# Patient Record
Sex: Female | Born: 1950 | Race: Black or African American | Hispanic: No | Marital: Single | Smoking: Former smoker
Health system: Southern US, Community
[De-identification: ages and names within clinical notes are randomized; demographics above are authoritative.]

## PROBLEM LIST (undated history)

## (undated) DIAGNOSIS — K219 Gastro-esophageal reflux disease without esophagitis: Secondary | ICD-10-CM

## (undated) DIAGNOSIS — M51379 Other intervertebral disc degeneration, lumbosacral region without mention of lumbar back pain or lower extremity pain: Secondary | ICD-10-CM

## (undated) DIAGNOSIS — R0600 Dyspnea, unspecified: Secondary | ICD-10-CM

## (undated) DIAGNOSIS — M4800 Spinal stenosis, site unspecified: Secondary | ICD-10-CM

## (undated) DIAGNOSIS — E119 Type 2 diabetes mellitus without complications: Secondary | ICD-10-CM

## (undated) DIAGNOSIS — M5137 Other intervertebral disc degeneration, lumbosacral region: Secondary | ICD-10-CM

## (undated) DIAGNOSIS — I10 Essential (primary) hypertension: Secondary | ICD-10-CM

## (undated) DIAGNOSIS — R609 Edema, unspecified: Secondary | ICD-10-CM

## (undated) DIAGNOSIS — Z87442 Personal history of urinary calculi: Secondary | ICD-10-CM

## (undated) DIAGNOSIS — M545 Low back pain, unspecified: Secondary | ICD-10-CM

## (undated) DIAGNOSIS — E785 Hyperlipidemia, unspecified: Secondary | ICD-10-CM

## (undated) DIAGNOSIS — D649 Anemia, unspecified: Secondary | ICD-10-CM

## (undated) DIAGNOSIS — M1990 Unspecified osteoarthritis, unspecified site: Secondary | ICD-10-CM

## (undated) DIAGNOSIS — K6289 Other specified diseases of anus and rectum: Secondary | ICD-10-CM

## (undated) DIAGNOSIS — J449 Chronic obstructive pulmonary disease, unspecified: Secondary | ICD-10-CM

## (undated) HISTORY — DX: Spinal stenosis, site unspecified: M48.00

## (undated) HISTORY — DX: Type 2 diabetes mellitus without complications: E11.9

## (undated) HISTORY — PX: LUMBAR EPIDURAL INJECTION: SHX1980

## (undated) HISTORY — DX: Other intervertebral disc degeneration, lumbosacral region: M51.37

## (undated) HISTORY — DX: Other intervertebral disc degeneration, lumbosacral region without mention of lumbar back pain or lower extremity pain: M51.379

## (undated) HISTORY — DX: Hyperlipidemia, unspecified: E78.5

## (undated) HISTORY — PX: REDUCTION MAMMAPLASTY: SUR839

## (undated) HISTORY — DX: Other specified diseases of anus and rectum: K62.89

## (undated) HISTORY — DX: Essential (primary) hypertension: I10

## (undated) HISTORY — DX: Low back pain: M54.5

## (undated) HISTORY — PX: OTHER SURGICAL HISTORY: SHX169

## (undated) HISTORY — PX: KNEE SURGERY: SHX244

## (undated) HISTORY — DX: Low back pain, unspecified: M54.50

---

## 1988-04-23 HISTORY — PX: OTHER SURGICAL HISTORY: SHX169

## 1998-02-22 ENCOUNTER — Other Ambulatory Visit: Admission: RE | Admit: 1998-02-22 | Discharge: 1998-02-22 | Payer: Self-pay | Admitting: *Deleted

## 1999-04-13 ENCOUNTER — Other Ambulatory Visit: Admission: RE | Admit: 1999-04-13 | Discharge: 1999-04-13 | Payer: Self-pay | Admitting: *Deleted

## 2002-10-12 ENCOUNTER — Other Ambulatory Visit: Admission: RE | Admit: 2002-10-12 | Discharge: 2002-10-12 | Payer: Self-pay | Admitting: *Deleted

## 2002-12-23 HISTORY — PX: BREAST REDUCTION SURGERY: SHX8

## 2003-01-11 ENCOUNTER — Ambulatory Visit (HOSPITAL_BASED_OUTPATIENT_CLINIC_OR_DEPARTMENT_OTHER): Admission: RE | Admit: 2003-01-11 | Discharge: 2003-01-12 | Payer: Self-pay | Admitting: Specialist

## 2003-01-12 ENCOUNTER — Encounter (INDEPENDENT_AMBULATORY_CARE_PROVIDER_SITE_OTHER): Payer: Self-pay | Admitting: Specialist

## 2003-01-12 ENCOUNTER — Emergency Department (HOSPITAL_COMMUNITY): Admission: EM | Admit: 2003-01-12 | Discharge: 2003-01-12 | Payer: Self-pay | Admitting: Emergency Medicine

## 2003-09-14 ENCOUNTER — Encounter: Admission: RE | Admit: 2003-09-14 | Discharge: 2003-12-13 | Payer: Self-pay | Admitting: Orthopedic Surgery

## 2004-03-31 ENCOUNTER — Ambulatory Visit (HOSPITAL_COMMUNITY): Admission: RE | Admit: 2004-03-31 | Discharge: 2004-03-31 | Payer: Self-pay | Admitting: Obstetrics

## 2005-03-29 ENCOUNTER — Encounter: Admission: RE | Admit: 2005-03-29 | Discharge: 2005-03-29 | Payer: Self-pay | Admitting: Obstetrics

## 2006-04-10 ENCOUNTER — Ambulatory Visit (HOSPITAL_COMMUNITY): Admission: RE | Admit: 2006-04-10 | Discharge: 2006-04-10 | Payer: Self-pay | Admitting: Obstetrics

## 2007-05-28 ENCOUNTER — Ambulatory Visit (HOSPITAL_COMMUNITY): Admission: RE | Admit: 2007-05-28 | Discharge: 2007-05-28 | Payer: Self-pay | Admitting: Obstetrics

## 2007-09-22 ENCOUNTER — Ambulatory Visit (HOSPITAL_COMMUNITY): Admission: RE | Admit: 2007-09-22 | Discharge: 2007-09-22 | Payer: Self-pay | Admitting: Obstetrics

## 2010-03-21 ENCOUNTER — Ambulatory Visit (HOSPITAL_COMMUNITY)
Admission: RE | Admit: 2010-03-21 | Discharge: 2010-03-21 | Payer: Self-pay | Source: Home / Self Care | Admitting: Family Medicine

## 2010-09-08 NOTE — Op Note (Signed)
NAME:  DELARA, SHEPHEARD NO.:  [id number]   MEDICAL RECORD NO.:  [id number]                   PATIENT TYPE:  AMB   LOCATION:  DSC                                  FACILITY:  [facility]   PHYSICIAN:  Earvin Hansen L. Shon Hough, M.D.           DATE OF BIRTH:  Apr 25, 1950   DATE OF PROCEDURE:  01/11/2003  DATE OF DISCHARGE:                                 OPERATIVE REPORT   INDICATION:  This is a 60 year old lady with severe macromastia, back and  shoulder pain secondary to large pendulous breasts, history of intertrigo,  pitting of both shoulders and accessory breast tissue.   PROCEDURES DONE:  Bilateral breast reductions and reduction of accessory  breast tissue.   SURGEON:  Yaakov Guthrie. Shon Hough, M.D.   ASSISTANT:  Alethia Berthold, CFA, C-PAC.   ANESTHESIA:  General.   PROCEDURE:  The patient was sat up and drawn for the inferior pedicle  reduction mammoplasties, remarking the nipple-areolar complexes back to 20  cm from the suprasternal notch.  She then underwent general anesthesia and  intubated orally.  Prep was done to the chest and breast areas in a routine  fashion using Betadine soaping solution and walled off with sterile towels  and drapes so as to make a sterile field.  Xylocaine 0.25% with epinephrine  was injected locally for vasoconstriction, a total of 200 mL, 1:400,000  concentration.  After sterile drapes had been placed, the wounds were scored  with #15 blades and the skin over the inferior pedicles was de-  epithelialized with #20 blades.  The medial and lateral fatty dermal  pedicles were excised down to underlying fascia.  Out laterally, more tissue  was taken and accessory breast tissue excised sharply using the Bovie unit  on coagulation.  After proper hemostasis, the new keyhole areas were  debulked and then the flaps were transposed and stayed with 3-0 Prolene  suture with excellent symmetry.  The same procedure was carried out on both  sides.  Subcutaneous closure was done with 3-0 Monocryl x2 layers and a  running subcuticular suture of 3-0 Monocryl and 5-0 Monocryl throughout the  inverted T's.  The wounds were drained with #10 Blake drains, which were  placed in the depths of the wounds and brought out through the lateral-most  portions of the incisions and secured with 3-0 Prolene.  The wounds were  cleansed.  Steri-Strips and soft dressings were applied including Xeroform,  4 x 4's, ABDs and Hypafix tape.  Estimated blood loss -- 150 mL.  Complications -- none.  At the end of the procedure the nipple-areolar  complexes were examined, showing good suppleness and blood supply.  She was  then taken to recovery in excellent condition.  Yaakov Guthrie. Shon Hough, M.D.    Cathie Hoops  D:  01/11/2003  T:  01/11/2003  Job:  [id number]

## 2011-03-28 ENCOUNTER — Other Ambulatory Visit: Payer: Self-pay | Admitting: Obstetrics

## 2011-03-28 DIAGNOSIS — Z1231 Encounter for screening mammogram for malignant neoplasm of breast: Secondary | ICD-10-CM

## 2011-05-03 ENCOUNTER — Ambulatory Visit (HOSPITAL_COMMUNITY)
Admission: RE | Admit: 2011-05-03 | Discharge: 2011-05-03 | Disposition: A | Discharge status: Home / Self Care | Payer: No Typology Code available for payment source | Source: Ambulatory Visit | Attending: Obstetrics | Admitting: Obstetrics

## 2011-05-03 DIAGNOSIS — Z1231 Encounter for screening mammogram for malignant neoplasm of breast: Secondary | ICD-10-CM | POA: Insufficient documentation

## 2012-02-19 ENCOUNTER — Other Ambulatory Visit (HOSPITAL_COMMUNITY): Payer: Self-pay | Admitting: Endocrinology

## 2012-02-19 DIAGNOSIS — Z1231 Encounter for screening mammogram for malignant neoplasm of breast: Secondary | ICD-10-CM

## 2012-03-06 ENCOUNTER — Encounter: Payer: Self-pay | Admitting: Internal Medicine

## 2012-04-18 ENCOUNTER — Ambulatory Visit (AMBULATORY_SURGERY_CENTER): Payer: No Typology Code available for payment source

## 2012-04-18 VITALS — Ht 67.0 in | Wt 253.0 lb

## 2012-04-18 DIAGNOSIS — Z1211 Encounter for screening for malignant neoplasm of colon: Secondary | ICD-10-CM

## 2012-04-18 MED ORDER — MOVIPREP 100 G PO SOLR: 1.0000 | Freq: Once | ORAL | Status: DC

## 2012-05-01 ENCOUNTER — Telehealth: Payer: Self-pay | Admitting: Internal Medicine

## 2012-05-01 NOTE — Telephone Encounter (Signed)
Pt instructed to hold Diovan/HCT and Metformin day of procedure.

## 2012-05-02 ENCOUNTER — Encounter: Payer: Self-pay | Admitting: Internal Medicine

## 2012-05-02 ENCOUNTER — Ambulatory Visit (AMBULATORY_SURGERY_CENTER): Payer: No Typology Code available for payment source | Admitting: Internal Medicine

## 2012-05-02 VITALS — BP 118/90 | HR 86 | Temp 97.3°F | Resp 22 | Ht 67.0 in | Wt 253.0 lb

## 2012-05-02 DIAGNOSIS — Z1211 Encounter for screening for malignant neoplasm of colon: Secondary | ICD-10-CM

## 2012-05-02 DIAGNOSIS — D126 Benign neoplasm of colon, unspecified: Secondary | ICD-10-CM

## 2012-05-02 LAB — GLUCOSE, CAPILLARY
Glucose-Capillary: 118 mg/dL — ABNORMAL HIGH (ref 70–99)
Glucose-Capillary: 106 mg/dL — ABNORMAL HIGH (ref 70–99)
Glucose-Capillary: 109 mg/dL — ABNORMAL HIGH (ref 70–99)

## 2012-05-02 MED ORDER — SODIUM CHLORIDE 0.9 % IV SOLN: 500.0000 mL | INTRAVENOUS | Status: DC

## 2012-05-02 NOTE — Patient Instructions (Addendum)

## 2012-05-02 NOTE — Progress Notes (Signed)
1102 a/ox3 pleased with MAC report to April RN

## 2012-05-02 NOTE — Op Note (Signed)
 [facility] [address] [place], [address]   COLONOSCOPY PROCEDURE REPORT  PATIENT: Stephanie, Fox  MR#: [id number] BIRTHDATE: January 29, 1951 , 61  yrs. old GENDER: Female ENDOSCOPIST: Hart Carwin, MD REFERRED BY:  Coral Ceo, M.D. , Dr Talmage Nap PROCEDURE DATE:  05/02/2012 PROCEDURE:   Colonoscopy with snare polypectomy ASA CLASS:   Class II INDICATIONS:Average risk patient for colon cancer. MEDICATIONS: MAC sedation, administered by CRNA, Propofol (Diprivan), and Propofol (Diprivan) 180 mg IV  DESCRIPTION OF PROCEDURE:   After the risks and benefits and of the procedure were explained, informed consent was obtained.  A digital rectal exam revealed no abnormalities of the rectum.    The LB [id number]-[id number]  endoscope was introduced through the anus and advanced to the cecum, which was identified by both the appendix and ileocecal valve .  The quality of the prep was good, using MoviPrep .  The instrument was then slowly withdrawn as the colon was fully examined.     COLON FINDINGS: A flat polyp ranging between 3-66mm in size was found in the sigmoid colon.  A polypectomy was performed with a cold snare.  The resection was complete and the polyp tissue was completely retrieved.     Retroflexed views revealed no abnormalities.     The scope was then withdrawn from the patient and the procedure completed.  COMPLICATIONS: There were no complications. ENDOSCOPIC IMPRESSION: Flat polyp ranging between 3-29mm in size was found in the sigmoid colon; polypectomy was performed with a cold snare  RECOMMENDATIONS: 1.  Await pathology results 2.  High fiber diet   REPEAT EXAM: for Colonoscopy. depending on the polyp pathology  cc:  _______________________________ eSigned:  Hart Carwin, MD 05/02/2012 11:04 AM

## 2012-05-02 NOTE — Progress Notes (Signed)
Patient did not experience any of the following events: a burn prior to discharge; a fall within the facility; wrong site/side/patient/procedure/implant event; or a hospital transfer or hospital admission upon discharge from the facility. (G8907) Patient did not have preoperative order for IV antibiotic SSI prophylaxis. (G8918)  

## 2012-05-05 ENCOUNTER — Telehealth: Payer: Self-pay | Admitting: *Deleted

## 2012-05-05 ENCOUNTER — Ambulatory Visit (HOSPITAL_COMMUNITY)
Admission: RE | Admit: 2012-05-05 | Discharge: 2012-05-05 | Disposition: A | Discharge status: Home / Self Care | Payer: No Typology Code available for payment source | Source: Ambulatory Visit | Attending: Endocrinology | Admitting: Endocrinology

## 2012-05-05 DIAGNOSIS — Z1231 Encounter for screening mammogram for malignant neoplasm of breast: Secondary | ICD-10-CM | POA: Insufficient documentation

## 2012-05-05 NOTE — Telephone Encounter (Signed)
  Follow up Call-  Call back number 05/02/2012  Post procedure Call Back phone  # [phone] or  [phone]  Permission to leave phone message Yes     Patient questions:  Do you have a fever, pain , or abdominal swelling? no Pain Score  0 *  Have you tolerated food without any problems? yes  Have you been able to return to your normal activities? yes  Do you have any questions about your discharge instructions: Diet   no Medications  no Follow up visit  no  Do you have questions or concerns about your Care? yes  Actions: * If pain score is 4 or above: No action needed, pain <4.

## 2012-05-06 ENCOUNTER — Encounter: Payer: Self-pay | Admitting: Internal Medicine

## 2013-01-05 ENCOUNTER — Other Ambulatory Visit: Payer: Self-pay | Admitting: Obstetrics

## 2013-01-05 ENCOUNTER — Encounter: Payer: Self-pay | Admitting: Obstetrics

## 2013-01-05 DIAGNOSIS — K819 Cholecystitis, unspecified: Secondary | ICD-10-CM

## 2013-01-06 ENCOUNTER — Ambulatory Visit: Payer: Self-pay | Admitting: Obstetrics

## 2013-01-09 ENCOUNTER — Encounter (INDEPENDENT_AMBULATORY_CARE_PROVIDER_SITE_OTHER): Payer: Self-pay | Admitting: General Surgery

## 2013-01-09 ENCOUNTER — Ambulatory Visit (INDEPENDENT_AMBULATORY_CARE_PROVIDER_SITE_OTHER): Payer: No Typology Code available for payment source | Admitting: General Surgery

## 2013-01-09 VITALS — BP 128/82 | HR 84 | Temp 98.6°F | Ht 67.0 in | Wt 252.0 lb

## 2013-01-09 DIAGNOSIS — R1011 Right upper quadrant pain: Secondary | ICD-10-CM

## 2013-01-09 NOTE — Patient Instructions (Signed)
I will give you a call after your ultrasound so we may discuss the results

## 2013-01-09 NOTE — Progress Notes (Signed)
Patient ID: Stephanie Fox, female   DOB: 10-Jul-1950, 62 y.o.   MRN: [id number]  Chief Complaint  Patient presents with  . Abdominal Pain    HPI Stephanie Fox is a 62 y.o. female.  Chief complaint: Right upper quadrant abdominal pain and reflux HPI A few weeks ago the patient developed pain in her left ankle. This may be difficult for her to take a shower for a couple of days. Shortly thereafter, she developed pain in the right side of her abdomen. It started low and localized in her right upper quadrant. It lasted for a few days. It resolved after taking some ibuprofen. The pain was sharp and exacerbated by deep inspiration. Laying on her left side also made it worse. In the interim, the pain has not returned, however, she has increased symptoms of heartburn and reflux. I was asked to see her in consultation by Dr. Coral Ceo regarding possible gallbladder disease. Past Medical History  Diagnosis Date  . Diabetes mellitus   . Hypertension   . Hyperlipidemia     Past Surgical History  Procedure Laterality Date  . Breast reduction surgery    . Thumb surgery    . Underarm surgery    . Knee surgery      Family History  Problem Relation Age of Onset  . Colon cancer Neg Hx     Social History History  Substance Use Topics  . Smoking status: Current Every Day Smoker -- 0.25 packs/day    Types: Cigarettes  . Smokeless tobacco: Not on file  . Alcohol Use: No    No Known Allergies  Current Outpatient Prescriptions  Medication Sig Dispense Refill  . atorvastatin (LIPITOR) 10 MG tablet Take 10 mg by mouth daily.      . benzonatate (TESSALON) 100 MG capsule       . Capsicum, Cayenne, (CAYENNE PO) Take by mouth.      . Cholecalciferol (VITAMIN D3) 2000 UNITS TABS Take by mouth.      Marland Kitchen CINNAMON PO Take 1,000 mg by mouth 2 (two) times daily.      . furosemide (LASIX) 40 MG tablet Take 40 mg by mouth as needed.      . metFORMIN (GLUCOPHAGE) 500 MG tablet Take 500 mg by mouth 2 (two)  times daily with a meal.      . Multiple Vitamin (MULTIVITAMIN) tablet Take 1 tablet by mouth daily.      . Multiple Vitamins-Minerals (HAIR VITAMINS PO) Take by mouth 2 (two) times daily.      . valsartan-hydrochlorothiazide (DIOVAN-HCT) 80-12.5 MG per tablet Take 1 tablet by mouth daily.       No current facility-administered medications for this visit.    Review of Systems Review of Systems  Constitutional: Negative for fever, chills and unexpected weight change.  HENT: Negative for hearing loss, congestion, sore throat, trouble swallowing and voice change.   Eyes: Negative for visual disturbance.  Respiratory: Negative for cough and wheezing.   Cardiovascular: Negative for chest pain, palpitations and leg swelling.  Gastrointestinal: Positive for abdominal pain. Negative for nausea, vomiting, diarrhea, constipation, blood in stool, abdominal distention and anal bleeding.       See history of present illness  Genitourinary: Negative for hematuria, vaginal bleeding and difficulty urinating.  Musculoskeletal: Negative for arthralgias.       Recent left ankle pain which resolved  Skin: Negative for rash and wound.  Neurological: Negative for seizures, syncope and headaches.  Hematological: Negative for adenopathy. Does not  bruise/bleed easily.  Psychiatric/Behavioral: Negative for confusion.    Blood pressure 128/82, pulse 84, temperature 98.6 F (37 C), temperature source Oral, height 5\' 7"  (1.702 m), weight 252 lb (114.306 kg).  Physical Exam Physical Exam  Constitutional: She is oriented to person, place, and time. She appears well-developed and well-nourished. No distress.  HENT:  Head: Normocephalic and atraumatic.  Nose: Nose normal.  Mouth/Throat: Oropharynx is clear and moist. No oropharyngeal exudate.  Eyes: EOM are normal. Pupils are equal, round, and reactive to light. No scleral icterus.  Neck: Normal range of motion. Neck supple. No tracheal deviation present.   Cardiovascular: Normal rate, regular rhythm, normal heart sounds and intact distal pulses.   Pulmonary/Chest: Breath sounds normal. No stridor. No respiratory distress. She has no wheezes. She has no rales. She exhibits no tenderness.  Abdominal: Soft. She exhibits no distension. There is tenderness. There is no rebound and no guarding.  Minimal tenderness to deep palpation right upper quadrant  Musculoskeletal: Normal range of motion. She exhibits no edema and no tenderness.  Neurological: She is alert and oriented to person, place, and time. She exhibits normal muscle tone. Coordination normal.  Skin: Skin is warm.  Psychiatric: She has a normal mood and affect.    Data Reviewed N/A  Assessment    Right upper quadrant abdominal pain, possible biliary colic With associated reflux symptoms    Plan    I will send her for abdominal ultrasound to evaluate for gallstones. I will call her with the results and we will discuss further plans at that time. We did briefly discuss gallbladder surgery. I also gave her some literature to read. We will speak on the phone after her ultrasound and make plans accordingly.       Aysha Livecchi E 01/09/2013, 3:06 PM

## 2013-01-13 ENCOUNTER — Ambulatory Visit
Admission: RE | Admit: 2013-01-13 | Discharge: 2013-01-13 | Disposition: A | Discharge status: Home / Self Care | Payer: No Typology Code available for payment source | Source: Ambulatory Visit | Attending: General Surgery | Admitting: General Surgery

## 2013-01-13 ENCOUNTER — Other Ambulatory Visit: Payer: Self-pay | Admitting: General Surgery

## 2013-01-13 DIAGNOSIS — R1011 Right upper quadrant pain: Secondary | ICD-10-CM

## 2013-01-20 ENCOUNTER — Encounter (HOSPITAL_COMMUNITY)
Admission: RE | Admit: 2013-01-20 | Discharge: 2013-01-20 | Disposition: A | Discharge status: Home / Self Care | Payer: No Typology Code available for payment source | Source: Ambulatory Visit | Attending: General Surgery | Admitting: General Surgery

## 2013-01-20 ENCOUNTER — Telehealth: Payer: Self-pay | Admitting: General Surgery

## 2013-01-20 DIAGNOSIS — R1011 Right upper quadrant pain: Secondary | ICD-10-CM | POA: Insufficient documentation

## 2013-01-20 MED ORDER — TECHNETIUM TC 99M MEBROFENIN IV KIT
5.0000 | PACK | Freq: Once | INTRAVENOUS | Status: AC | PRN
  Administered 2013-01-20: 5 via INTRAVENOUS

## 2013-01-20 NOTE — Telephone Encounter (Signed)
Left MOM with HIDA results

## 2013-06-03 ENCOUNTER — Other Ambulatory Visit: Payer: Self-pay | Admitting: Obstetrics

## 2013-06-03 DIAGNOSIS — Z1231 Encounter for screening mammogram for malignant neoplasm of breast: Secondary | ICD-10-CM

## 2013-06-17 ENCOUNTER — Ambulatory Visit (HOSPITAL_COMMUNITY)
Admission: RE | Admit: 2013-06-17 | Discharge: 2013-06-17 | Disposition: A | Discharge status: Home / Self Care | Payer: PRIVATE HEALTH INSURANCE | Source: Ambulatory Visit | Attending: Obstetrics | Admitting: Obstetrics

## 2013-06-17 ENCOUNTER — Encounter: Payer: Self-pay | Admitting: Obstetrics

## 2013-06-17 ENCOUNTER — Ambulatory Visit (INDEPENDENT_AMBULATORY_CARE_PROVIDER_SITE_OTHER): Payer: PRIVATE HEALTH INSURANCE | Admitting: Obstetrics

## 2013-06-17 VITALS — BP 104/74 | HR 87 | Temp 98.7°F | Ht 67.0 in | Wt 246.0 lb

## 2013-06-17 DIAGNOSIS — Z0000 Encounter for general adult medical examination without abnormal findings: Secondary | ICD-10-CM

## 2013-06-17 DIAGNOSIS — Z1231 Encounter for screening mammogram for malignant neoplasm of breast: Secondary | ICD-10-CM | POA: Insufficient documentation

## 2013-06-17 DIAGNOSIS — Z01419 Encounter for gynecological examination (general) (routine) without abnormal findings: Secondary | ICD-10-CM

## 2013-06-17 DIAGNOSIS — Z780 Asymptomatic menopausal state: Secondary | ICD-10-CM

## 2013-06-17 NOTE — Progress Notes (Signed)
Subjective:     Stephanie Fox is a 63 y.o. female here for a routine exam.  Current complaints: Patient is in the office today for an Annual Exam. Patient states she is taking a new medication Ivokana. and would like to make sure that she does not have a yeast infection or a UTI. Patient states has some slight itching. Patient denies any burning, irritation, vaginal odor or discharge.  Personal health questionnaire reviewed: yes.   Gynecologic History No LMP recorded. Patient is postmenopausal. Contraception: post menopausal status Last Pap: 05/07/2012. Results were: normal Last mammogram: 04/2012. Results were: normal  Obstetric History OB History  No data available     The following portions of the patient's history were reviewed and updated as appropriate: allergies, current medications, past family history, past medical history, past social history, past surgical history and problem list.  Review of Systems Pertinent items are noted in HPI.    Objective:    General appearance: alert and no distress Breasts: normal appearance, no masses or tenderness Abdomen: normal findings: soft, non-tender Pelvic: cervix normal in appearance, external genitalia normal, no adnexal masses or tenderness, no cervical motion tenderness, rectovaginal septum normal, uterus normal size, shape, and consistency and vagina normal without discharge    Assessment:    Healthy female exam.    Plan:    Education reviewed: calcium supplements, low fat, low cholesterol diet, safe sex/STD prevention, self breast exams, weight bearing exercise and Bone Density testing. Mammogram ordered. Follow up in: 1 year. Bone Density Study scheduled.

## 2013-06-18 LAB — PAP IG W/ RFLX HPV ASCU

## 2013-06-18 LAB — WET PREP BY MOLECULAR PROBE
CANDIDA SPECIES: NEGATIVE
Gardnerella vaginalis: NEGATIVE
TRICHOMONAS VAG: NEGATIVE

## 2013-06-19 LAB — URINE CULTURE
Colony Count: NO GROWTH
Organism ID, Bacteria: NO GROWTH

## 2014-02-22 ENCOUNTER — Encounter: Payer: Self-pay | Admitting: Obstetrics

## 2014-05-20 ENCOUNTER — Other Ambulatory Visit: Payer: Self-pay | Admitting: Obstetrics

## 2014-05-20 DIAGNOSIS — Z1231 Encounter for screening mammogram for malignant neoplasm of breast: Secondary | ICD-10-CM

## 2014-06-17 ENCOUNTER — Ambulatory Visit: Payer: PRIVATE HEALTH INSURANCE | Admitting: Obstetrics

## 2014-06-18 ENCOUNTER — Ambulatory Visit (INDEPENDENT_AMBULATORY_CARE_PROVIDER_SITE_OTHER): Payer: PRIVATE HEALTH INSURANCE | Admitting: Obstetrics

## 2014-06-18 ENCOUNTER — Ambulatory Visit (HOSPITAL_COMMUNITY)
Admission: RE | Admit: 2014-06-18 | Discharge: 2014-06-18 | Disposition: A | Discharge status: Home / Self Care | Payer: PRIVATE HEALTH INSURANCE | Source: Ambulatory Visit | Attending: Obstetrics | Admitting: Obstetrics

## 2014-06-18 ENCOUNTER — Encounter: Payer: Self-pay | Admitting: Obstetrics

## 2014-06-18 VITALS — BP 130/87 | HR 93 | Temp 98.4°F | Ht 67.0 in | Wt 265.0 lb

## 2014-06-18 DIAGNOSIS — Z1231 Encounter for screening mammogram for malignant neoplasm of breast: Secondary | ICD-10-CM | POA: Diagnosis not present

## 2014-06-18 DIAGNOSIS — Z01419 Encounter for gynecological examination (general) (routine) without abnormal findings: Secondary | ICD-10-CM

## 2014-06-18 NOTE — Progress Notes (Signed)
Subjective:        Stephanie Fox is a 64 y.o. female here for a routine exam.  Current complaints: None.    Personal health questionnaire:  Is patient [name], have a family history of breast and/or ovarian cancer: no Is there a family history of uterine cancer diagnosed at age < 88, gastrointestinal cancer, urinary tract cancer, family member who is a Field seismologist syndrome-associated carrier: no Is the patient overweight and hypertensive, family history of diabetes, personal history of gestational diabetes, preeclampsia or PCOS: no Is patient over 42, have PCOS,  family history of premature CHD under age 52, diabetes, smoke, have hypertension or peripheral artery disease:  no At any time, has a partner hit, kicked or otherwise hurt or frightened you?: no Over the past 2 weeks, have you felt down, depressed or hopeless?: no Over the past 2 weeks, have you felt little interest or pleasure in doing things?:no   Gynecologic History No LMP recorded. Patient is postmenopausal. Contraception: post menopausal status Last Pap: 2015. Results were: normal Last mammogram: 2015. Results were: normal  Obstetric History OB History  Gravida Para Term Preterm AB SAB TAB Ectopic Multiple Living  2 0 0 0 2 0 2 0 0 0     # Outcome Date GA Lbr Len/2nd Weight Sex Delivery Anes PTL Lv  2 TAB           1 TAB               Past Medical History  Diagnosis Date  . Diabetes mellitus   . Hypertension   . Hyperlipidemia     Past Surgical History  Procedure Laterality Date  . Breast reduction surgery    . Thumb surgery    . Underarm surgery    . Knee surgery       Current outpatient prescriptions:  .  benzonatate (TESSALON) 100 MG capsule, , Disp: , Rfl:  .  Cholecalciferol (VITAMIN D3) 2000 UNITS TABS, Take by mouth., Disp: , Rfl:  .  furosemide (LASIX) 40 MG tablet, Take 40 mg by mouth as needed., Disp: , Rfl:  .  metFORMIN (GLUCOPHAGE) 500 MG tablet, Take 500 mg by mouth 2 (two) times  daily with a meal., Disp: , Rfl:  .  Multiple Vitamin (MULTIVITAMIN) tablet, Take 1 tablet by mouth daily., Disp: , Rfl:  .  valsartan-hydrochlorothiazide (DIOVAN-HCT) 80-12.5 MG per tablet, Take 1 tablet by mouth daily., Disp: , Rfl:  No Known Allergies  History  Substance Use Topics  . Smoking status: Current Every Day Smoker -- 0.25 packs/day    Types: Cigarettes  . Smokeless tobacco: Never Used  . Alcohol Use: No    Family History  Problem Relation Age of Onset  . Colon cancer Neg Hx       Review of Systems  Constitutional: negative for fatigue and weight loss Respiratory: negative for cough and wheezing Cardiovascular: negative for chest pain, fatigue and palpitations Gastrointestinal: negative for abdominal pain and change in bowel habits Musculoskeletal:negative for myalgias Neurological: negative for gait problems and tremors Behavioral/Psych: negative for abusive relationship, depression Endocrine: negative for temperature intolerance   Genitourinary:negative for abnormal menstrual periods, genital lesions, hot flashes, sexual problems and vaginal discharge Integument/breast: negative for breast lump, breast tenderness, nipple discharge and skin lesion(s)    Objective:       BP 130/87 mmHg  Pulse 93  Temp(Src) 98.4 F (36.9 C)  Ht 5\' 7"  (1.702 m)  Wt 265 lb (120.203 kg)  BMI 41.50 kg/m2 General:   alert  Skin:   no rash or abnormalities  Lungs:   clear to auscultation bilaterally  Heart:   regular rate and rhythm, S1, S2 normal, no murmur, click, rub or gallop  Breasts:   normal without suspicious masses, skin or nipple changes or axillary nodes  Abdomen:  normal findings: no organomegaly, soft, non-tender and no hernia  Pelvis:  External genitalia: normal general appearance Urinary system: urethral meatus normal and bladder without fullness, nontender Vaginal: normal without tenderness, induration or masses Cervix: normal appearance Adnexa: normal bimanual  exam Uterus: anteverted and non-tender, normal size   Lab Review Urine pregnancy test Labs reviewed yes Radiologic studies reviewed yes    Assessment:    Healthy female exam.    Plan:    Education reviewed: calcium supplements, low fat, low cholesterol diet, safe sex/STD prevention, self breast exams, smoking cessation and weight bearing exercise. Mammogram ordered. Follow up in: 1 year.   No orders of the defined types were placed in this encounter.   No orders of the defined types were placed in this encounter.

## 2014-06-18 NOTE — Addendum Note (Signed)
Addended by: Ladona Ridgel on: 06/18/2014 12:00 PM   Modules accepted: Orders

## 2014-06-21 LAB — PAP IG AND HPV HIGH-RISK: HPV DNA HIGH RISK: NOT DETECTED

## 2015-04-24 HISTORY — PX: JOINT REPLACEMENT: SHX530

## 2015-06-02 ENCOUNTER — Other Ambulatory Visit: Payer: Self-pay

## 2015-06-02 DIAGNOSIS — Z1231 Encounter for screening mammogram for malignant neoplasm of breast: Secondary | ICD-10-CM

## 2015-06-28 ENCOUNTER — Ambulatory Visit
Admission: RE | Admit: 2015-06-28 | Discharge: 2015-06-28 | Disposition: A | Discharge status: Home / Self Care | Payer: No Typology Code available for payment source | Source: Ambulatory Visit

## 2015-06-28 DIAGNOSIS — Z1231 Encounter for screening mammogram for malignant neoplasm of breast: Secondary | ICD-10-CM

## 2015-07-21 ENCOUNTER — Ambulatory Visit (INDEPENDENT_AMBULATORY_CARE_PROVIDER_SITE_OTHER): Payer: No Typology Code available for payment source | Admitting: Obstetrics

## 2015-07-21 ENCOUNTER — Encounter: Payer: Self-pay | Admitting: Obstetrics

## 2015-07-21 VITALS — BP 113/76 | HR 88 | Ht 65.0 in | Wt 244.0 lb

## 2015-07-21 DIAGNOSIS — Z01419 Encounter for gynecological examination (general) (routine) without abnormal findings: Secondary | ICD-10-CM | POA: Diagnosis not present

## 2015-07-21 NOTE — Progress Notes (Signed)
Subjective:        Stephanie Fox is a 65 y.o. female here for a routine exam.  Current complaints: None.    Personal health questionnaire:  Is patient [name], have a family history of breast and/or ovarian cancer: no Is there a family history of uterine cancer diagnosed at age < 28, gastrointestinal cancer, urinary tract cancer, family member who is a Field seismologist syndrome-associated carrier: no Is the patient overweight and hypertensive, family history of diabetes, personal history of gestational diabetes, preeclampsia or PCOS: no Is patient over 33, have PCOS,  family history of premature CHD under age 1, diabetes, smoke, have hypertension or peripheral artery disease:  no At any time, has a partner hit, kicked or otherwise hurt or frightened you?: no Over the past 2 weeks, have you felt down, depressed or hopeless?: no Over the past 2 weeks, have you felt little interest or pleasure in doing things?:no   Gynecologic History No LMP recorded. Patient is postmenopausal. Contraception: post menopausal status Last Pap: 2016. Results were: normal Last mammogram: 2017. Results were: normal  Obstetric History OB History  Gravida Para Term Preterm AB SAB TAB Ectopic Multiple Living  2 0 0 0 2 0 2 0 0 0     # Outcome Date GA Lbr Len/2nd Weight Sex Delivery Anes PTL Lv  2 TAB           1 TAB               Past Medical History  Diagnosis Date  . Diabetes mellitus ([place])   . Hypertension   . Hyperlipidemia     Past Surgical History  Procedure Laterality Date  . Breast reduction surgery    . Thumb surgery    . Underarm surgery    . Knee surgery       Current outpatient prescriptions:  .  benzonatate (TESSALON) 100 MG capsule, , Disp: , Rfl:  .  Cholecalciferol (VITAMIN D3) 2000 UNITS TABS, Take by mouth., Disp: , Rfl:  .  furosemide (LASIX) 40 MG tablet, Take 40 mg by mouth as needed., Disp: , Rfl:  .  Liraglutide (VICTOZA) 18 MG/3ML SOPN, Inject into the skin., Disp: ,  Rfl:  .  metFORMIN (GLUCOPHAGE) 500 MG tablet, Take 500 mg by mouth 2 (two) times daily with a meal., Disp: , Rfl:  .  Omega-3 Fatty Acids (FISH OIL) 1000 MG CAPS, Take by mouth., Disp: , Rfl:  .  valsartan-hydrochlorothiazide (DIOVAN-HCT) 80-12.5 MG per tablet, Take 1 tablet by mouth daily., Disp: , Rfl:  .  Multiple Vitamin (MULTIVITAMIN) tablet, Take 1 tablet by mouth daily. Reported on 07/21/2015, Disp: , Rfl:  No Known Allergies  Social History  Substance Use Topics  . Smoking status: Current Every Day Smoker -- 0.25 packs/day    Types: Cigarettes  . Smokeless tobacco: Never Used  . Alcohol Use: No    Family History  Problem Relation Age of Onset  . Colon cancer Neg Hx       Review of Systems  Constitutional: negative for fatigue and weight loss Respiratory: negative for cough and wheezing Cardiovascular: negative for chest pain, fatigue and palpitations Gastrointestinal: negative for abdominal pain and change in bowel habits Musculoskeletal:negative for myalgias Neurological: negative for gait problems and tremors Behavioral/Psych: negative for abusive relationship, depression Endocrine: negative for temperature intolerance   Genitourinary:negative for abnormal menstrual periods, genital lesions, hot flashes, sexual problems and vaginal discharge Integument/breast: negative for breast lump, breast tenderness, nipple discharge and  skin lesion(s)    Objective:       BP 113/76 mmHg  Pulse 88  Ht 5\' 5"  (1.651 m)  Wt 244 lb (110.678 kg)  BMI 40.60 kg/m2 General:   alert  Skin:   no rash or abnormalities  Lungs:   clear to auscultation bilaterally  Heart:   regular rate and rhythm, S1, S2 normal, no murmur, click, rub or gallop  Breasts:   normal without suspicious masses, skin or nipple changes or axillary nodes  Abdomen:  normal findings: no organomegaly, soft, non-tender and no hernia  Pelvis:  External genitalia: normal general appearance Urinary system: urethral  meatus normal and bladder without fullness, nontender Vaginal: normal without tenderness, induration or masses Cervix: normal appearance Adnexa: normal bimanual exam Uterus: anteverted and non-tender, normal size   Lab Review Urine pregnancy test Labs reviewed yes Radiologic studies reviewed yes    Assessment:    Healthy female exam.    Plan:    Education reviewed: calcium supplements, low fat, low cholesterol diet, safe sex/STD prevention, self breast exams, smoking cessation and weight bearing exercise. Follow up in: 1 year.   Meds ordered this encounter  Medications  . Liraglutide (VICTOZA) 18 MG/3ML SOPN    Sig: Inject into the skin.  . Omega-3 Fatty Acids (FISH OIL) 1000 MG CAPS    Sig: Take by mouth.   No orders of the defined types were placed in this encounter.

## 2015-07-21 NOTE — Addendum Note (Signed)
Addended by: Lewie Loron D on: 07/21/2015 04:11 PM   Modules accepted: Orders

## 2015-07-25 ENCOUNTER — Other Ambulatory Visit: Payer: Self-pay | Admitting: Obstetrics

## 2015-07-26 LAB — NUSWAB VAGINITIS (VG)
CANDIDA GLABRATA, NAA: NEGATIVE
Candida albicans, NAA: NEGATIVE
TRICH VAG BY NAA: NEGATIVE

## 2015-07-26 LAB — PAP IG AND HPV HIGH-RISK
HPV, HIGH-RISK: NEGATIVE
PAP SMEAR COMMENT: 0

## 2016-03-14 NOTE — Progress Notes (Signed)
Scheduling pre op-  Please pl;ace SURGICAL ORDERS IN EPIC  thanks

## 2016-03-28 NOTE — Patient Instructions (Signed)
Stephanie Fox  03/28/2016   Your procedure is scheduled on: 04/06/2016    Report to [facility]  Entrance take [facility]  elevators to 3rd floor to  [place] at    130pm  Call this number if you have problems the morning of surgery [phone]   Remember: ONLY 1 PERSON MAY GO WITH YOU TO SHORT STAY TO GET  READY MORNING OF YOUR SURGERY.  Do not eat food after midnite.  May have clear liquids from 12 midnite unitl 0830am then nothing by mouth.       Take these medicines the morning of surgery with A SIP OF WATER: none  DO NOT TAKE ANY DIABETIC MEDICATIONS DAY OF YOUR SURGERY                               You may not have any metal on your body including hair pins and              piercings  Do not wear jewelry, make-up, lotions, powders or perfumes, deodorant             Do not wear nail polish.  Do not shave  48 hours prior to surgery.                Do not bring valuables to the hospital. [place].  Contacts, dentures or bridgework may not be worn into surgery.  Leave suitcase in the car. After surgery it may be brought to your room.         Special Instructions: N/A              Please read over the following fact sheets you were given: _____________________________________________________________________                CLEAR LIQUID DIET   Foods Allowed                                                                     Foods Excluded  Coffee and tea, regular and decaf                             liquids that you cannot  Plain Jell-O in any flavor                                             see through such as: Fruit ices (not with fruit pulp)                                     milk, soups, orange juice  Iced Popsicles  All solid food Carbonated beverages, regular and diet                                    Cranberry, grape and apple juices Sports  drinks like Gatorade Lightly seasoned clear broth or consume(fat free) Sugar, honey syrup  Sample Menu Breakfast                                Lunch                                     Supper Cranberry juice                    Beef broth                            Chicken broth Jell-O                                     Grape juice                           Apple juice Coffee or tea                        Jell-O                                      Popsicle                                                Coffee or tea                        Coffee or tea  _____________________________________________________________________  [facility] - Preparing for Surgery Before surgery, you can play an important role.  Because skin is not sterile, your skin needs to be as free of germs as possible.  You can reduce the number of germs on your skin by washing with CHG (chlorahexidine gluconate) soap before surgery.  CHG is an antiseptic cleaner which kills germs and bonds with the skin to continue killing germs even after washing. Please DO NOT use if you have an allergy to CHG or antibacterial soaps.  If your skin becomes reddened/irritated stop using the CHG and inform your nurse when you arrive at Short Stay. Do not shave (including legs and underarms) for at least 48 hours prior to the first CHG shower.  You may shave your face/neck. Please follow these instructions carefully:  1.  Shower with CHG Soap the night before surgery and the  morning of Surgery.  2.  If you choose to wash your hair, wash your hair first as usual with your  normal  shampoo.  3.  After you shampoo, rinse your hair and body thoroughly to remove the  shampoo.  4.  Use CHG as you would any other liquid soap.  You can apply chg directly  to the skin and wash                       Gently with a scrungie or clean washcloth.  5.  Apply the CHG Soap to your body ONLY FROM THE NECK DOWN.   Do not use on face/  open                           Wound or open sores. Avoid contact with eyes, ears mouth and genitals (private parts).                       Wash face,  Genitals (private parts) with your normal soap.             6.  Wash thoroughly, paying special attention to the area where your surgery  will be performed.  7.  Thoroughly rinse your body with warm water from the neck down.  8.  DO NOT shower/wash with your normal soap after using and rinsing off  the CHG Soap.                9.  Pat yourself dry with a clean towel.            10.  Wear clean pajamas.            11.  Place clean sheets on your bed the night of your first shower and do not  sleep with pets. Day of Surgery : Do not apply any lotions/deodorants the morning of surgery.  Please wear clean clothes to the hospital/surgery center.  FAILURE TO FOLLOW THESE INSTRUCTIONS MAY RESULT IN THE CANCELLATION OF YOUR SURGERY PATIENT SIGNATURE_________________________________  NURSE SIGNATURE__________________________________  ________________________________________________________________________ How to Manage Your Diabetes Before and After Surgery  Why is it important to control my blood sugar before and after surgery? . Improving blood sugar levels before and after surgery helps healing and can limit problems. . A way of improving blood sugar control is eating a healthy diet by: o  Eating less sugar and carbohydrates o  Increasing activity/exercise o  Talking with your doctor about reaching your blood sugar goals . High blood sugars (greater than 180 mg/dL) can raise your risk of infections and slow your recovery, so you will need to focus on controlling your diabetes during the weeks before surgery. . Make sure that the doctor who takes care of your diabetes knows about your planned surgery including the date and location.  How do I manage my blood sugar before surgery? . Check your blood sugar at least 4 times a day, starting 2 days  before surgery, to make sure that the level is not too high or low. o Check your blood sugar the morning of your surgery when you wake up and every 2 hours until you get to the Short Stay unit. . If your blood sugar is less than 70 mg/dL, you will need to treat for low blood sugar: o Do not take insulin. o Treat a low blood sugar (less than 70 mg/dL) with  cup of clear juice (cranberry or apple), 4 glucose tablets, OR glucose gel. o Recheck blood sugar in 15 minutes after treatment (to make sure it is greater than 70 mg/dL). If your blood sugar is not greater than 70 mg/dL on recheck, call  [phone] for further instructions. . Report your blood sugar to the short stay nurse when you get to Short Stay.  . If you are admitted to the hospital after surgery: o Your blood sugar will be checked by the staff and you will probably be given insulin after surgery (instead of oral diabetes medicines) to make sure you have good blood sugar levels. o The goal for blood sugar control after surgery is 80-180 mg/dL.   WHAT DO I DO ABOUT MY DIABETES MEDICATION?  Marland Kitchen Do not take oral diabetes medicines (pills) the morning of surgery.  . .  Patient Signature:  Date:   Nurse Signature:  Date:   Reviewed and Endorsed by [facility] Patient Education Committee, August 2015 WHAT IS A BLOOD TRANSFUSION? Blood Transfusion Information  A transfusion is the replacement of blood or some of its parts. Blood is made up of multiple cells which provide different functions.  Red blood cells carry oxygen and are used for blood loss replacement.  White blood cells fight against infection.  Platelets control bleeding.  Plasma helps clot blood.  Other blood products are available for specialized needs, such as hemophilia or other clotting disorders. BEFORE THE TRANSFUSION  Who gives blood for transfusions?   Healthy volunteers who are fully evaluated to make sure their blood is safe. This is blood bank  blood. Transfusion therapy is the safest it has ever been in the practice of medicine. Before blood is taken from a donor, a complete history is taken to make sure that person has no history of diseases nor engages in risky social behavior (examples are intravenous drug use or sexual activity with multiple partners). The donor's travel history is screened to minimize risk of transmitting infections, such as malaria. The donated blood is tested for signs of infectious diseases, such as HIV and hepatitis. The blood is then tested to be sure it is compatible with you in order to minimize the chance of a transfusion reaction. If you or a relative donates blood, this is often done in anticipation of surgery and is not appropriate for emergency situations. It takes many days to process the donated blood. RISKS AND COMPLICATIONS Although transfusion therapy is very safe and saves many lives, the main dangers of transfusion include:   Getting an infectious disease.  Developing a transfusion reaction. This is an allergic reaction to something in the blood you were given. Every precaution is taken to prevent this. The decision to have a blood transfusion has been considered carefully by your caregiver before blood is given. Blood is not given unless the benefits outweigh the risks. AFTER THE TRANSFUSION  Right after receiving a blood transfusion, you will usually feel much better and more energetic. This is especially true if your red blood cells have gotten low (anemic). The transfusion raises the level of the red blood cells which carry oxygen, and this usually causes an energy increase.  The nurse administering the transfusion will monitor you carefully for complications. HOME CARE INSTRUCTIONS  No special instructions are needed after a transfusion. You may find your energy is better. Speak with your caregiver about any limitations on activity for underlying diseases you may have. SEEK MEDICAL CARE IF:    Your condition is not improving after your transfusion.  You develop redness or irritation at the intravenous (IV) site. SEEK IMMEDIATE MEDICAL CARE IF:  Any of the following symptoms occur over the next 12 hours:  Shaking chills.  You have a temperature by mouth above  102 F (38.9 C), not controlled by medicine.  Chest, back, or muscle pain.  People around you feel you are not acting correctly or are confused.  Shortness of breath or difficulty breathing.  Dizziness and fainting.  You get a rash or develop hives.  You have a decrease in urine output.  Your urine turns a dark color or changes to pink, red, or brown. Any of the following symptoms occur over the next 10 days:  You have a temperature by mouth above 102 F (38.9 C), not controlled by medicine.  Shortness of breath.  Weakness after normal activity.  The white part of the eye turns yellow (jaundice).  You have a decrease in the amount of urine or are urinating less often.  Your urine turns a dark color or changes to pink, red, or brown. Document Released: 04/06/2000 Document Revised: 07/02/2011 Document Reviewed: 11/24/2007 ExitCare Patient Information 2014 [place].  _______________________________________________________________________  Incentive Spirometer  An incentive spirometer is a tool that can help keep your lungs clear and active. This tool measures how well you are filling your lungs with each breath. Taking long deep breaths may help reverse or decrease the chance of developing breathing (pulmonary) problems (especially infection) following:  A long period of time when you are unable to move or be active. BEFORE THE PROCEDURE   If the spirometer includes an indicator to show your best effort, your nurse or respiratory therapist will set it to a desired goal.  If possible, sit up straight or lean slightly forward. Try not to slouch.  Hold the incentive spirometer in an upright  position. INSTRUCTIONS FOR USE  1. Sit on the edge of your bed if possible, or sit up as far as you can in bed or on a chair. 2. Hold the incentive spirometer in an upright position. 3. Breathe out normally. 4. Place the mouthpiece in your mouth and seal your lips tightly around it. 5. Breathe in slowly and as deeply as possible, raising the piston or the ball toward the top of the column. 6. Hold your breath for 3-5 seconds or for as long as possible. Allow the piston or ball to fall to the bottom of the column. 7. Remove the mouthpiece from your mouth and breathe out normally. 8. Rest for a few seconds and repeat Steps 1 through 7 at least 10 times every 1-2 hours when you are awake. Take your time and take a few normal breaths between deep breaths. 9. The spirometer may include an indicator to show your best effort. Use the indicator as a goal to work toward during each repetition. 10. After each set of 10 deep breaths, practice coughing to be sure your lungs are clear. If you have an incision (the cut made at the time of surgery), support your incision when coughing by placing a pillow or rolled up towels firmly against it. Once you are able to get out of bed, walk around indoors and cough well. You may stop using the incentive spirometer when instructed by your caregiver.  RISKS AND COMPLICATIONS  Take your time so you do not get dizzy or light-headed.  If you are in pain, you may need to take or ask for pain medication before doing incentive spirometry. It is harder to take a deep breath if you are having pain. AFTER USE  Rest and breathe slowly and easily.  It can be helpful to keep track of a log of your progress. Your caregiver can provide you  with a simple table to help with this. If you are using the spirometer at home, follow these instructions: [place] IF:   You are having difficultly using the spirometer.  You have trouble using the spirometer as often as  instructed.  Your pain medication is not giving enough relief while using the spirometer.  You develop fever of 100.5 F (38.1 C) or higher. SEEK IMMEDIATE MEDICAL CARE IF:   You cough up bloody sputum that had not been present before.  You develop fever of 102 F (38.9 C) or greater.  You develop worsening pain at or near the incision site. MAKE SURE YOU:   Understand these instructions.  Will watch your condition.  Will get help right away if you are not doing well or get worse. Document Released: 08/20/2006 Document Revised: 07/02/2011 Document Reviewed: 10/21/2006 [facility] Patient Information 2014 [place], [place].   ________________________________________________________________________

## 2016-03-29 ENCOUNTER — Encounter (HOSPITAL_COMMUNITY)
Admission: RE | Admit: 2016-03-29 | Discharge: 2016-03-29 | Disposition: A | Discharge status: Home / Self Care | Source: Ambulatory Visit | Attending: Orthopedic Surgery | Admitting: Orthopedic Surgery

## 2016-03-29 ENCOUNTER — Encounter (HOSPITAL_COMMUNITY): Payer: Self-pay

## 2016-03-29 ENCOUNTER — Encounter (INDEPENDENT_AMBULATORY_CARE_PROVIDER_SITE_OTHER): Payer: Self-pay

## 2016-03-29 ENCOUNTER — Ambulatory Visit (HOSPITAL_COMMUNITY)
Admission: RE | Admit: 2016-03-29 | Discharge: 2016-03-29 | Disposition: A | Discharge status: Home / Self Care | Source: Ambulatory Visit | Attending: Anesthesiology | Admitting: Anesthesiology

## 2016-03-29 DIAGNOSIS — Z0183 Encounter for blood typing: Secondary | ICD-10-CM | POA: Insufficient documentation

## 2016-03-29 DIAGNOSIS — Z01812 Encounter for preprocedural laboratory examination: Secondary | ICD-10-CM | POA: Insufficient documentation

## 2016-03-29 DIAGNOSIS — Z01818 Encounter for other preprocedural examination: Secondary | ICD-10-CM

## 2016-03-29 DIAGNOSIS — M170 Bilateral primary osteoarthritis of knee: Secondary | ICD-10-CM | POA: Insufficient documentation

## 2016-03-29 HISTORY — DX: Dyspnea, unspecified: R06.00

## 2016-03-29 HISTORY — DX: Gastro-esophageal reflux disease without esophagitis: K21.9

## 2016-03-29 HISTORY — DX: Unspecified osteoarthritis, unspecified site: M19.90

## 2016-03-29 HISTORY — DX: Anemia, unspecified: D64.9

## 2016-03-29 HISTORY — DX: Edema, unspecified: R60.9

## 2016-03-29 HISTORY — DX: Personal history of urinary calculi: Z87.442

## 2016-03-29 LAB — SURGICAL PCR SCREEN
MRSA, PCR: NEGATIVE
Staphylococcus aureus: NEGATIVE

## 2016-03-29 LAB — ABO/RH: ABO/RH(D): O POS

## 2016-03-29 LAB — GLUCOSE, CAPILLARY: Glucose-Capillary: 139 mg/dL — ABNORMAL HIGH (ref 65–99)

## 2016-03-29 NOTE — Progress Notes (Signed)
HGBA1C done 03/13/16-7.1 on chart CBC/DIFF/CMP/PT/PTT- 03/13/16 on chart  EKG- 03/12/16- on chart

## 2016-04-02 NOTE — H&P (Signed)
TOTAL KNEE ADMISSION H&P  Patient is being admitted for bilateral total knee arthroplasty.  Subjective:  Chief Complaint:   Bilateral knee primary OA / pain  HPI: Stephanie Fox, 65 y.o. female, has a history of pain and functional disability in the bilateral knee due to arthritis and has failed non-surgical conservative treatments for greater than 12 weeks to include NSAID's and/or analgesics, corticosteriod injections, use of assistive devices and activity modification.  Onset of symptoms was gradual, starting >10 years ago with gradually worsening course since that time. The patient noted prior procedures on the knee to include  menisectomy on the right knee(s).  Patient currently rates pain in the bilateral knee(s) at 8 out of 10 with activity. Patient has night pain, worsening of pain with activity and weight bearing, pain that interferes with activities of daily living, pain with passive range of motion, crepitus and joint swelling.  Patient has evidence of periarticular osteophytes and joint space narrowing by imaging studies. There is no active infection.   Risks, benefits and expectations were discussed with the patient.  Risks including but not limited to the risk of anesthesia, blood clots, nerve damage, blood vessel damage, failure of the prosthesis, infection and up to and including death.  Patient understand the risks, benefits and expectations and wishes to proceed with surgery.   PCP: Stephanie Coma, MD  D/C Plans:      Home with HHPT/SNF  Post-op Meds:       No Rx given   Tranexamic Acid:      To be given - IV   Decadron:      Is to be given  FYI:     Xarelto then ASA  Oxycodone    Patient Active Problem List   Diagnosis Date Noted  . RUQ abdominal pain 01/09/2013   Past Medical History:  Diagnosis Date  . Anemia    as a child   . Arthritis   . Diabetes mellitus ([place])    type II   . Dyspnea    with exertion   . Edema    lower extremities   . GERD  (gastroesophageal reflux disease)   . History of kidney stones   . Hyperlipidemia   . Hypertension     Past Surgical History:  Procedure Laterality Date  . BREAST REDUCTION SURGERY    . KNEE SURGERY    . thumb surgery    . underarm surgery      No prescriptions prior to admission.   No Known Allergies   Social History  Substance Use Topics  . Smoking status: Current Every Day Smoker    Packs/day: 0.50    Types: Cigarettes  . Smokeless tobacco: Never Used  . Alcohol use No    Family History  Problem Relation Age of Onset  . Colon cancer Neg Hx      Review of Systems  Constitutional: Negative.   HENT: Negative.   Eyes: Negative.   Respiratory: Positive for shortness of breath (on exertion).   Cardiovascular: Negative.   Gastrointestinal: Positive for constipation and heartburn.  Genitourinary: Negative.   Musculoskeletal: Positive for joint pain.  Skin: Negative.   Neurological: Negative.   Endo/Heme/Allergies: Negative.   Psychiatric/Behavioral: Negative.     Objective:  Physical Exam  Constitutional: She is oriented to person, place, and time. She appears well-developed.  HENT:  Head: Normocephalic.  Eyes: Pupils are equal, round, and reactive to light.  Neck: Neck supple. No JVD present. No tracheal deviation present. No  thyromegaly present.  Cardiovascular: Normal rate, regular rhythm, normal heart sounds and intact distal pulses.   Respiratory: Effort normal and breath sounds normal. No respiratory distress. She has no wheezes.  GI: Soft. There is no tenderness. There is no guarding.  Musculoskeletal:       Right knee: She exhibits decreased range of motion, swelling and bony tenderness. She exhibits no ecchymosis, no deformity, no laceration and no erythema. Tenderness found.       Left knee: She exhibits decreased range of motion, swelling and bony tenderness. She exhibits no ecchymosis, no deformity, no laceration and no erythema. Tenderness found.   Lymphadenopathy:    She has no cervical adenopathy.  Neurological: She is alert and oriented to person, place, and time.  Skin: Skin is warm and dry.  Psychiatric: She has a normal mood and affect.     Labs:  Estimated body mass index is 39.77 kg/m as calculated from the following:   Height as of 03/29/16: 5\' 5"  (1.651 m).   Weight as of 03/29/16: 108.4 kg (239 lb).   Imaging Review Plain radiographs demonstrate severe degenerative joint disease of the bilateral knee(s). T The bone quality appears to be good for age and reported activity level.  Assessment/Plan:  End stage arthritis, bilateral knees   The patient history, physical examination, clinical judgment of the provider and imaging studies are consistent with end stage degenerative joint disease of the bilateral knees and total knee arthroplasty is deemed medically necessary. The treatment options including medical management, injection therapy arthroscopy and arthroplasty were discussed at length. The risks and benefits of total knee arthroplasty were presented and reviewed. The risks due to aseptic loosening, infection, stiffness, patella tracking problems, thromboembolic complications and other imponderables were discussed. The patient acknowledged the explanation, agreed to proceed with the plan and consent was signed. Patient is being admitted for inpatient treatment for surgery, pain control, PT, OT, prophylactic antibiotics, VTE prophylaxis, progressive ambulation and ADL's and discharge planning. The patient is planning to be discharged home vs SNF.       Stephanie Pugh Braylie Badami   PA-C  04/02/2016, 2:54 PM

## 2016-04-03 NOTE — Progress Notes (Signed)
Clearance- 03/12/16- Dr Stephanie Acre on chart  LOV- 03/12/16- DR Stephanie Acre on chart

## 2016-04-06 ENCOUNTER — Inpatient Hospital Stay (HOSPITAL_COMMUNITY)
Admission: RE | Admit: 2016-04-06 | Discharge: 2016-04-12 | DRG: 462 | Disposition: A | Discharge status: Home Health | Source: Ambulatory Visit | Attending: Orthopedic Surgery | Admitting: Orthopedic Surgery

## 2016-04-06 ENCOUNTER — Encounter (HOSPITAL_COMMUNITY): Payer: Self-pay | Admitting: *Deleted

## 2016-04-06 ENCOUNTER — Inpatient Hospital Stay (HOSPITAL_COMMUNITY): Admitting: Anesthesiology

## 2016-04-06 ENCOUNTER — Encounter (HOSPITAL_COMMUNITY)
Admission: RE | Disposition: A | Discharge status: Home Health | Payer: Self-pay | Source: Ambulatory Visit | Attending: Orthopedic Surgery

## 2016-04-06 DIAGNOSIS — Z96659 Presence of unspecified artificial knee joint: Secondary | ICD-10-CM

## 2016-04-06 DIAGNOSIS — E119 Type 2 diabetes mellitus without complications: Secondary | ICD-10-CM | POA: Diagnosis present

## 2016-04-06 DIAGNOSIS — E785 Hyperlipidemia, unspecified: Secondary | ICD-10-CM | POA: Diagnosis present

## 2016-04-06 DIAGNOSIS — I10 Essential (primary) hypertension: Secondary | ICD-10-CM | POA: Diagnosis present

## 2016-04-06 DIAGNOSIS — M659 Synovitis and tenosynovitis, unspecified: Secondary | ICD-10-CM | POA: Diagnosis present

## 2016-04-06 DIAGNOSIS — Z6839 Body mass index (BMI) 39.0-39.9, adult: Secondary | ICD-10-CM

## 2016-04-06 DIAGNOSIS — E669 Obesity, unspecified: Secondary | ICD-10-CM | POA: Diagnosis present

## 2016-04-06 DIAGNOSIS — Z96653 Presence of artificial knee joint, bilateral: Secondary | ICD-10-CM

## 2016-04-06 DIAGNOSIS — F17210 Nicotine dependence, cigarettes, uncomplicated: Secondary | ICD-10-CM | POA: Diagnosis present

## 2016-04-06 DIAGNOSIS — M170 Bilateral primary osteoarthritis of knee: Secondary | ICD-10-CM | POA: Diagnosis present

## 2016-04-06 DIAGNOSIS — K219 Gastro-esophageal reflux disease without esophagitis: Secondary | ICD-10-CM | POA: Diagnosis present

## 2016-04-06 DIAGNOSIS — M25561 Pain in right knee: Secondary | ICD-10-CM | POA: Diagnosis present

## 2016-04-06 HISTORY — PX: TOTAL KNEE ARTHROPLASTY: SHX125

## 2016-04-06 LAB — GLUCOSE, CAPILLARY
GLUCOSE-CAPILLARY: 126 mg/dL — AB (ref 65–99)
GLUCOSE-CAPILLARY: 124 mg/dL — AB (ref 65–99)
Glucose-Capillary: 124 mg/dL — ABNORMAL HIGH (ref 65–99)

## 2016-04-06 LAB — TYPE AND SCREEN
ABO/RH(D): O POS
Antibody Screen: NEGATIVE

## 2016-04-06 SURGERY — ARTHROPLASTY, KNEE, BILATERAL, TOTAL
Anesthesia: Spinal | Site: Knee | Laterality: Bilateral

## 2016-04-06 MED ORDER — SODIUM CHLORIDE 0.9 % IR SOLN
Status: DC | PRN
  Administered 2016-04-06: 2000 mL

## 2016-04-06 MED ORDER — FENTANYL CITRATE (PF) 100 MCG/2ML IJ SOLN: 25.0000 ug | INTRAMUSCULAR | Status: DC | PRN

## 2016-04-06 MED ORDER — BUPIVACAINE IN DEXTROSE 0.75-8.25 % IT SOLN: INTRATHECAL | Status: DC | PRN

## 2016-04-06 MED ORDER — ONDANSETRON HCL 4 MG/2ML IJ SOLN
INTRAMUSCULAR | Status: AC
  Filled 2016-04-06: qty 2

## 2016-04-06 MED ORDER — PROPOFOL 500 MG/50ML IV EMUL
INTRAVENOUS | Status: DC | PRN
  Administered 2016-04-06: 75 ug/kg/min via INTRAVENOUS

## 2016-04-06 MED ORDER — NICOTINE 14 MG/24HR TD PT24
14.0000 mg | MEDICATED_PATCH | Freq: Every day | TRANSDERMAL | Status: DC
  Administered 2016-04-06 – 2016-04-12 (×7): 14 mg via TRANSDERMAL
  Filled 2016-04-06 (×7): qty 1

## 2016-04-06 MED ORDER — DOCUSATE SODIUM 100 MG PO CAPS
100.0000 mg | ORAL_CAPSULE | Freq: Two times a day (BID) | ORAL | Status: DC
Start: 2016-04-06 — End: 2016-04-12
  Administered 2016-04-06 – 2016-04-12 (×12): 100 mg via ORAL
  Filled 2016-04-06 (×12): qty 1

## 2016-04-06 MED ORDER — DEXAMETHASONE SODIUM PHOSPHATE 10 MG/ML IJ SOLN
INTRAMUSCULAR | Status: AC
  Filled 2016-04-06: qty 1

## 2016-04-06 MED ORDER — BUPIVACAINE HCL (PF) 0.5 % IJ SOLN
INTRAMUSCULAR | Status: AC
  Filled 2016-04-06: qty 30

## 2016-04-06 MED ORDER — CEFAZOLIN SODIUM-DEXTROSE 2-4 GM/100ML-% IV SOLN: 2.0000 g | INTRAVENOUS | Status: DC

## 2016-04-06 MED ORDER — BUPIVACAINE HCL (PF) 0.25 % IJ SOLN
INTRAMUSCULAR | Status: AC
  Filled 2016-04-06: qty 30

## 2016-04-06 MED ORDER — PROPOFOL 10 MG/ML IV BOLUS
INTRAVENOUS | Status: AC
  Filled 2016-04-06: qty 20

## 2016-04-06 MED ORDER — DEXAMETHASONE SODIUM PHOSPHATE 10 MG/ML IJ SOLN
INTRAMUSCULAR | Status: DC | PRN
  Administered 2016-04-06: 10 mg via INTRAVENOUS

## 2016-04-06 MED ORDER — PHENOL 1.4 % MT LIQD
1.0000 | OROMUCOSAL | Status: DC | PRN
Start: 2016-04-06 — End: 2016-04-12

## 2016-04-06 MED ORDER — FENTANYL CITRATE (PF) 100 MCG/2ML IJ SOLN
INTRAMUSCULAR | Status: AC
  Filled 2016-04-06: qty 2

## 2016-04-06 MED ORDER — CHLORHEXIDINE GLUCONATE 4 % EX LIQD: 60.0000 mL | Freq: Once | CUTANEOUS | Status: DC

## 2016-04-06 MED ORDER — DEXAMETHASONE SODIUM PHOSPHATE 10 MG/ML IJ SOLN: 10.0000 mg | Freq: Once | INTRAMUSCULAR | Status: DC

## 2016-04-06 MED ORDER — LIRAGLUTIDE 18 MG/3ML ~~LOC~~ SOPN: 0.6000 mg | PEN_INJECTOR | Freq: Every day | SUBCUTANEOUS | Status: DC

## 2016-04-06 MED ORDER — CEFAZOLIN SODIUM-DEXTROSE 2-4 GM/100ML-% IV SOLN
2.0000 g | Freq: Four times a day (QID) | INTRAVENOUS | Status: AC
  Administered 2016-04-06 – 2016-04-07 (×2): 2 g via INTRAVENOUS
  Filled 2016-04-06 (×2): qty 100

## 2016-04-06 MED ORDER — BUPIVACAINE-EPINEPHRINE (PF) 0.5% -1:200000 IJ SOLN
INTRAMUSCULAR | Status: DC | PRN
  Administered 2016-04-06: 30 mL via PERINEURAL

## 2016-04-06 MED ORDER — CELECOXIB 200 MG PO CAPS
200.0000 mg | ORAL_CAPSULE | Freq: Two times a day (BID) | ORAL | Status: DC
  Administered 2016-04-06 – 2016-04-12 (×12): 200 mg via ORAL
  Filled 2016-04-06 (×12): qty 1

## 2016-04-06 MED ORDER — SODIUM CHLORIDE 0.9 % IJ SOLN
INTRAMUSCULAR | Status: DC | PRN
  Administered 2016-04-06 (×2): 30 mL

## 2016-04-06 MED ORDER — OXYCODONE HCL 5 MG PO TABS
5.0000 mg | ORAL_TABLET | ORAL | Status: DC
  Administered 2016-04-06: 5 mg via ORAL
  Administered 2016-04-07 (×2): 10 mg via ORAL
  Administered 2016-04-07 (×2): 15 mg via ORAL
  Administered 2016-04-07 – 2016-04-08 (×4): 10 mg via ORAL
  Administered 2016-04-08 – 2016-04-09 (×4): 15 mg via ORAL
  Administered 2016-04-09 (×3): 10 mg via ORAL
  Administered 2016-04-09: 01:00:00 15 mg via ORAL
  Administered 2016-04-10 (×6): 10 mg via ORAL
  Administered 2016-04-11 (×2): 15 mg via ORAL
  Administered 2016-04-11: 10 mg via ORAL
  Administered 2016-04-11: 09:00:00 5 mg via ORAL
  Administered 2016-04-11: 15 mg via ORAL
  Administered 2016-04-11 (×2): 10 mg via ORAL
  Administered 2016-04-12 (×3): 15 mg via ORAL
  Filled 2016-04-06: qty 2
  Filled 2016-04-06 (×2): qty 3
  Filled 2016-04-06 (×4): qty 2
  Filled 2016-04-06 (×7): qty 3
  Filled 2016-04-06: qty 2
  Filled 2016-04-06: qty 1
  Filled 2016-04-06: qty 2
  Filled 2016-04-06 (×4): qty 3
  Filled 2016-04-06: qty 1
  Filled 2016-04-06 (×3): qty 3
  Filled 2016-04-06: qty 2
  Filled 2016-04-06: qty 1
  Filled 2016-04-06 (×2): qty 2
  Filled 2016-04-06 (×2): qty 3
  Filled 2016-04-06: qty 2
  Filled 2016-04-06: qty 3
  Filled 2016-04-06: qty 2

## 2016-04-06 MED ORDER — ONDANSETRON HCL 4 MG/2ML IJ SOLN
INTRAMUSCULAR | Status: DC | PRN
  Administered 2016-04-06: 4 mg via INTRAVENOUS

## 2016-04-06 MED ORDER — SODIUM CHLORIDE 0.9 % IJ SOLN
INTRAMUSCULAR | Status: AC
  Filled 2016-04-06: qty 50

## 2016-04-06 MED ORDER — SODIUM CHLORIDE 0.9 % IV SOLN
INTRAVENOUS | Status: DC
  Administered 2016-04-06: 22:00:00 via INTRAVENOUS
  Filled 2016-04-06 (×17): qty 1000

## 2016-04-06 MED ORDER — HYDROCHLOROTHIAZIDE 12.5 MG PO CAPS
12.5000 mg | ORAL_CAPSULE | Freq: Every day | ORAL | Status: DC
  Administered 2016-04-07 – 2016-04-12 (×4): 12.5 mg via ORAL
  Filled 2016-04-06 (×6): qty 1

## 2016-04-06 MED ORDER — TRANEXAMIC ACID 1000 MG/10ML IV SOLN
1000.0000 mg | INTRAVENOUS | Status: AC
  Administered 2016-04-06: 1000 mg via INTRAVENOUS
  Filled 2016-04-06: qty 10

## 2016-04-06 MED ORDER — ACETAMINOPHEN 500 MG PO TABS
1000.0000 mg | ORAL_TABLET | Freq: Three times a day (TID) | ORAL | Status: DC
  Administered 2016-04-06 – 2016-04-12 (×18): 1000 mg via ORAL
  Filled 2016-04-06 (×18): qty 2

## 2016-04-06 MED ORDER — FUROSEMIDE 40 MG PO TABS: 40.0000 mg | ORAL_TABLET | Freq: Every day | ORAL | Status: DC | PRN

## 2016-04-06 MED ORDER — LACTATED RINGERS IV SOLN
INTRAVENOUS | Status: AC
  Administered 2016-04-06 (×2): via INTRAVENOUS

## 2016-04-06 MED ORDER — METOCLOPRAMIDE HCL 5 MG PO TABS: 5.0000 mg | ORAL_TABLET | Freq: Three times a day (TID) | ORAL | Status: DC | PRN

## 2016-04-06 MED ORDER — METOCLOPRAMIDE HCL 5 MG/ML IJ SOLN: 5.0000 mg | Freq: Three times a day (TID) | INTRAMUSCULAR | Status: DC | PRN

## 2016-04-06 MED ORDER — MIDAZOLAM HCL 2 MG/2ML IJ SOLN
INTRAMUSCULAR | Status: AC
  Filled 2016-04-06: qty 2

## 2016-04-06 MED ORDER — KETOROLAC TROMETHAMINE 30 MG/ML IJ SOLN
INTRAMUSCULAR | Status: AC
  Filled 2016-04-06: qty 1

## 2016-04-06 MED ORDER — METHOCARBAMOL 500 MG PO TABS
500.0000 mg | ORAL_TABLET | Freq: Four times a day (QID) | ORAL | Status: DC | PRN
  Administered 2016-04-09 – 2016-04-12 (×7): 500 mg via ORAL
  Filled 2016-04-06 (×7): qty 1

## 2016-04-06 MED ORDER — DEXTROSE 5 % IV SOLN
500.0000 mg | Freq: Four times a day (QID) | INTRAVENOUS | Status: DC | PRN
  Filled 2016-04-06: qty 5

## 2016-04-06 MED ORDER — METFORMIN HCL 500 MG PO TABS
500.0000 mg | ORAL_TABLET | Freq: Two times a day (BID) | ORAL | Status: DC
  Administered 2016-04-07 – 2016-04-12 (×12): 500 mg via ORAL
  Filled 2016-04-06 (×11): qty 1

## 2016-04-06 MED ORDER — BUPIVACAINE HCL (PF) 0.25 % IJ SOLN
INTRAMUSCULAR | Status: DC | PRN
  Administered 2016-04-06 (×2): 15 mL

## 2016-04-06 MED ORDER — PROMETHAZINE HCL 25 MG/ML IJ SOLN: 6.2500 mg | INTRAMUSCULAR | Status: DC | PRN

## 2016-04-06 MED ORDER — VALSARTAN-HYDROCHLOROTHIAZIDE 80-12.5 MG PO TABS: 1.0000 | ORAL_TABLET | Freq: Every day | ORAL | Status: DC

## 2016-04-06 MED ORDER — CEFAZOLIN SODIUM-DEXTROSE 2-4 GM/100ML-% IV SOLN
2.0000 g | INTRAVENOUS | Status: AC
  Administered 2016-04-06: 2 g via INTRAVENOUS
  Filled 2016-04-06: qty 100

## 2016-04-06 MED ORDER — ONDANSETRON HCL 4 MG/2ML IJ SOLN
4.0000 mg | Freq: Four times a day (QID) | INTRAMUSCULAR | Status: DC | PRN
  Administered 2016-04-09: 10:00:00 4 mg via INTRAVENOUS
  Filled 2016-04-06: qty 2

## 2016-04-06 MED ORDER — SODIUM CHLORIDE 0.9 % IR SOLN
Status: DC | PRN
  Administered 2016-04-06: 1000 mL

## 2016-04-06 MED ORDER — ALUM & MAG HYDROXIDE-SIMETH 200-200-20 MG/5ML PO SUSP
30.0000 mL | ORAL | Status: DC | PRN
  Administered 2016-04-08 – 2016-04-12 (×3): 30 mL via ORAL
  Filled 2016-04-06 (×3): qty 30

## 2016-04-06 MED ORDER — RIVAROXABAN 10 MG PO TABS
10.0000 mg | ORAL_TABLET | ORAL | Status: DC
  Administered 2016-04-07 – 2016-04-12 (×6): 10 mg via ORAL
  Filled 2016-04-06 (×6): qty 1

## 2016-04-06 MED ORDER — FERROUS SULFATE 325 (65 FE) MG PO TABS
325.0000 mg | ORAL_TABLET | Freq: Three times a day (TID) | ORAL | Status: DC
  Administered 2016-04-07 – 2016-04-12 (×15): 325 mg via ORAL
  Filled 2016-04-06 (×17): qty 1

## 2016-04-06 MED ORDER — MIDAZOLAM HCL 5 MG/5ML IJ SOLN
INTRAMUSCULAR | Status: DC | PRN
  Administered 2016-04-06 (×2): 1 mg via INTRAVENOUS

## 2016-04-06 MED ORDER — KETOROLAC TROMETHAMINE 30 MG/ML IJ SOLN
INTRAMUSCULAR | Status: DC | PRN
  Administered 2016-04-06 (×2): 15 mg

## 2016-04-06 MED ORDER — IRBESARTAN 75 MG PO TABS
75.0000 mg | ORAL_TABLET | Freq: Every day | ORAL | Status: DC
  Administered 2016-04-07 – 2016-04-12 (×4): 75 mg via ORAL
  Filled 2016-04-06 (×5): qty 1

## 2016-04-06 MED ORDER — MAGNESIUM CITRATE PO SOLN: 1.0000 | Freq: Once | ORAL | Status: DC | PRN

## 2016-04-06 MED ORDER — ONDANSETRON HCL 4 MG PO TABS: 4.0000 mg | ORAL_TABLET | Freq: Four times a day (QID) | ORAL | Status: DC | PRN

## 2016-04-06 MED ORDER — DEXAMETHASONE SODIUM PHOSPHATE 10 MG/ML IJ SOLN
10.0000 mg | Freq: Once | INTRAMUSCULAR | Status: AC
  Administered 2016-04-07: 15:00:00 10 mg via INTRAVENOUS
  Filled 2016-04-06: qty 1

## 2016-04-06 MED ORDER — PROPOFOL 10 MG/ML IV BOLUS
INTRAVENOUS | Status: AC
  Filled 2016-04-06: qty 40

## 2016-04-06 MED ORDER — POLYETHYLENE GLYCOL 3350 17 G PO PACK
17.0000 g | PACK | Freq: Two times a day (BID) | ORAL | Status: DC
  Administered 2016-04-07 – 2016-04-12 (×11): 17 g via ORAL
  Filled 2016-04-06 (×12): qty 1

## 2016-04-06 MED ORDER — MENTHOL 3 MG MT LOZG: 1.0000 | LOZENGE | OROMUCOSAL | Status: DC | PRN

## 2016-04-06 MED ORDER — MIDAZOLAM HCL 2 MG/2ML IJ SOLN
1.0000 mg | INTRAMUSCULAR | Status: DC | PRN
  Administered 2016-04-06: 2 mg via INTRAVENOUS
  Filled 2016-04-06: qty 2

## 2016-04-06 MED ORDER — HYDROMORPHONE HCL 2 MG/ML IJ SOLN
0.5000 mg | INTRAMUSCULAR | Status: DC | PRN
  Administered 2016-04-07 – 2016-04-08 (×2): 2 mg via INTRAVENOUS
  Filled 2016-04-06 (×2): qty 1

## 2016-04-06 MED ORDER — STERILE WATER FOR IRRIGATION IR SOLN
Status: DC | PRN
  Administered 2016-04-06: 2000 mL

## 2016-04-06 MED ORDER — FENTANYL CITRATE (PF) 100 MCG/2ML IJ SOLN
INTRAMUSCULAR | Status: DC | PRN
  Administered 2016-04-06 (×2): 50 ug via INTRAVENOUS

## 2016-04-06 MED ORDER — CEFAZOLIN SODIUM-DEXTROSE 2-4 GM/100ML-% IV SOLN
INTRAVENOUS | Status: AC
  Filled 2016-04-06: qty 100

## 2016-04-06 MED ORDER — FENTANYL CITRATE (PF) 100 MCG/2ML IJ SOLN
50.0000 ug | INTRAMUSCULAR | Status: DC | PRN
  Administered 2016-04-06: 50 ug via INTRAVENOUS

## 2016-04-06 MED ORDER — BUPIVACAINE HCL (PF) 0.5 % IJ SOLN
INTRAMUSCULAR | Status: DC | PRN
  Administered 2016-04-06: 3 mL

## 2016-04-06 MED ORDER — BENZONATATE 100 MG PO CAPS: 100.0000 mg | ORAL_CAPSULE | Freq: Two times a day (BID) | ORAL | Status: DC | PRN

## 2016-04-06 MED ORDER — BISACODYL 10 MG RE SUPP: 10.0000 mg | Freq: Every day | RECTAL | Status: DC | PRN

## 2016-04-06 MED ORDER — DIPHENHYDRAMINE HCL 25 MG PO CAPS: 25.0000 mg | ORAL_CAPSULE | Freq: Four times a day (QID) | ORAL | Status: DC | PRN

## 2016-04-06 MED ORDER — INSULIN ASPART 100 UNIT/ML ~~LOC~~ SOLN
0.0000 [IU] | Freq: Three times a day (TID) | SUBCUTANEOUS | Status: DC
  Administered 2016-04-07: 3 [IU] via SUBCUTANEOUS
  Administered 2016-04-07: 18:00:00 2 [IU] via SUBCUTANEOUS
  Administered 2016-04-07 – 2016-04-08 (×2): 3 [IU] via SUBCUTANEOUS
  Administered 2016-04-08 – 2016-04-09 (×2): 2 [IU] via SUBCUTANEOUS
  Administered 2016-04-09: 19:00:00 3 [IU] via SUBCUTANEOUS
  Administered 2016-04-09: 09:00:00 2 [IU] via SUBCUTANEOUS
  Administered 2016-04-10: 6 [IU] via SUBCUTANEOUS
  Administered 2016-04-10 (×2): 2 [IU] via SUBCUTANEOUS
  Administered 2016-04-11 (×2): 3 [IU] via SUBCUTANEOUS
  Administered 2016-04-11 – 2016-04-12 (×4): 2 [IU] via SUBCUTANEOUS

## 2016-04-06 SURGICAL SUPPLY — 57 items
BAG ZIPLOCK 12X15 (MISCELLANEOUS) IMPLANT
BANDAGE ACE 6X5 VEL STRL LF (GAUZE/BANDAGES/DRESSINGS) ×6 IMPLANT
BANDAGE ESMARK 6X9 LF (GAUZE/BANDAGES/DRESSINGS) ×2 IMPLANT
BLADE SAW SGTL 13.0X1.19X90.0M (BLADE) ×6 IMPLANT
BLADE SURG SZ10 CARB STEEL (BLADE) ×3 IMPLANT
BNDG COHESIVE 4X5 TAN STRL (GAUZE/BANDAGES/DRESSINGS) ×3 IMPLANT
BNDG ESMARK 6X9 LF (GAUZE/BANDAGES/DRESSINGS) ×6
BONE CEMENT GENTAMICIN (Cement) ×12 IMPLANT
BOWL SMART MIX CTS (DISPOSABLE) ×6 IMPLANT
CAPT KNEE TOTAL 3 ATTUNE ×6 IMPLANT
CEMENT BONE GENTAMICIN 40 (Cement) ×4 IMPLANT
CLOTH BEACON ORANGE TIMEOUT ST (SAFETY) ×3 IMPLANT
CUFF TOURN SGL QUICK 34 (TOURNIQUET CUFF) ×4
CUFF TRNQT CYL 34X4X40X1 (TOURNIQUET CUFF) ×2 IMPLANT
DECANTER SPIKE VIAL GLASS SM (MISCELLANEOUS) ×3 IMPLANT
DERMABOND ADVANCED (GAUZE/BANDAGES/DRESSINGS) ×4
DERMABOND ADVANCED .7 DNX12 (GAUZE/BANDAGES/DRESSINGS) ×2 IMPLANT
DRAPE EXTREMITY BILATERAL (DRAPES) ×3 IMPLANT
DRAPE INCISE IOBAN 66X45 STRL (DRAPES) ×3 IMPLANT
DRAPE U-SHAPE 47X51 STRL (DRAPES) ×9 IMPLANT
DRESSING AQUACEL AG SP 3.5X10 (GAUZE/BANDAGES/DRESSINGS) ×2 IMPLANT
DRSG AQUACEL AG ADV 3.5X10 (GAUZE/BANDAGES/DRESSINGS) ×6 IMPLANT
DRSG AQUACEL AG SP 3.5X10 (GAUZE/BANDAGES/DRESSINGS) ×6
DURAPREP 26ML APPLICATOR (WOUND CARE) ×6 IMPLANT
ELECT REM PT RETURN 9FT ADLT (ELECTROSURGICAL) ×3
ELECTRODE REM PT RTRN 9FT ADLT (ELECTROSURGICAL) ×1 IMPLANT
FACESHIELD WRAPAROUND (MASK) ×9 IMPLANT
GLOVE BIOGEL M 7.0 STRL (GLOVE) IMPLANT
GLOVE BIOGEL PI IND STRL 7.5 (GLOVE) ×1 IMPLANT
GLOVE BIOGEL PI IND STRL 8.5 (GLOVE) ×1 IMPLANT
GLOVE BIOGEL PI INDICATOR 7.5 (GLOVE) ×2
GLOVE BIOGEL PI INDICATOR 8.5 (GLOVE) ×2
GLOVE ECLIPSE 8.0 STRL XLNG CF (GLOVE) ×6 IMPLANT
GLOVE ORTHO TXT STRL SZ7.5 (GLOVE) ×6 IMPLANT
GOWN STRL REUS W/TWL LRG LVL3 (GOWN DISPOSABLE) ×3 IMPLANT
GOWN STRL REUS W/TWL XL LVL3 (GOWN DISPOSABLE) ×3 IMPLANT
HANDPIECE INTERPULSE COAX TIP (DISPOSABLE) ×2
MANIFOLD NEPTUNE II (INSTRUMENTS) ×3 IMPLANT
NDL SAFETY ECLIPSE 18X1.5 (NEEDLE) ×2 IMPLANT
NEEDLE HYPO 18GX1.5 SHARP (NEEDLE) ×4
NS IRRIG 1000ML POUR BTL (IV SOLUTION) ×3 IMPLANT
PACK TOTAL KNEE CUSTOM (KITS) ×3 IMPLANT
SET HNDPC FAN SPRY TIP SCT (DISPOSABLE) ×1 IMPLANT
SET PAD KNEE POSITIONER (MISCELLANEOUS) ×6 IMPLANT
SPONGE LAP 18X18 X RAY DECT (DISPOSABLE) IMPLANT
STOCKINETTE 8 INCH (MISCELLANEOUS) ×3 IMPLANT
SUT MNCRL AB 4-0 PS2 18 (SUTURE) ×6 IMPLANT
SUT VIC AB 1 CT1 36 (SUTURE) ×6 IMPLANT
SUT VIC AB 2-0 CT1 27 (SUTURE) ×8
SUT VIC AB 2-0 CT1 TAPERPNT 27 (SUTURE) ×4 IMPLANT
SUT VLOC 180 0 24IN GS25 (SUTURE) ×6 IMPLANT
SYRINGE 60CC LL (MISCELLANEOUS) ×3 IMPLANT
TOWEL OR 17X26 10 PK STRL BLUE (TOWEL DISPOSABLE) ×3 IMPLANT
TRAY FOLEY W/METER SILVER 16FR (SET/KITS/TRAYS/PACK) ×3 IMPLANT
WATER STERILE IRR 1500ML POUR (IV SOLUTION) ×3 IMPLANT
WRAP KNEE MAXI GEL POST OP (GAUZE/BANDAGES/DRESSINGS) ×6 IMPLANT
YANKAUER SUCT BULB TIP 10FT TU (MISCELLANEOUS) IMPLANT

## 2016-04-06 NOTE — Anesthesia Preprocedure Evaluation (Addendum)
Anesthesia Evaluation  Patient identified by MRN, date of birth, ID band Patient awake    Reviewed: Allergy & Precautions, NPO status , Patient's Chart, lab work & pertinent test results  Airway Mallampati: II  TM Distance: >3 FB Neck ROM: Full    Dental no notable dental hx. (+) Teeth Intact   Pulmonary Current Smoker,    Pulmonary exam normal breath sounds clear to auscultation       Cardiovascular hypertension, Pt. on medications (-) angina(-) CAD, (-) Past MI and (-) CHF Normal cardiovascular exam Rhythm:Regular Rate:Normal     Neuro/Psych negative neurological ROS  negative psych ROS   GI/Hepatic Neg liver ROS, GERD  ,  Endo/Other  diabetes, Type 2, Oral Hypoglycemic AgentsObesity   Renal/GU negative Renal ROS     Musculoskeletal  (+) Arthritis , Osteoarthritis,    Abdominal (+) + obese,   Peds  Hematology  (+) Blood dyscrasia, anemia ,   Anesthesia Other Findings Day of surgery medications reviewed with the patient.  Reproductive/Obstetrics                             Anesthesia Physical Anesthesia Plan  ASA: II  Anesthesia Plan: Combined Spinal and Epidural   Post-op Pain Management:    Induction: Intravenous  Airway Management Planned: Natural Airway, Simple Face Mask and Nasal Cannula  Additional Equipment:   Intra-op Plan:   Post-operative Plan:   Informed Consent: I have reviewed the patients History and Physical, chart, labs and discussed the procedure including the risks, benefits and alternatives for the proposed anesthesia with the patient or authorized representative who has indicated his/her understanding and acceptance.   Dental advisory given  Plan Discussed with: Anesthesiologist, CRNA and Surgeon  Anesthesia Plan Comments:         Anesthesia Quick Evaluation

## 2016-04-06 NOTE — Progress Notes (Signed)
Patient arrived on the unit from PACU at approximately 1925. Patient is alert and verbally responsive. CNS to bilateral legs intact. Ice packs in place.

## 2016-04-06 NOTE — Discharge Instructions (Addendum)
INSTRUCTIONS AFTER JOINT REPLACEMENT  ° °o Remove items at home which could result in a fall. This includes throw rugs or furniture in walking pathways °o ICE to the affected joint every three hours while awake for 30 minutes at a time, for at least the first 3-5 days, and then as needed for pain and swelling.  Continue to use ice for pain and swelling. You may notice swelling that will progress down to the foot and ankle.  This is normal after surgery.  Elevate your leg when you are not up walking on it.   °o Continue to use the breathing machine you got in the hospital (incentive spirometer) which will help keep your temperature down.  It is common for your temperature to cycle up and down following surgery, especially at night when you are not up moving around and exerting yourself.  The breathing machine keeps your lungs expanded and your temperature down. ° ° °DIET:  As you were doing prior to hospitalization, we recommend a well-balanced diet. ° °DRESSING / WOUND CARE / SHOWERING ° °Keep the surgical dressing until follow up.  The dressing is water proof, so you can shower without any extra covering.  IF THE DRESSING FALLS OFF or the wound gets wet inside, change the dressing with sterile gauze.  Please use good hand washing techniques before changing the dressing.  Do not use any lotions or creams on the incision until instructed by your surgeon.   ° °ACTIVITY ° °o Increase activity slowly as tolerated, but follow the weight bearing instructions below.   °o No driving for 6 weeks or until further direction given by your physician.  You cannot drive while taking narcotics.  °o No lifting or carrying greater than 10 lbs. until further directed by your surgeon. °o Avoid periods of inactivity such as sitting longer than an hour when not asleep. This helps prevent blood clots.  °o You may return to work once you are authorized by your doctor.  ° ° ° °WEIGHT BEARING  ° °Weight bearing as tolerated with assist  device (walker, cane, etc) as directed, use it as long as suggested by your surgeon or therapist, typically at least 4-6 weeks. ° ° °EXERCISES ° °Results after joint replacement surgery are often greatly improved when you follow the exercise, range of motion and muscle strengthening exercises prescribed by your doctor. Safety measures are also important to protect the joint from further injury. Any time any of these exercises cause you to have increased pain or swelling, decrease what you are doing until you are comfortable again and then slowly increase them. If you have problems or questions, call your caregiver or physical therapist for advice.  ° °Rehabilitation is important following a joint replacement. After just a few days of immobilization, the muscles of the leg can become weakened and shrink (atrophy).  These exercises are designed to build up the tone and strength of the thigh and leg muscles and to improve motion. Often times heat used for twenty to thirty minutes before working out will loosen up your tissues and help with improving the range of motion but do not use heat for the first two weeks following surgery (sometimes heat can increase post-operative swelling).  ° °These exercises can be done on a training (exercise) mat, on the floor, on a table or on a bed. Use whatever works the best and is most comfortable for you.    Use music or television while you are exercising so that   the exercises are a pleasant break in your day. This will make your life better with the exercises acting as a break in your routine that you can look forward to.   Perform all exercises about fifteen times, three times per day or as directed.  You should exercise both the operative leg and the other leg as well. ° °Exercises include: °  °• Quad Sets - Tighten up the muscle on the front of the thigh (Quad) and hold for 5-10 seconds.   °• Straight Leg Raises - With your knee straight (if you were given a brace, keep it on),  lift the leg to 60 degrees, hold for 3 seconds, and slowly lower the leg.  Perform this exercise against resistance later as your leg gets stronger.  °• Leg Slides: Lying on your back, slowly slide your foot toward your buttocks, bending your knee up off the floor (only go as far as is comfortable). Then slowly slide your foot back down until your leg is flat on the floor again.  °• Angel Wings: Lying on your back spread your legs to the side as far apart as you can without causing discomfort.  °• Hamstring Strength:  Lying on your back, push your heel against the floor with your leg straight by tightening up the muscles of your buttocks.  Repeat, but this time bend your knee to a comfortable angle, and push your heel against the floor.  You may put a pillow under the heel to make it more comfortable if necessary.  ° °A rehabilitation program following joint replacement surgery can speed recovery and prevent re-injury in the future due to weakened muscles. Contact your doctor or a physical therapist for more information on knee rehabilitation.  ° ° °CONSTIPATION ° °Constipation is defined medically as fewer than three stools per week and severe constipation as less than one stool per week.  Even if you have a regular bowel pattern at home, your normal regimen is likely to be disrupted due to multiple reasons following surgery.  Combination of anesthesia, postoperative narcotics, change in appetite and fluid intake all can affect your bowels.  ° °YOU MUST use at least one of the following options; they are listed in order of increasing strength to get the job done.  They are all available over the counter, and you may need to use some, POSSIBLY even all of these options:   ° °Drink plenty of fluids (prune juice may be helpful) and high fiber foods °Colace 100 mg by mouth twice a day  °Senokot for constipation as directed and as needed Dulcolax (bisacodyl), take with full glass of water  °Miralax (polyethylene glycol)  once or twice a day as needed. ° °If you have tried all these things and are unable to have a bowel movement in the first 3-4 days after surgery call either your surgeon or your primary doctor.   ° °If you experience loose stools or diarrhea, hold the medications until you stool forms back up.  If your symptoms do not get better within 1 week or if they get worse, check with your doctor.  If you experience "the worst abdominal pain ever" or develop nausea or vomiting, please contact the office immediately for further recommendations for treatment. ° ° °ITCHING:  If you experience itching with your medications, try taking only a single pain pill, or even half a pain pill at a time.  You can also use Benadryl over the counter for itching or also to   help with sleep.  ° °TED HOSE STOCKINGS:  Use stockings on both legs until for at least 2 weeks or as directed by physician office. They may be removed at night for sleeping. ° °MEDICATIONS:  See your medication summary on the “After Visit Summary” that nursing will review with you.  You may have some home medications which will be placed on hold until you complete the course of blood thinner medication.  It is important for you to complete the blood thinner medication as prescribed. ° °PRECAUTIONS:  If you experience chest pain or shortness of breath - call 911 immediately for transfer to the hospital emergency department.  ° °If you develop a fever greater that 101 F, purulent drainage from wound, increased redness or drainage from wound, foul odor from the wound/dressing, or calf pain - CONTACT YOUR SURGEON.   °                                                °FOLLOW-UP APPOINTMENTS:  If you do not already have a post-op appointment, please call the office for an appointment to be seen by your surgeon.  Guidelines for how soon to be seen are listed in your “After Visit Summary”, but are typically between 1-4 weeks after surgery. ° °OTHER INSTRUCTIONS:  ° °Knee  Replacement:  Do not place pillow under knee, focus on keeping the knee straight while resting.  ° °MAKE SURE YOU:  °• Understand these instructions.  °• Get help right away if you are not doing well or get worse.  ° ° °Thank you for letting us be a part of your medical care team.  It is a privilege we respect greatly.  We hope these instructions will help you stay on track for a fast and full recovery!  °  °Information on my medicine - XARELTO® (Rivaroxaban) ° °This medication education was reviewed with me or my healthcare representative as part of my discharge preparation.  The pharmacist that spoke with me during my hospital stay was:  Madolin Twaddle Marshall, RPH ° °Why was Xarelto® prescribed for you? °Xarelto® was prescribed for you to reduce the risk of blood clots forming after orthopedic surgery. The medical term for these abnormal blood clots is venous thromboembolism (VTE). ° °What do you need to know about xarelto® ? °Take your Xarelto® ONCE DAILY at the same time every day. °You may take it either with or without food. ° °If you have difficulty swallowing the tablet whole, you may crush it and mix in applesauce just prior to taking your dose. ° °Take Xarelto® exactly as prescribed by your doctor and DO NOT stop taking Xarelto® without talking to the doctor who prescribed the medication.  Stopping without other VTE prevention medication to take the place of Xarelto® may increase your risk of developing a clot. ° °After discharge, you should have regular check-up appointments with your healthcare provider that is prescribing your Xarelto®.   ° °What do you do if you miss a dose? °If you miss a dose, take it as soon as you remember on the same day then continue your regularly scheduled once daily regimen the next day. Do not take two doses of Xarelto® on the same day.  ° °Important Safety Information °A possible side effect of Xarelto® is bleeding. You should call your healthcare provider right away if you    experience any of the following: ? Bleeding from an injury or your nose that does not stop. ? Unusual colored urine (red or dark brown) or unusual colored stools (red or black). ? Unusual bruising for unknown reasons. ? A serious fall or if you hit your head (even if there is no bleeding).  Some medicines may interact with Xarelto and might increase your risk of bleeding while on Xarelto. To help avoid this, consult your healthcare provider or pharmacist prior to using any new prescription or non-prescription medications, including herbals, vitamins, non-steroidal anti-inflammatory drugs (NSAIDs) and supplements.  This website has more information on Xarelto: [email or web address]

## 2016-04-06 NOTE — Transfer of Care (Signed)
Immediate Anesthesia Transfer of Care Note  Patient: Stephanie Fox  Procedure(s) Performed: Procedure(s) with comments: BILATERAL TOTAL KNEE ARTHROPLASTY (Bilateral) - Adductor Block  Patient Location: PACU  Anesthesia Type:Spinal  Level of Consciousness:  sedated, patient cooperative and responds to stimulation  Airway & Oxygen Therapy:Patient Spontanous Breathing and Patient connected to face mask oxgen  Post-op Assessment:  Report given to PACU RN and Post -op Vital signs reviewed and stable  Post vital signs:  Reviewed and stable  Last Vitals:  Vitals:   04/06/16 1526 04/06/16 1527  BP: 126/82   Pulse: 90 88  Resp: 17   Temp:      Complications: No apparent anesthesia complications

## 2016-04-06 NOTE — Progress Notes (Signed)
AssistedDr. Gifford Shave with right, left, ultrasound guided, adductor canal blocks. Side rails up, monitors on throughout procedure. See vital signs in flow sheet. Tolerated Procedure well.

## 2016-04-06 NOTE — Anesthesia Procedure Notes (Signed)
Spinal  Start time: 04/06/2016 3:35 PM End time: 04/06/2016 3:39 PM Staffing Anesthesiologist: Catalina Gravel Resident/CRNA: Lajuana Carry E Performed: resident/CRNA  Preanesthetic Checklist Completed: patient identified, site marked, surgical consent, pre-op evaluation, timeout performed, IV checked, risks and benefits discussed and monitors and equipment checked Spinal Block Patient position: sitting Prep: Betadine Patient monitoring: heart rate, continuous pulse ox and blood pressure Approach: midline Location: L3-4 Injection technique: single-shot Needle Needle type: Sprotte  Needle gauge: 24 G Needle length: 10 cm Assessment Sensory level: T6 Additional Notes Time out performed, sterile prep and drape, first attempt clear CSF, neg heme, neg paresthesia. Tol well, return to supine.

## 2016-04-06 NOTE — Anesthesia Postprocedure Evaluation (Signed)
Anesthesia Post Note  Patient: Stephanie Fox  Procedure(s) Performed: Procedure(s) (LRB): BILATERAL TOTAL KNEE ARTHROPLASTY (Bilateral)  Patient location during evaluation: PACU Anesthesia Type: Spinal, Regional and MAC Level of consciousness: oriented and awake and alert Pain management: pain level controlled Vital Signs Assessment: post-procedure vital signs reviewed and stable Respiratory status: spontaneous breathing, respiratory function stable and patient connected to nasal cannula oxygen Cardiovascular status: blood pressure returned to baseline and stable Postop Assessment: no headache, no backache, spinal receding, no signs of nausea or vomiting and patient able to bend at knees Anesthetic complications: no    Last Vitals:  Vitals:   04/06/16 2030 04/06/16 2143  BP: (!) 136/96 (!) 156/99  Pulse: 97 80  Resp: 18 14  Temp: 36.8 C 37 C    Last Pain:  Vitals:   04/06/16 2143  TempSrc: Oral  PainSc:                  Catalina Gravel

## 2016-04-06 NOTE — Anesthesia Procedure Notes (Signed)
Anesthesia Regional Block:  Adductor canal block  Pre-Anesthetic Checklist: ,, timeout performed, Correct Patient, Correct Site, Correct Laterality, Correct Procedure, Correct Position, site marked, Risks and benefits discussed,  Surgical consent,  Pre-op evaluation,  At surgeon's request and post-op pain management  Laterality: Right and Left  Prep: chloraprep       Needles:  Injection technique: Single-shot  Needle Type: Echogenic Needle     Needle Length: 9cm 9 cm Needle Gauge: 21 and 21 G    Additional Needles:  Procedures: ultrasound guided (picture in chart) Adductor canal block Narrative:  Start time: 04/06/2016 2:56 PM End time: 04/06/2016 3:06 PM Injection made incrementally with aspirations every 5 mL.  Performed by: Personally  Anesthesiologist: Catalina Gravel  Additional Notes: No pain on injection. No increased resistance to injection. Injection made in 5cc increments.  Good needle visualization.  Patient tolerated procedure well.  BILATERAL ADDUCTOR CANAL NERVE BLOCKS for bilateral total knee arthroplasties.

## 2016-04-06 NOTE — Op Note (Signed)
NAME:  Stephanie Fox                      MEDICAL RECORD NO.:  [id number]                             FACILITY:  [facility]      PHYSICIAN:  Pietro Cassis. Alvan Dame, M.D.  DATE OF BIRTH:  1950-05-08      DATE OF PROCEDURE:  04/06/2016                                     OPERATIVE REPORT  This was a simultaneously performed bilateral knee replacement procedure.  Each knee gets separate templated operative note     First procedure was on left then right     PREOPERATIVE DIAGNOSIS:  Left knee osteoarthritis.      POSTOPERATIVE DIAGNOSIS:  Left knee osteoarthritis.      FINDINGS:  The patient was noted to have complete loss of cartilage and   bone-on-bone arthritis with associated osteophytes in the medial and patellofemoral compartments of   the knee with a significant synovitis and associated effusion.      PROCEDURE:  Left total knee replacement.      COMPONENTS USED:  DePuy Attune rotating platform posterior stabilized knee   system, a size 4N femur, 4 tibia, size 8 PS AOX insert, and 35 anatomic patellar   button.      SURGEON:  Pietro Cassis. Alvan Dame, M.D.      ASSISTANT:  Danae Orleans, PA-C.      ANESTHESIA:  Regional and Spinal.      SPECIMENS:  None.      COMPLICATION:  None.      DRAINS: None.  EBL: <200cc      TOURNIQUET TIME:   Total Tourniquet Time Documented: Thigh (Left) - 29 minutes Total: Thigh (Left) - 29 minutes  Thigh (Right) - 47 minutes Total: Thigh (Right) - 47 minutes  .      The patient was stable to the recovery room.      INDICATION FOR PROCEDURE:  Stephanie Fox is a 65 y.o. female patient of   mine.  The patient had been seen, evaluated, and treated conservatively in the   office with medication, activity modification, and injections.  The patient had   radiographic changes of bone-on-bone arthritis with endplate sclerosis and osteophytes noted.      The patient failed conservative measures including medication, injections, and activity modification, and at  this point was ready for more definitive measures.   Based on the radiographic changes and failed conservative measures, the patient   decided to proceed with total knee replacement.  Risks of infection,   DVT, component failure, need for revision surgery, postop course, and   expectations were all   discussed and reviewed.  Consent was obtained for benefit of pain   relief.      PROCEDURE IN DETAIL:  The patient was brought to the operative theater.   Once adequate anesthesia, preoperative antibiotics, 2 gm of Ancef, 1 gm of Tranexamic Acid, and 10 mg of Decadron administered, the patient was positioned supine with the left thigh tourniquet had been placed.  The  left lower extremity was prepped and draped in sterile fashion with the right knee.  A time-   out was performed identifying the patient, planned  procedures, and each  extremity.      The left lower extremity was placed in the [facility] leg holder.  The leg was   exsanguinated, tourniquet elevated to 250 mmHg.  A midline incision was   made followed by median parapatellar arthrotomy.  Following initial   exposure, attention was first directed to the patella.  Precut   measurement was noted to be 24 mm.  I resected down to 14 mm and used a   35 anatomic patellar button to restore patellar height as well as cover the cut   surface.      The lug holes were drilled and a metal shim was placed to protect the   patella from retractors and saw blades.      At this point, attention was now directed to the femur.  The femoral   canal was opened with a drill, irrigated to try to prevent fat emboli.  An   intramedullary rod was passed at 3 degrees valgus, 9 mm of bone was   resected off the distal femur.  Following this resection, the tibia was   subluxated anteriorly.  Using the extramedullary guide, 2 mm of bone was resected off   the proximal medial tibia.  We confirmed the gap would be   stable medially and laterally with a size 7  spacer block as well as confirmed   the cut was perpendicular in the coronal plane, checking with an alignment rod.      Once this was done, I sized the femur to be a size 4 in the anterior-   posterior dimension to match extension gap, chose a narrow component based on medial and   lateral dimension.  The size 4 rotation block was then pinned in   position anterior referenced using the C-clamp to set rotation.  The   anterior, posterior, and  chamfer cuts were made without difficulty nor   notching making certain that I was along the anterior cortex to help   with flexion gap stability.      The final box cut was made off the lateral aspect of distal femur.      At this point, the tibia was sized to be a size 4, the size 4 tray was   then pinned in position through the medial third of the tubercle,   drilled, and keel punched.  Trial reduction was now carried with a 4 femur,  4 tibia, a size 7 then 8 PS insert, and the 35 anatomic patella botton.  The knee was brought to   extension, full extension with good flexion stability with the patella   tracking through the trochlea without application of pressure.  Given   all these findings the femoral lug holes were drilled and then the trial components removed.  Final components were   opened and cement was mixed.  The knee was irrigated with normal saline   solution and pulse lavage.  The synovial lining was   then injected with one half of a 30 cc 0.25% Marcaine without epinephrine and 1 cc of Toradol plus 30 cc of NS for a total of 61 cc.      The knee was irrigated.  Final implants were then cemented onto clean and   dried cut surfaces of bone with the knee brought to extension with a size 8 PS trial insert.      Once the cement had fully cured, the excess cement was removed   throughout the knee.  I confirmed I was satisfied with the range of   motion and stability, and the final size 8 PS AOX insert was chosen.  It was   placed into  the knee.      The tourniquet had been let down at 29 minutes.  No significant   hemostasis required.  The   extensor mechanism was then reapproximated using #1 Vicryl and #0 V-lock sutures with the knee   in flexion.  The   remaining wound was closed with 2-0 Vicryl and running 4-0 Monocryl.   The knee was cleaned, dried, dressed sterilely using Dermabond and   Aquacel dressing.  The patient was then   brought to recovery room in stable condition, tolerating the procedure   well.    04/06/2016 5:52 PM  NAME:  Stephanie Fox                      MEDICAL RECORD NO.:  [id number]                             FACILITY:  [facility]      PHYSICIAN:  Pietro Cassis. Alvan Dame, M.D.  DATE OF BIRTH:  1950/06/26      DATE OF PROCEDURE:  04/06/2016                                     OPERATIVE REPORT         PREOPERATIVE DIAGNOSIS:  Right knee osteoarthritis.      POSTOPERATIVE DIAGNOSIS:  Right knee osteoarthritis.      FINDINGS:  The patient was noted to have complete loss of cartilage and   bone-on-bone arthritis with associated osteophytes in all three compartments of   the knee with a significant synovitis and associated effusion.      PROCEDURE:  Right total knee replacement.      COMPONENTS USED:  DePuy Attune rotating platform posterior stabilized knee   system, a size 5N femur, 4 tibia, size 6 PS AOX insert, and 35 anatomic patellar   button.      SURGEON:  Pietro Cassis. Alvan Dame, M.D.      ASSISTANT:  Danae Orleans, PA-C.      ANESTHESIA:  Regional and Spinal.      SPECIMENS:  None.      COMPLICATION:  None.      DRAINS:  None.  EBL: <150cc      TOURNIQUET TIME:   Total Tourniquet Time Documented: Thigh (Left) - 29 minutes Total: Thigh (Left) - 29 minutes  Thigh (Right) - 47 minutes Total: Thigh (Right) - 47 minutes  .      The patient was stable to the recovery room.      INDICATION FOR PROCEDURE:  Stephanie Fox is a 65 y.o. female patient of   mine.  The patient had been seen,  evaluated, and treated conservatively in the   office with medication, activity modification, and injections.  The patient had   radiographic changes of bone-on-bone arthritis with endplate sclerosis and osteophytes noted.      The patient failed conservative measures including medication, injections, and activity modification, and at this point was ready for more definitive measures.   Based on the radiographic changes and failed conservative measures, the patient   decided to proceed with total knee replacement.  Risks of infection,   DVT, component  failure, need for revision surgery, postop course, and   expectations were all   discussed and reviewed.  Consent was obtained for benefit of pain   relief.      PROCEDURE IN DETAIL:  The patient was brought to the operative theater.   Once adequate anesthesia, preoperative antibiotics, 2 gm of Ancef, 1 gm of Tranexamic Acid, and 10 mg of Decadron administered, the patient was positioned supine with the right thigh tourniquet placed.  The  right lower extremity was prepped and draped in sterile fashion with the left knee prior to beginning both knees.  A time-   out was performed identifying the patient, planned procedure, and   extremity.      The right lower extremity was placed in the [facility] leg holder.  The leg was   exsanguinated, tourniquet elevated to 250 mmHg.  A midline incision was   made followed by median parapatellar arthrotomy.  Following initial   exposure, attention was first directed to the patella.  Precut   measurement was noted to be 24 mm.  I resected down to 14 mm and used a   35 anatomic patellar button to restore patellar height as well as cover the cut   surface.      The lug holes were drilled and a metal shim was placed to protect the   patella from retractors and saw blades.      At this point, attention was now directed to the femur.  The femoral   canal was opened with a drill, irrigated to try to prevent fat  emboli.  An   intramedullary rod was passed at 3 degrees valgus, 9 mm of bone was   resected off the distal femur.  Following this resection, the tibia was   subluxated anteriorly.  Using the extramedullary guide, 2 mm of bone was resected off   the proximal medial tibia.  We confirmed the gap would be   stable medially and laterally with a size 5 spacer block as well as confirmed   the cut was perpendicular in the coronal plane, checking with an alignment rod.      Once this was done, I sized the femur to be a size 5 in the anterior-   posterior dimension, chose a narrow component based on medial and   lateral dimension.  The size 5 rotation block was then pinned in   position anterior referenced using the C-clamp to set rotation.  The   anterior, posterior, and  chamfer cuts were made without difficulty nor   notching making certain that I was along the anterior cortex to help   with flexion gap stability.      The final box cut was made off the lateral aspect of distal femur.      At this point, the tibia was sized to be a size 4, the size 4 tray was   then pinned in position through the medial third of the tubercle,   drilled, and keel punched.  Trial reduction was now carried with a 5 femur,  4 tibia, a size 6 PS AOX insert, and the 35 anatomic patella botton.  The knee was brought to   extension, full extension with good flexion stability with the patella   tracking through the trochlea without application of pressure.  Given   all these findings the femoral lug holes were drilled and then the trial components removed.  Final components were   opened and cement was mixed.  The knee was irrigated with normal saline   solution and pulse lavage.  The synovial lining was   then injected with 30 cc of a mixture of 30 cc 0.25% Marcaine without epinephrine and 1 cc of Toradol plus 30 cc of NS for a total of 61 cc.      The knee was irrigated.  Final implants were then cemented onto clean  and   dried cut surfaces of bone with the knee brought to extension with a size 6 PS trial insert.      Once the cement had fully cured, the excess cement was removed   throughout the knee.  I confirmed I was satisfied with the range of   motion and stability, and the final size 6 PS AOX insert was chosen.  It was   placed into the knee.      The tourniquet had been let down at 47 minutes.  No significant   hemostasis required.  The   extensor mechanism was then reapproximated using #1 Vicryl and #0 V-lock sutures with the knee   in flexion.  The   remaining wound was closed with 2-0 Vicryl and running 4-0 Monocryl.   The knee was cleaned, dried, dressed sterilely using Dermabond and   Aquacel dressing.  The patient was then   brought to recovery room in stable condition, tolerating the procedure   well.   Please note that Physician Assistant, Danae Orleans, PA-C, was present for the entirety of both procedures, and was utilized for pre-operative positioning, peri-operative retractor management, general facilitation of the procedure.  He was also utilized for primary wound closure at the end of the case.              Pietro Cassis Alvan Dame, M.D.    04/06/2016 5:52 PM

## 2016-04-06 NOTE — Interval H&P Note (Signed)
History and Physical Interval Note:  04/06/2016 2:40 PM  Stephanie Fox  has presented today for surgery, with the diagnosis of Bilateral knee osteoarthritis  The various methods of treatment have been discussed with the patient and family. After consideration of risks, benefits and other options for treatment, the patient has consented to  Procedure(s): BILATERAL TOTAL KNEE ARTHROPLASTY (Bilateral) as a surgical intervention .  The patient's history has been reviewed, patient examined, no change in status, stable for surgery.  I have reviewed the patient's chart and labs.  Questions were answered to the patient's satisfaction.     Mauri Pole

## 2016-04-06 NOTE — Progress Notes (Addendum)
Patient reports being a smoker and wanting to smoke. Patient aware of no smoking in hospital and patient aware of being safe and not getting out of bed. Nurse spoke to patient about nicotine patch. Patient would like nurse to call doctor and get order for nicotine patch. Nurse called [facility] Char, left message for provider on call concerning nicotine patch.   [id number] Nurse received call from [facility] regarding nicotine patch for patient. Per [employer], via verbal order, nurse entered order for nicotine patch 14mg  to be ordered and initiated for patient. Nurse read back order to [employer] on telephone and entered verbal order.

## 2016-04-07 LAB — CBC
HEMATOCRIT: 32.9 % — AB (ref 36.0–46.0)
Hemoglobin: 11.2 g/dL — ABNORMAL LOW (ref 12.0–15.0)
MCH: 30.3 pg (ref 26.0–34.0)
MCHC: 34 g/dL (ref 30.0–36.0)
MCV: 88.9 fL (ref 78.0–100.0)
Platelets: 260 10*3/uL (ref 150–400)
RBC: 3.7 MIL/uL — ABNORMAL LOW (ref 3.87–5.11)
RDW: 15.1 % (ref 11.5–15.5)
WBC: 12.1 10*3/uL — ABNORMAL HIGH (ref 4.0–10.5)

## 2016-04-07 LAB — BASIC METABOLIC PANEL
Anion gap: 6 (ref 5–15)
BUN: 16 mg/dL (ref 6–20)
CALCIUM: 8.9 mg/dL (ref 8.9–10.3)
CHLORIDE: 102 mmol/L (ref 101–111)
CO2: 27 mmol/L (ref 22–32)
Creatinine, Ser: 0.85 mg/dL (ref 0.44–1.00)
GFR calc non Af Amer: >60 mL/min (ref >60)
Glucose, Bld: 219 mg/dL — ABNORMAL HIGH (ref 65–99)
Potassium: 4.5 mmol/L (ref 3.5–5.1)
Sodium: 135 mmol/L (ref 135–145)

## 2016-04-07 LAB — GLUCOSE, CAPILLARY
GLUCOSE-CAPILLARY: 217 mg/dL — AB (ref 65–99)
Glucose-Capillary: 179 mg/dL — ABNORMAL HIGH (ref 65–99)
Glucose-Capillary: 158 mg/dL — ABNORMAL HIGH (ref 65–99)
Glucose-Capillary: 131 mg/dL — ABNORMAL HIGH (ref 65–99)

## 2016-04-07 MED ORDER — ORAL CARE MOUTH RINSE
15.0000 mL | Freq: Two times a day (BID) | OROMUCOSAL | Status: DC
  Administered 2016-04-07 – 2016-04-11 (×8): 15 mL via OROMUCOSAL

## 2016-04-07 NOTE — Evaluation (Signed)
Physical Therapy Evaluation Patient Details Name: Stephanie Fox MRN: [id number] DOB: 1950/11/07 Today's Date: 04/07/2016   History of Present Illness  s/p bil TKA  Clinical Impression  Pt is s/p TKA resulting in the deficits listed below (see PT Problem List).  Pt will benefit from skilled PT to increase their independence and safety with mobility to allow discharge to the venue listed below.  Pt is very motivated to D/C to her own home; will see how she progresses over the next few days     Follow Up Recommendations Home health PT;SNF (vs-lives alone, wants to go home)    Equipment Recommendations  Rolling walker with 5" wheels;3in1 (PT)    Recommendations for Other Services       Precautions / Restrictions Precautions Precautions: Knee Restrictions Weight Bearing Restrictions: No Other Position/Activity Restrictions: WBAT      Mobility  Bed Mobility Overal bed mobility: Needs Assistance Bed Mobility: Supine to Sit     Supine to sit: Min guard     General bed mobility comments: for safety and line management  Transfers Overall transfer level: Needs assistance Equipment used: Rolling walker (2 wheeled) Transfers: Sit to/from Stand Sit to Stand: From elevated surface;+2 physical assistance;Min assist         General transfer comment: verbal cues for LE/foot position, hand placement and wt shift  Ambulation/Gait Ambulation/Gait assistance: Min assist;+2 physical assistance;+2 safety/equipment Ambulation Distance (Feet): 22 Feet Assistive device: Rolling walker (2 wheeled) Gait Pattern/deviations: Step-to pattern;Decreased step length - left;Decreased stance time - right;Wide base of support;Trunk flexed Gait velocity: decr   General Gait Details: cues for RW position from self, sequence, posture  Stairs            Wheelchair Mobility    Modified Rankin (Stroke Patients Only)       Balance Overall balance assessment: Needs assistance   Sitting  balance-Leahy Scale: Fair       Standing balance-Leahy Scale: Poor Standing balance comment: heavily reliant on UEs                             Pertinent Vitals/Pain Pain Assessment: 0-10 Pain Score: 6  Pain Location: bil knees Pain Intervention(s): Limited activity within patient's tolerance;Monitored during session;Premedicated before session;Repositioned;Ice applied    Home Living Family/patient expects to be discharged to:: Unsure--has neighbors and friends that can check on her, assist with meals etc; also has a friend that she could go home with if needed but she prefers to go home vs SNF Living Arrangements: Alone   Type of Home: House Home Access: Stairs to enter Entrance Stairs-Rails: None Entrance Stairs-Number of Steps: 3 Home Layout: One level Home Equipment: None      Prior Function Level of Independence: Independent               Hand Dominance        Extremity/Trunk Assessment   Upper Extremity Assessment Upper Extremity Assessment: Defer to OT evaluation;Overall WFL for tasks assessed    Lower Extremity Assessment Lower Extremity Assessment: RLE deficits/detail;LLE deficits/detail RLE Deficits / Details: ankle WFL, knee extension and hip flexion 2+/5 LLE Deficits / Details: same as above       Communication   Communication: No difficulties  Cognition Arousal/Alertness: Awake/alert Behavior During Therapy: WFL for tasks assessed/performed Overall Cognitive Status: Within Functional Limits for tasks assessed  General Comments      Exercises Total Joint Exercises Ankle Circles/Pumps: AROM;Both;10 reps Quad Sets: 5 reps;AROM;Both   Assessment/Plan    PT Assessment Patient needs continued PT services  PT Problem List Decreased strength;Decreased range of motion;Decreased activity tolerance;Decreased knowledge of use of DME;Decreased mobility;Pain          PT Treatment Interventions DME  instruction;Gait training;Functional mobility training;Therapeutic activities;Therapeutic exercise;Stair training;Patient/family education    PT Goals (Current goals can be found in the Care Plan section)  Acute Rehab PT Goals Patient Stated Goal: to do well enough to go home! PT Goal Formulation: With patient Time For Goal Achievement: 04/14/16 Potential to Achieve Goals: Good    Frequency 7X/week   Barriers to discharge        Co-evaluation               End of Session Equipment Utilized During Treatment: Gait belt Activity Tolerance: Patient tolerated treatment well Patient left: with call bell/phone within reach;in chair           Time: [id number] PT Time Calculation (min) (ACUTE ONLY): 35 min   Charges:   PT Evaluation $PT Eval Low Complexity: 1 Procedure PT Treatments $Gait Training: 8-22 mins   PT G Codes:        Manhattan Mccuen April 08, 2016, 11:31 AM

## 2016-04-07 NOTE — Evaluation (Signed)
Occupational Therapy Evaluation Patient Details Name: Stephanie Fox MRN: [id number] DOB: [birth year] Today's Date: 04/07/2016    History of Present Illness s/p bil TKA   Clinical Impression   This 65 year old female was admitted for the above sx.  She will benefit from continued OT to increase safety and independence with adls.  Goals in acute are for min A.  Pt currently needs min/mod +2 assistance for sit to stand for adls.      Follow Up Recommendations  Supervision/Assistance - 24 hour;SNF (pt wants to go home)    Equipment Recommendations  3 in 1 bedside commode (tub bench, tba further)    Recommendations for Other Services       Precautions / Restrictions Precautions Precautions: Knee Restrictions Weight Bearing Restrictions: No Other Position/Activity Restrictions: WBAT      Mobility Bed Mobility Overal bed mobility: Needs Assistance Bed Mobility: Supine to Sit     Supine to sit: Min guard     General bed mobility comments: OOB by PT  Transfers Overall transfer level: Needs assistance Equipment used: Rolling walker (2 wheeled) Transfers: Sit to/from Stand Sit to Stand: Min assist;Mod assist;+2 physical assistance         General transfer comment: from recliner.  Cues for LE/UE placement when standing and walking feet forward when sitting    Balance Overall balance assessment: Needs assistance   Sitting balance-Leahy Scale: Fair       Standing balance-Leahy Scale: Poor Standing balance comment: heavily reliant on UEs                            ADL Overall ADL's : Needs assistance/impaired     Grooming: Set up;Sitting   Upper Body Bathing: Set up;Sitting   Lower Body Bathing: Moderate assistance;+2 for physical assistance;Sit to/from stand   Upper Body Dressing : Set up;Sitting   Lower Body Dressing: Maximal assistance;+2 for physical assistance;Sit to/from stand                 General ADL Comments: Stood during OT  session, but did not practice toilet transfer.  Pt has catheter in place.  Educated on AE, but did not use this session. Pt is able to lift each leg.   Also educated on concept of tub bench (if shower doors can be removed).       Vision     Perception     Praxis      Pertinent Vitals/Pain Pain Assessment: 0-10 Pain Score: 6  Pain Location: bil knees Pain Intervention(s): Limited activity within patient's tolerance;Monitored during session;Premedicated before session;Repositioned;Ice applied     Hand Dominance     Extremity/Trunk Assessment Upper Extremity Assessment Upper Extremity Assessment: Overall WFL for tasks assessed          Communication Communication Communication: No difficulties   Cognition Arousal/Alertness: Awake/alert Behavior During Therapy: WFL for tasks assessed/performed Overall Cognitive Status: Within Functional Limits for tasks assessed                     General Comments       Exercises       Shoulder Instructions      Home Living Family/patient expects to be discharged to:: Unsure Living Arrangements: Alone   Type of Home: House Home Access: Stairs to enter [employer] of Steps: 3 Entrance Stairs-Rails: None Home Layout: One level  Home Equipment: None   Additional Comments: has a standard commode and tub with shower doors      Prior Functioning/Environment Level of Independence: Independent                 OT Problem List: Decreased strength;Decreased activity tolerance;Decreased knowledge of use of DME or AE;Pain   OT Treatment/Interventions: Self-care/ADL training;DME and/or AE instruction;Patient/family education    OT Goals(Current goals can be found in the care plan section) Acute Rehab OT Goals Patient Stated Goal: to do well enough to go home! OT Goal Formulation: With patient Time For Goal Achievement: 04/14/16 Potential to Achieve Goals: Good ADL Goals Pt Will Perform  Grooming: with min guard assist;standing Pt Will Perform Lower Body Bathing: with min assist;with adaptive equipment;sit to/from stand Pt Will Perform Lower Body Dressing: with min assist;with adaptive equipment;sit to/from stand Pt Will Transfer to Toilet: with min assist;ambulating;bedside commode Pt Will Perform Toileting - Clothing Manipulation and hygiene: with min assist;sit to/from stand Pt Will Perform Tub/Shower Transfer: Tub transfer;tub bench;ambulating;with min assist  OT Frequency: Min 2X/week   Barriers to D/C:            Co-evaluation              End of Session    Activity Tolerance: Patient tolerated treatment well Patient left: in chair;with call bell/phone within reach   Time: 1138-1203 OT Time Calculation (min): 25 min Charges:  OT General Charges $OT Visit: 1 Procedure OT Evaluation $OT Eval Low Complexity: 1 Procedure G-Codes:    Niles Ess 04/25/16, 1:33 PM [name], OTR/L [phone] 04/25/16

## 2016-04-07 NOTE — Progress Notes (Signed)
Physical Therapy Treatment Patient Details Name: Stephanie Fox MRN: [id number] DOB: March 27, 1951 Today's Date: 04/10/16    History of Present Illness s/p bil TKA    PT Comments    Pt progressing well; slight knee instability during gait and continues to require +2 for safety  Follow Up Recommendations  Home health PT;SNF     Equipment Recommendations  Rolling walker with 5" wheels;3in1 (PT)    Recommendations for Other Services       Precautions / Restrictions Precautions Precautions: Knee Restrictions Other Position/Activity Restrictions: WBAT    Mobility  Bed Mobility         Supine to sit: Min assist     General bed mobility comments: light assist with LEs onto bed  Transfers Overall transfer level: Needs assistance Equipment used: Rolling walker (2 wheeled) Transfers: Sit to/from Stand Sit to Stand: Min assist;+2 physical assistance;+2 safety/equipment         General transfer comment: cues for hand placement and wt shift; pt had LOB on initial standing and sat back into chair  Ambulation/Gait Ambulation/Gait assistance: Min assist;+2 physical assistance;+2 safety/equipment Ambulation Distance (Feet): 38 Feet Assistive device: Rolling walker (2 wheeled) Gait Pattern/deviations: Step-to pattern;Decreased step length - left;Decreased stance time - right;Wide base of support;Trunk flexed Gait velocity: decr   General Gait Details: cues for RW position from self, sequence, posture   Stairs            Wheelchair Mobility    Modified Rankin (Stroke Patients Only)       Balance                                    Cognition Arousal/Alertness: Awake/alert Behavior During Therapy: WFL for tasks assessed/performed Overall Cognitive Status: Within Functional Limits for tasks assessed                      Exercises Total Joint Exercises Ankle Circles/Pumps: AROM;Both;10 reps Quad Sets: AROM;Both;10 reps Heel Slides:  AAROM;Both;10 reps Straight Leg Raises: AROM;Both;5 reps;Strengthening    General Comments        Pertinent Vitals/Pain Pain Score: 5  Pain Location: bil knees Pain Intervention(s): Limited activity within patient's tolerance;Monitored during session;Premedicated before session;Repositioned;Ice applied    Home Living                      Prior Function            PT Goals (current goals can now be found in the care plan section) Acute Rehab PT Goals Patient Stated Goal: to do well enough to go home! PT Goal Formulation: With patient Time For Goal Achievement: 04/14/16 Potential to Achieve Goals: Good Progress towards PT goals: Progressing toward goals    Frequency    7X/week      PT Plan Current plan remains appropriate    Co-evaluation             End of Session Equipment Utilized During Treatment: Gait belt Activity Tolerance: Patient tolerated treatment well Patient left: in bed;with call bell/phone within reach;with bed alarm set;with family/visitor present     Time: [id number] PT Time Calculation (min) (ACUTE ONLY): 42 min  Charges:  $Gait Training: 23-37 mins                    G Codes:      Stephanie Fox 04/10/2016, 5:04 PM

## 2016-04-07 NOTE — Progress Notes (Signed)
   Subjective: 1 Day Post-Op Procedure(s) (LRB): BILATERAL TOTAL KNEE ARTHROPLASTY (Bilateral)  Pt doing well this morning Pain is mild currently  Denies any new symptoms or issues  Ready for therapy today Patient reports pain as mild.  Objective:   VITALS:   Vitals:   04/07/16 0200 04/07/16 0546  BP: (!) 156/84 125/67  Pulse: 68 67  Resp: 14 15  Temp: 98.3 F (36.8 C) 97.8 F (36.6 C)    Bilateral knee dressings intact nv intact distally No rashes or edema distally  LABS  Recent Labs  04/07/16 0414  HGB 11.2*  HCT 32.9*  WBC 12.1*  PLT 260     Recent Labs  04/07/16 0414  NA 135  K 4.5  BUN 16  CREATININE 0.85  GLUCOSE 219*     Assessment/Plan: 1 Day Post-Op Procedure(s) (LRB): BILATERAL TOTAL KNEE ARTHROPLASTY (Bilateral) Pain management today PT/OT  Overall doing well this morning Pulmonary toilet   Merla Riches, MPAS, PA-C  04/07/2016, 7:27 AM

## 2016-04-08 LAB — CBC
HCT: 29.4 % — ABNORMAL LOW (ref 36.0–46.0)
Hemoglobin: 10 g/dL — ABNORMAL LOW (ref 12.0–15.0)
MCH: 30.1 pg (ref 26.0–34.0)
MCHC: 34 g/dL (ref 30.0–36.0)
MCV: 88.6 fL (ref 78.0–100.0)
Platelets: 234 10*3/uL (ref 150–400)
RBC: 3.32 MIL/uL — ABNORMAL LOW (ref 3.87–5.11)
RDW: 15.1 % (ref 11.5–15.5)
WBC: 11.6 10*3/uL — ABNORMAL HIGH (ref 4.0–10.5)

## 2016-04-08 LAB — BASIC METABOLIC PANEL
Anion gap: 5 (ref 5–15)
BUN: 17 mg/dL (ref 6–20)
CHLORIDE: 102 mmol/L (ref 101–111)
CO2: 25 mmol/L (ref 22–32)
CREATININE: 0.76 mg/dL (ref 0.44–1.00)
Calcium: 9.2 mg/dL (ref 8.9–10.3)
GFR calc Af Amer: >60 mL/min (ref >60)
GFR calc non Af Amer: >60 mL/min (ref >60)
Glucose, Bld: 207 mg/dL — ABNORMAL HIGH (ref 65–99)
Potassium: 4.5 mmol/L (ref 3.5–5.1)
SODIUM: 132 mmol/L — AB (ref 135–145)

## 2016-04-08 LAB — GLUCOSE, CAPILLARY
GLUCOSE-CAPILLARY: 199 mg/dL — AB (ref 65–99)
Glucose-Capillary: 157 mg/dL — ABNORMAL HIGH (ref 65–99)
Glucose-Capillary: 143 mg/dL — ABNORMAL HIGH (ref 65–99)

## 2016-04-08 NOTE — Progress Notes (Signed)
Occupational Therapy Treatment Patient Details Name: Stephanie Fox MRN: [id number] DOB: September 29, 1950 Today's Date: 04/08/2016    History of present illness s/p bil TKA   OT comments  Pt making good progress  Follow Up Recommendations  Supervision/Assistance - 24 hour;Home health OT (pt wants to go home)    Equipment Recommendations  3 in 1 bedside commode (tub bench, tba further)    Recommendations for Other Services      Precautions / Restrictions Precautions Precautions: Knee Restrictions Other Position/Activity Restrictions: WBAT       Mobility Bed Mobility Overal bed mobility: Needs Assistance       Supine to sit: Min assist     General bed mobility comments:  (in chair)  Transfers Overall transfer level: Needs assistance Equipment used: Rolling walker (2 wheeled) Transfers: Sit to/from [facility] Sit to Stand: Min guard Stand pivot transfers: Min guard       General transfer comment: cues for hand placement and wt shift;     Balance               Standing balance comment: less reliance on UEs but still needing UE support for balance                   ADL Overall ADL's : Needs assistance/impaired     Grooming: Set up;Standing   Upper Body Bathing: Set up;Sitting   Lower Body Bathing: Minimal assistance;Sit to/from stand;Cueing for safety;Cueing for sequencing   Upper Body Dressing : Set up;Sitting   Lower Body Dressing: Minimal assistance;Sit to/from stand;Cueing for sequencing;Cueing for safety;Cueing for compensatory techniques   Toilet Transfer: Min guard;RW;Ambulation;BSC   Toileting- Clothing Manipulation and Hygiene: Supervision/safety;Set up;Sit to/from stand;Cueing for safety;Cueing for sequencing                   Perception     Praxis      Cognition   Behavior During Therapy: [facility] for tasks assessed/performed Overall Cognitive Status: Within Functional Limits for tasks assessed                                     Pertinent Vitals/ Pain       Pain Score: 5  Pain Location: bil knees Pain Descriptors / Indicators: Sore Pain Intervention(s): Monitored during session;Repositioned;Ice applied         Frequency  Min 2X/week        Progress Toward Goals  OT Goals(current goals can now be found in the care plan section)  Progress towards OT goals: Progressing toward goals  Acute Rehab OT Goals Patient Stated Goal: to do well enough to go home! OT Goal Formulation: With patient  Plan         End of Session     Activity Tolerance Patient tolerated treatment well   Patient Left in chair;with call bell/phone within reach           Time: 1218-1244 OT Time Calculation (min): 26 min  Charges: OT General Charges $OT Visit: 1 Procedure OT Treatments $Self Care/Home Management : 23-37 mins  Stephanie Fox, Thereasa Parkin 04/08/2016, 12:58 PM

## 2016-04-08 NOTE — Progress Notes (Signed)
Physical Therapy Treatment Patient Details Name: Stephanie Fox MRN: [id number] DOB: 1951/02/03 Today's Date: 04/08/2016    History of Present Illness s/p bil TKA    PT Comments    Pt progressing very well; PT limited gait distance this pm d/t pt amb 90' this am and also awaiting pain meds at this time; pt very willing to work within limits of pain; awaiting rehab consult; will continue to follow   Follow Up Recommendations  Home health PT;SNF;CIR     Equipment Recommendations  Rolling walker with 5" wheels;3in1 (PT)    Recommendations for Other Services       Precautions / Restrictions Precautions Precautions: Knee Restrictions Other Position/Activity Restrictions: WBAT    Mobility  Bed Mobility Overal bed mobility: Needs Assistance Bed Mobility: Sit to Supine     Supine to sit: Min assist Sit to supine: Supervision   General bed mobility comments: for safety only, no physical assist  Transfers Overall transfer level: Needs assistance Equipment used: Rolling walker (2 wheeled) Transfers: Sit to/from Stand Sit to Stand: Min guard Stand pivot transfers: Min guard       General transfer comment: min/guard for to steady during transition to RW, cues to go slow  speed of transition  Ambulation/Gait Ambulation/Gait assistance: Min guard Ambulation Distance (Feet): 25 Feet Assistive device: Rolling walker (2 wheeled) Gait Pattern/deviations: Step-to pattern;Decreased step length - left;Decreased stance time - right;Wide base of support;Trunk flexed Gait velocity: decr   General Gait Details: cues for RW position from self, gait progression, incr wt on LEs as tol,  posture   Stairs            Wheelchair Mobility    Modified Rankin (Stroke Patients Only)       Balance               Standing balance comment: less reliance on UEs but still needing UE support for balance                    Cognition Arousal/Alertness:  Awake/alert Behavior During Therapy: WFL for tasks assessed/performed Overall Cognitive Status: Within Functional Limits for tasks assessed                      Exercises Total Joint Exercises Ankle Circles/Pumps: AROM;Both;10 reps Quad Sets: AROM;Both;10 reps Short Arc Quad: AROM;Strengthening;Both;10 reps Heel Slides: AAROM;Both;10 reps;AROM Hip ABduction/ADduction: AROM;Strengthening;Both;10 reps Straight Leg Raises: AROM;Strengthening;Both;10 reps Goniometric ROM: R knee ~ -10 to 60* AAROM, flexion; L knee ~-10 to 70*    General Comments        Pertinent Vitals/Pain Pain Assessment: 0-10 Pain Score: 6  Pain Location: bil knees Pain Descriptors / Indicators: Sore Pain Intervention(s): Limited activity within patient's tolerance;Monitored during session;Patient requesting pain meds-RN notified;Ice applied;Repositioned    Home Living                      Prior Function            PT Goals (current goals can now be found in the care plan section) Acute Rehab PT Goals Patient Stated Goal: to do well enough to go home! PT Goal Formulation: With patient Time For Goal Achievement: 04/14/16 Potential to Achieve Goals: Good Progress towards PT goals: Progressing toward goals    Frequency    7X/week      PT Plan Current plan remains appropriate    Co-evaluation  End of Session Equipment Utilized During Treatment: Gait belt Activity Tolerance: Patient tolerated treatment well Patient left: in bed;with call bell/phone within reach;with family/visitor present     Time: [id number] PT Time Calculation (min) (ACUTE ONLY): 29 min  Charges:  $Gait Training: 23-37 mins $Therapeutic Exercise: 23-37 mins                    G Codes:      Stephanie Fox 05-02-2016, 2:43 PM

## 2016-04-08 NOTE — Progress Notes (Signed)
Occupational Therapy Treatment Patient Details Name: Stephanie Fox MRN: [id number] DOB: 05/20/1950 Today's Date: 04/08/2016    History of present illness s/p bil TKA   OT comments  Pt doing well- pt will work with OT tomorrow as well  Follow Up Recommendations  Supervision/Assistance - 24 hour;Home health OT (pt wants to go home)    Equipment Recommendations  3 in 1 bedside commode (tub bench, tba further)       Precautions / Restrictions Precautions Precautions: Knee Restrictions Other Position/Activity Restrictions: WBAT       Mobility Bed Mobility Overal bed mobility: Needs Assistance       Supine to sit: Min assist     General bed mobility comments:  (in chair)  Transfers Overall transfer level: Needs assistance Equipment used: Rolling walker (2 wheeled) Transfers: Sit to/from [facility] Sit to Stand: Min assist Stand pivot transfers: Min assist       General transfer comment: cues for hand placement and wt shift;     Balance               Standing balance comment: less reliance on UEs but still needing UE support for balance                   ADL Overall ADL's : Needs assistance/impaired                     Lower Body Dressing: Moderate assistance;Sit to/from stand;Cueing for safety;Cueing for sequencing;Cueing for compensatory techniques   Toilet Transfer: Moderate assistance;RW;BSC;Stand-pivot;Cueing for sequencing;Cueing for safety   Toileting- Clothing Manipulation and Hygiene: Minimal assistance;Sit to/from stand;Cueing for safety;Cueing for sequencing                      Praxis      Cognition   Behavior During Therapy: [facility] for tasks assessed/performed Overall Cognitive Status: Within Functional Limits for tasks assessed                         Exercises Total Joint Exercises Ankle Circles/Pumps: AROM;Both;10 reps Quad Sets: AROM;Both;10 reps           Pertinent Vitals/  Pain       Pain Score: 4  Pain Location: bil knees Pain Descriptors / Indicators: Sore Pain Intervention(s): Monitored during session;Repositioned;Ice applied         Frequency  Min 2X/week        Progress Toward Goals  OT Goals(current goals can now be found in the care plan section)     Acute Rehab OT Goals Patient Stated Goal: to do well enough to go home! OT Goal Formulation: With patient  Plan         End of Session     Activity Tolerance Patient tolerated treatment well   Patient Left in chair;with call bell/phone within reach   Nurse Communication          Time: [id number] OT Time Calculation (min): 15 min  Charges: OT General Charges $OT Visit: 1 Procedure OT Treatments $Self Care/Home Management : 8-22 mins  Abubakar Crispo, Thereasa Parkin 04/08/2016, 12:07 PM

## 2016-04-08 NOTE — Progress Notes (Signed)
Patient ID: Stephanie Fox, female   DOB: 09-01-1950, 65 y.o.   MRN: [id number] Subjective: 2 Days Post-Op Procedure(s) (LRB): BILATERAL TOTAL KNEE ARTHROPLASTY (Bilateral)    Patient reports pain as moderate, challenged more with her right knee than left.  No events at this point  Objective:   VITALS:   Vitals:   04/07/16 2149 04/08/16 0500  BP: 128/61 116/69  Pulse: 85 86  Resp: 18 16  Temp: 99.3 F (37.4 C) 98.5 F (36.9 C)    Neurovascular intact Incision: dressing C/D/I, bilateral knee  LABS  Recent Labs  04/07/16 0414 04/08/16 0414  HGB 11.2* 10.0*  HCT 32.9* 29.4*  WBC 12.1* 11.6*  PLT 260 234     Recent Labs  04/07/16 0414 04/08/16 0414  NA 135 132*  K 4.5 4.5  BUN 16 17  CREATININE 0.85 0.76  GLUCOSE 219* 207*    No results for input(s): LABPT, INR in the last 72 hours.   Assessment/Plan: 2 Days Post-Op Procedure(s) (LRB): BILATERAL TOTAL KNEE ARTHROPLASTY (Bilateral)   Up with therapy Discharge to SNF versus CIR tomorrow - PENDING Reviewed goals

## 2016-04-08 NOTE — Progress Notes (Signed)
Physical Therapy Treatment Patient Details Name: Stephanie Fox MRN: [id number] DOB: June 11, 1950 Today's Date: May 07, 2016    History of Present Illness s/p bil TKA    PT Comments    Pt progressing well; incr gait tolerance today  Follow Up Recommendations  Home health PT;SNF;CIR     Equipment Recommendations  Rolling walker with 5" wheels;3in1 (PT)    Recommendations for Other Services       Precautions / Restrictions Precautions Precautions: Knee Restrictions Other Position/Activity Restrictions: WBAT    Mobility  Bed Mobility               General bed mobility comments:  (in chair)  Transfers Overall transfer level: Needs assistance Equipment used: Rolling walker (2 wheeled) Transfers: Sit to/from Stand Sit to Stand: Min assist;Min guard         General transfer comment: cues for hand placement and wt shift;   Ambulation/Gait Ambulation/Gait assistance: Min guard;Min assist Ambulation Distance (Feet): 90 Feet Assistive device: Rolling walker (2 wheeled) Gait Pattern/deviations: Step-to pattern;Decreased step length - left;Decreased stance time - right;Wide base of support;Trunk flexed     General Gait Details: cues for RW position from self, sequence, posture   Stairs            Wheelchair Mobility    Modified Rankin (Stroke Patients Only)       Balance               Standing balance comment: less reliance on UEs but still needing UE support for balance                    Cognition Arousal/Alertness: Awake/alert Behavior During Therapy: WFL for tasks assessed/performed Overall Cognitive Status: Within Functional Limits for tasks assessed                      Exercises Total Joint Exercises Ankle Circles/Pumps: AROM;Both;10 reps Quad Sets: AROM;Both;10 reps    General Comments        Pertinent Vitals/Pain Pain Score: 4  Pain Location: bil knees Pain Intervention(s): Limited activity within patient's  tolerance;Monitored during session;Premedicated before session;Ice applied    Home Living                      Prior Function            PT Goals (current goals can now be found in the care plan section) Acute Rehab PT Goals Patient Stated Goal: to do well enough to go home! PT Goal Formulation: With patient Time For Goal Achievement: 04/14/16 Potential to Achieve Goals: Good Progress towards PT goals: Progressing toward goals    Frequency    7X/week      PT Plan Current plan remains appropriate    Co-evaluation             End of Session Equipment Utilized During Treatment: Gait belt Activity Tolerance: Patient tolerated treatment well Patient left: in chair;with call bell/phone within reach     Time: 1036-1106 PT Time Calculation (min) (ACUTE ONLY): 30 min  Charges:  $Gait Training: 23-37 mins                    G Codes:      Stephanie Fox 2016-05-07, 11:23 AM

## 2016-04-09 ENCOUNTER — Encounter (HOSPITAL_COMMUNITY): Payer: Self-pay | Admitting: Orthopedic Surgery

## 2016-04-09 LAB — GLUCOSE, CAPILLARY
Glucose-Capillary: 150 mg/dL — ABNORMAL HIGH (ref 65–99)
Glucose-Capillary: 147 mg/dL — ABNORMAL HIGH (ref 65–99)
Glucose-Capillary: 183 mg/dL — ABNORMAL HIGH (ref 65–99)
Glucose-Capillary: 192 mg/dL — ABNORMAL HIGH (ref 65–99)

## 2016-04-09 MED ORDER — RIVAROXABAN 10 MG PO TABS: 10.0000 mg | ORAL_TABLET | ORAL | 0 refills | Status: DC

## 2016-04-09 MED ORDER — OXYCODONE HCL 5 MG PO TABS: 5.0000 mg | ORAL_TABLET | ORAL | 0 refills | Status: DC | PRN

## 2016-04-09 MED ORDER — FERROUS SULFATE 325 (65 FE) MG PO TABS: 325.0000 mg | ORAL_TABLET | Freq: Three times a day (TID) | ORAL | 3 refills | Status: DC

## 2016-04-09 MED ORDER — DOCUSATE SODIUM 100 MG PO CAPS: 100.0000 mg | ORAL_CAPSULE | Freq: Two times a day (BID) | ORAL | 0 refills | Status: DC

## 2016-04-09 MED ORDER — POLYETHYLENE GLYCOL 3350 17 G PO PACK: 17.0000 g | PACK | Freq: Two times a day (BID) | ORAL | 0 refills | Status: DC

## 2016-04-09 MED ORDER — ASPIRIN EC 81 MG PO TBEC: 81.0000 mg | DELAYED_RELEASE_TABLET | Freq: Two times a day (BID) | ORAL | 0 refills | Status: AC

## 2016-04-09 MED ORDER — ACETAMINOPHEN 500 MG PO TABS: 1000.0000 mg | ORAL_TABLET | Freq: Three times a day (TID) | ORAL | 0 refills | Status: DC

## 2016-04-09 MED ORDER — METHOCARBAMOL 500 MG PO TABS: 500.0000 mg | ORAL_TABLET | Freq: Four times a day (QID) | ORAL | 0 refills | Status: DC | PRN

## 2016-04-09 MED ORDER — ONDANSETRON HCL 4 MG PO TABS: 4.0000 mg | ORAL_TABLET | Freq: Four times a day (QID) | ORAL | 0 refills | Status: DC | PRN

## 2016-04-09 NOTE — Progress Notes (Signed)
Occupational Therapy Treatment Patient Details Name: Stephanie Fox MRN: [id number] DOB: 1951-03-18 Today's Date: 04/09/2016    History of present illness s/p bil TKA   OT comments  Pt much improved this afternoon!   Follow Up Recommendations  Home health OT;Supervision - Intermittent    Equipment Recommendations  3 in 1 bedside commode       Precautions / Restrictions Precautions Precautions: Knee Restrictions Other Position/Activity Restrictions: WBAT       Mobility Bed Mobility Overal bed mobility: Needs Assistance Bed Mobility: Supine to Sit;Sit to Supine     Supine to sit: Supervision Sit to supine: Supervision   General bed mobility comments: for safety only, no physical assist  Transfers Overall transfer level: Needs assistance Equipment used: Rolling walker (2 wheeled) Transfers: Sit to/from Stand Sit to Stand: Supervision Stand pivot transfers: Supervision       General transfer comment: cues for safety, technique, control of descent        ADL Overall ADL's : Needs assistance/impaired     Grooming: Standing;Wash/dry face;Wash/dry hands;Supervision/safety               Lower Body Dressing: Supervision/safety;Cueing for safety;Cueing for sequencing;Sit to/from stand   Toilet Transfer: Supervision/safety;RW;Ambulation;Cueing for safety;Comfort height toilet Toilet Transfer Details (indicate cue type and reason): pt walked to bathroom Toileting- Clothing Manipulation and Hygiene: Supervision/safety;Sit to/from stand Toileting - Clothing Manipulation Details (indicate cue type and reason): pt needed to stand for hygiene.     Tub/Shower Transfer Details (indicate cue type and reason): did not perform - did verbalize technique and safety   General ADL Comments: pt much improved this afternoon.  Pt able to walk to bathroom, practice with AE.  ( AE issued as pt is workers comp) Pt will benefit from [facility] to ensure home safety with ADL activity                  Cognition   Behavior During Therapy: WFL for tasks assessed/performed Overall Cognitive Status: Within Functional Limits for tasks assessed                               General Comments      Pertinent Vitals/ Pain       Pain Assessment: 0-10 Pain Score: 3  Pain Location: Bilateral knees Pain Descriptors / Indicators: Sore Pain Intervention(s): Monitored during session;Ice applied         Frequency  Min 2X/week        Progress Toward Goals  OT Goals(current goals can now be found in the care plan section)  Progress towards OT goals: Progressing toward goals  Acute Rehab OT Goals Patient Stated Goal: to do well enough to go home!  Plan Discharge plan remains appropriate       End of Session Equipment Utilized During Treatment: Rolling walker   Activity Tolerance Patient tolerated treatment well   Patient Left in chair;with call bell/phone within reach   Nurse Communication Mobility status        Time: 1556-1630 OT Time Calculation (min): 34 min  Charges: OT General Charges $OT Visit: 1 Procedure OT Treatments $Self Care/Home Management : 23-37 mins  Stephanie Fox, Thereasa Parkin 04/09/2016, 5:39 PM

## 2016-04-09 NOTE — Progress Notes (Signed)
Rehab admissions - Patient is progressing well and likely can discharge home with [facility] therapies.  Call me for questions.  [phone]

## 2016-04-09 NOTE — Progress Notes (Signed)
Spoke with patient at bedside. CIR is denied, patient plans to d/c to home with assistance from friends. Several attempts made to contact workers comp to arrange [facility], multiple numbers tried but only spoke with a live person once, they provided a number that did not connect with a source. Several attempts at calling back some of the numbers again have been futile. Patient gave me Leonides Grills, Worker Comp. Representative for her employer cell: [phone]. He gave me a website, unable to access site as I did not have a log in. Contacted Dr. Aurea Graff office and spoke with their workers comp claims person, after trouble shooting with office staff she suggested contacting [employer] to see if they could obtain auth. Contacted Tim with [employer] and he will research if they can provide services. Plan for d/c in am.

## 2016-04-09 NOTE — Progress Notes (Signed)
Physical Therapy Treatment Patient Details Name: Stephanie Fox MRN: [id number] DOB: 1951/04/04 Today's Date: 04/09/2016    History of Present Illness s/p bil TKA    PT Comments    Lengthy discussion with pt regarding D/C plan, options/explanations etc; pt is very upset regarding D/C plan although she is motivated to do PT and be IND; she is concerned about pain control at home; pt was nauseous and dizzy during OT session--likely d/t IV  pain meds; pt can't move about her home in w/c d/t space constraints--yesterday she was progressing well with amb, will see how she does this afternoon;  Pt states she could go stay with her friend "Arville Go", have encouraged pt to consider this, she appears to have a lot of support but is used to being very independent; will continue to follow   Follow Up Recommendations  Home health PT;Supervision for mobility/OOB (initial supervision)     Equipment Recommendations  Rolling walker with 5" wheels;3in1 (PT)    Recommendations for Other Services       Precautions / Restrictions Precautions Precautions: Knee Restrictions Weight Bearing Restrictions: No Other Position/Activity Restrictions: WBAT    Mobility  Bed Mobility Overal bed mobility: Needs Assistance Bed Mobility: Sit to Supine       Sit to supine: Supervision   General bed mobility comments:  (OOB to chair with OT)  Transfers Overall transfer level: Needs assistance Equipment used: Rolling walker (2 wheeled) Transfers: Sit to/from [facility] Sit to Stand: Min assist Stand pivot transfers: Min assist       General transfer comment:  (deferred xfers/amb d/t almost time for pain meds)  Ambulation/Gait                 Stairs            Wheelchair Mobility    Modified Rankin (Stroke Patients Only)       Balance                                    Cognition Arousal/Alertness: Awake/alert Behavior During Therapy: WFL for tasks  assessed/performed Overall Cognitive Status: Within Functional Limits for tasks assessed                      Exercises Total Joint Exercises Ankle Circles/Pumps: AROM;Both;10 reps Quad Sets: AROM;Both;10 reps Heel Slides: AAROM;Both;10 reps;AROM Hip ABduction/ADduction: AROM;Strengthening;Both;10 reps Straight Leg Raises: AROM;Strengthening;Both;10 reps Goniometric ROM: R knee -12 to 50*; L knee ~-12 to 65* AAROM knee flexion     General Comments        Pertinent Vitals/Pain Pain Assessment: 0-10 Pain Score: 6  Pain Location: bilateral knees Pain Descriptors / Indicators: Sore;Tightness Pain Intervention(s): Limited activity within patient's tolerance;Monitored during session;Repositioned;Ice applied    Home Living                      Prior Function            PT Goals (current goals can now be found in the care plan section) Acute Rehab PT Goals Patient Stated Goal: to do well enough to go home! PT Goal Formulation: With patient Time For Goal Achievement: 04/14/16 Potential to Achieve Goals: Good Progress towards PT goals: Progressing toward goals    Frequency    7X/week      PT Plan Current plan remains appropriate    Co-evaluation  End of Session   Activity Tolerance: Patient tolerated treatment well;Other (comment) (see note) Patient left: in chair;with call bell/phone within reach     Time: 1208-1254 PT Time Calculation (min) (ACUTE ONLY): 46 min  Charges:  $Therapeutic Exercise: 23-37 mins $Self Care/Home Management: 8-22                    G Codes:      Nancee Brownrigg 04/27/2016, 1:26 PM

## 2016-04-09 NOTE — Progress Notes (Signed)
   04/09/16 1400  PT Visit Information  Last PT Received On 04/09/16 pt feeling better this pm, pain well controlled; will see in am  Assistance Needed +1  History of Present Illness s/p bil TKA  Subjective Data  Patient Stated Goal to do well enough to go home!  Precautions  Precautions Knee  Restrictions  Other Position/Activity Restrictions WBAT  Pain Assessment  Pain Assessment 0-10  Pain Score 3  Pain Location bilateral knees  Pain Descriptors / Indicators Sore;Tightness  Pain Intervention(s) Monitored during session;Ice applied  Cognition  Arousal/Alertness Awake/alert  Behavior During Therapy WFL for tasks assessed/performed  Overall Cognitive Status Within Functional Limits for tasks assessed  Bed Mobility  Overal bed mobility Needs Assistance  Bed Mobility Supine to Sit;Sit to Supine  Supine to sit Supervision  Sit to supine Supervision  General bed mobility comments for safety only, no physical assist  Transfers  Overall transfer level Needs assistance  Equipment used Rolling walker (2 wheeled)  Transfers Sit to/from Stand  Sit to Stand Min guard;Supervision  General transfer comment cues for safety, technique, control of descent  Ambulation/Gait  Ambulation/Gait assistance Min guard  Ambulation Distance (Feet) 80 Feet  Assistive device Rolling walker (2 wheeled)  Gait Pattern/deviations Step-to pattern;Decreased step length - left;Decreased stance time - right;Wide base of support;Trunk flexed  General Gait Details cues for RW position from self, gait progression, incr wt on LEs as tol,  posture  Gait velocity decr  PT - End of Session  Equipment Utilized During Treatment Gait belt  Activity Tolerance Patient tolerated treatment well  Patient left in bed;with call bell/phone within reach (rails x4 per pt request)  PT - Assessment/Plan  PT Plan Current plan remains appropriate  PT Frequency (ACUTE ONLY) 7X/week  Follow Up Recommendations Home health  PT;Supervision for mobility/OOB  PT equipment Rolling walker with 5" wheels;3in1 (PT)  PT Goal Progression  Progress towards PT goals Progressing toward goals  Acute Rehab PT Goals  PT Goal Formulation With patient  Time For Goal Achievement 04/14/16  Potential to Achieve Goals Good  PT Time Calculation  PT Start Time (ACUTE ONLY) 1438  PT Stop Time (ACUTE ONLY) 1454  PT Time Calculation (min) (ACUTE ONLY) 16 min  PT General Charges  $$ ACUTE PT VISIT 1 Procedure  PT Treatments  $Gait Training 8-22 mins

## 2016-04-09 NOTE — Progress Notes (Signed)
Occupational Therapy Treatment Patient Details Name: Selenia Noone MRN: [id number] DOB: 15-May-1950 Today's Date: 04/09/2016    History of present illness s/p bil TKA   OT comments  Pt not feeling as well this day. Feeling nauseous- RN aware.    Follow Up Recommendations  Supervision/Assistance - 24 hour;Home health OT    Equipment Recommendations  3 in 1 bedside commode    Recommendations for Other Services      Precautions / Restrictions Precautions Precautions: Knee Restrictions Weight Bearing Restrictions: No Other Position/Activity Restrictions: WBAT       Mobility Bed Mobility Overal bed mobility: Needs Assistance Bed Mobility: Sit to Supine       Sit to supine: Supervision   General bed mobility comments: for safety only, no physical assist  Transfers Overall transfer level: Needs assistance Equipment used: Rolling walker (2 wheeled) Transfers: Sit to/from [facility] Sit to Stand: Min assist Stand pivot transfers: Min assist       General transfer comment: needed increased A this day    Balance                                   ADL                           Toilet Transfer: RW;Ambulation;BSC;Minimal assistance   Toileting- Clothing Manipulation and Hygiene: Supervision/safety;Set up;Sit to/from stand;Cueing for safety;Cueing for sequencing         General ADL Comments: pt not feeling well this am- RN aware.        Vision                            Cognition   Behavior During Therapy: WFL for tasks assessed/performed Overall Cognitive Status: Within Functional Limits for tasks assessed                               General Comments      Pertinent Vitals/ Pain       Pain Score: 7  Pain Location: bilateral knees Pain Descriptors / Indicators: Sore Pain Intervention(s): Monitored during session         Frequency  Min 2X/week        Progress Toward Goals  OT  Goals(current goals can now be found in the care plan section)     Acute Rehab OT Goals Patient Stated Goal: to do well enough to go home!  Plan Discharge plan remains appropriate       End of Session     Activity Tolerance Patient tolerated treatment well   Patient Left in chair;with call bell/phone within reach   Nurse Communication Mobility status        Time: [id number] OT Time Calculation (min): 30 min  Charges: OT General Charges $OT Visit: 1 Procedure OT Treatments $Self Care/Home Management : 23-37 mins  Dashiel Bergquist, Thereasa Parkin 04/09/2016, 11:11 AM

## 2016-04-09 NOTE — Progress Notes (Signed)
CSW consulted for SNF placement. CIR has signed off noting pt shoudl progress to dc home with [place]. Met with pt this am, reviewed progress with PT,OT,PA. Therapist, PA are planning for d/c home tomorrow with [facility] services. RNCM will assist with d/c planning.  CSW signing off.  Werner Lean LCSW [phone]

## 2016-04-10 LAB — GLUCOSE, CAPILLARY
GLUCOSE-CAPILLARY: 134 mg/dL — AB (ref 65–99)
GLUCOSE-CAPILLARY: 166 mg/dL — AB (ref 65–99)
Glucose-Capillary: 142 mg/dL — ABNORMAL HIGH (ref 65–99)
Glucose-Capillary: 175 mg/dL — ABNORMAL HIGH (ref 65–99)

## 2016-04-10 NOTE — Progress Notes (Signed)
Physical Therapy Treatment Patient Details Name: Stephanie Fox MRN: [id number] DOB: 18-Sep-1950 Today's Date: 04/10/2016    History of Present Illness s/p bil TKA    PT Comments    Pt doing well, states she can't get up by herself but she actually is not really requiring any physical assist, only close supervision to guarding for safety; incr amb distance today  Follow Up Recommendations  Home health PT;Supervision for mobility/OOB     Equipment Recommendations  Rolling walker with 5" wheels;3in1 (PT)    Recommendations for Other Services       Precautions / Restrictions Precautions Precautions: Knee Restrictions Weight Bearing Restrictions: No Other Position/Activity Restrictions: WBAT    Mobility  Bed Mobility Overal bed mobility: Needs Assistance Bed Mobility: Supine to Sit     Supine to sit: Supervision     General bed mobility comments: for safety only, no physical assist  Transfers Overall transfer level: Needs assistance Equipment used: Rolling walker (2 wheeled) Transfers: Sit to/from Stand Sit to Stand: Min guard;Supervision         General transfer comment: cues for safety, technique, control of descent  Ambulation/Gait Ambulation/Gait assistance: Min guard Ambulation Distance (Feet): 120 Feet Assistive device: Rolling walker (2 wheeled) Gait Pattern/deviations: Step-to pattern;Decreased step length - left;Decreased stance time - right;Wide base of support;Trunk flexed     General Gait Details: occasional cues for RW position and safety   Stairs            Wheelchair Mobility    Modified Rankin (Stroke Patients Only)       Balance Overall balance assessment: Needs assistance   Sitting balance-Leahy Scale: Good       Standing balance-Leahy Scale: Poor Standing balance comment: less reliance on UEs but still needing UE support for balance                    Cognition Arousal/Alertness: Awake/alert Behavior During  Therapy: WFL for tasks assessed/performed Overall Cognitive Status: Within Functional Limits for tasks assessed                      Exercises      General Comments        Pertinent Vitals/Pain Pain Assessment: 0-10 Pain Score: 4  Pain Location: Bilateral knees Pain Descriptors / Indicators: Sore Pain Intervention(s): Monitored during session;Limited activity within patient's tolerance;Premedicated before session;Repositioned;Ice applied    Home Living                      Prior Function            PT Goals (current goals can now be found in the care plan section) Acute Rehab PT Goals Patient Stated Goal: to do well enough to go home! PT Goal Formulation: With patient Time For Goal Achievement: 04/14/16 Potential to Achieve Goals: Good Progress towards PT goals: Progressing toward goals    Frequency    7X/week      PT Plan Current plan remains appropriate    Co-evaluation             End of Session Equipment Utilized During Treatment: Gait belt Activity Tolerance: Patient tolerated treatment well Patient left: in chair;with call bell/phone within reach;with chair alarm set     Time: [id number] PT Time Calculation (min) (ACUTE ONLY): 21 min  Charges:  $Gait Training: 8-22 mins  G CodesKenyon Ana 04/10/2016, 11:22 AM

## 2016-04-10 NOTE — Progress Notes (Signed)
Patient ID: Stephanie Fox, female   DOB: 11/11/1950, 65 y.o.   MRN: [id number]   Subjective: 4 Days Post-Op Procedure(s) (LRB): BILATERAL TOTAL KNEE ARTHROPLASTY (Bilateral)    Patient reports pain as moderate.  Progressing fairly well.  Upset with current processes as It pertains to discharge efforts and planning.  There was no pre-operative planning set up with her carrier (WC).  Worried about going home without arrangements made for therapy or other care  Objective:   VITALS:   Vitals:   04/10/16 0604 04/10/16 1324  BP: 110/75 (!) 122/47  Pulse: 100 93  Resp: 16 15  Temp: 98.3 F (36.8 C) 98.1 F (36.7 C)    Neurovascular intact Incision: dressing C/D/I, bilaterally  LABS  Recent Labs  04/08/16 0414  HGB 10.0*  HCT 29.4*  WBC 11.6*  PLT 234     Recent Labs  04/08/16 0414  NA 132*  K 4.5  BUN 17  CREATININE 0.76  GLUCOSE 207*    No results for input(s): LABPT, INR in the last 72 hours.   Assessment/Plan: 4 Days Post-Op Procedure(s) (LRB): BILATERAL TOTAL KNEE ARTHROPLASTY (Bilateral)   Up with therapy  Will work with social work and care coordinators to try and get things arranged for a safe discharge today or tomorrow depending on her insurance carrier.  At this time no plans based on progress with therapy in house for her to be discharged to a SNF or CIR thus discharge to home based on arrangements agreed upon by patient and insurance

## 2016-04-10 NOTE — Progress Notes (Signed)
   04/10/16 1200  PT Visit Information  Last PT Received On 04/10/16  Assistance Needed +1  History of Present Illness s/p bil TKA  Subjective Data  Patient Stated Goal to do well enough to go home!  Precautions  Precautions Knee  Restrictions  Other Position/Activity Restrictions WBAT  Pain Assessment  Pain Assessment 0-10  Pain Score 5  Pain Location Bilateral knees  Pain Descriptors / Indicators Sore  Pain Intervention(s) Monitored during session;Limited activity within patient's tolerance;Repositioned  Cognition  Arousal/Alertness Awake/alert  Behavior During Therapy WFL for tasks assessed/performed  Overall Cognitive Status Within Functional Limits for tasks assessed  Total Joint Exercises  Ankle Circles/Pumps AROM;Both;10 reps  Quad Sets AROM;Both;10 reps  Heel Slides AAROM;Both;10 reps;AROM  Straight Leg Raises AROM;Strengthening;Both;10 reps  Hip ABduction/ADduction AROM;Strengthening;Both;10 reps  Short Arc Quad AROM;Strengthening;Both;10 reps  Goniometric ROM R knee AAROM -10 to 90* flexion; L knee -8 to 90* flexion  Gluteal Sets AROM;Strengthening;Both;10 reps  Knee Flexion Both;AAROM;5 reps;Seated  PT - End of Session  Equipment Utilized During Treatment Gait belt  Activity Tolerance Patient tolerated treatment well  Patient left in chair;with call bell/phone within reach;with nursing/sitter in room  PT - Assessment/Plan  PT Plan Current plan remains appropriate  PT Frequency (ACUTE ONLY) 7X/week  Follow Up Recommendations Home health PT;Supervision for mobility/OOB  PT equipment Rolling walker with 5" wheels;3in1 (PT)  PT Goal Progression  Progress towards PT goals Progressing toward goals  Acute Rehab PT Goals  PT Goal Formulation With patient  Time For Goal Achievement 04/14/16  Potential to Achieve Goals Good  PT Time Calculation  PT Start Time (ACUTE ONLY) 1134  PT Stop Time (ACUTE ONLY) 1158  PT Time Calculation (min) (ACUTE ONLY) 24 min  PT General  Charges  $$ ACUTE PT VISIT 1 Procedure  PT Treatments  $Therapeutic Exercise 23-37 mins

## 2016-04-10 NOTE — Care Management Note (Addendum)
Case Management Note  Patient Details  Name: Stephanie Fox MRN: [id number] Date of Birth: 04/15/1951  Subjective/Objective:                  Bilateral total knees    Action/Plan: Discharge planning Expected Discharge Date: 04/10/16              Expected Discharge Plan:  [place]  In-House Referral:     Discharge planning Services  CM Consult  Post Acute Care Choice:  Home Health Choice offered to:  Patient  DME Arranged:  3-N-1, Walker rolling, Tub bench DME Agency:  Other - Comment  HH Arranged:  PT, OT [place] Agency:  Other - See comment  Status of Service:  In process, will continue to follow  If discussed at Long Length of Stay Meetings, dates discussed:    Additional Comments: 15:40 CM has updated pt as to where we are in the process of arranging home health physical therapy and DME. CM has spoken with PA of Ortho Team to update.  At this point in time CM is waiting to hear from [facility] concerning arrangements- all information has been faxed to [facility] for authorization and negotiation of agency and DME.  13:00 CM received callback from Mechele Claude who states the website is the portal for [facility] authorization; the turnaround time for authorization for the surgery was over one month.  CM Vinnie Level has already gone through the portal and faxed the request for authorization for home health physical therapy and this CM has gone through the portal and has faxed the authorization for DME.  CM will monitor for response.   12:15 CM received callback from [facility], Octavia Bruckner who is unable to accept referral.  CM waiting callback from ortho office [place] rep, Mechele Claude. CM has met with pt in room and spoken with previous CM, Vinnie Level concerning disposition.  Pt will be going home with HHPT/OT and will need 3n1, rolling walker and Tub Transfer bench.  Pt has Re[employer] kit in room.  CM has called  [phone] and there is not voice mail option for callback; [phone] and rep  states they do not do WC (despite option on menu for [facility]) and referred me to, [phone] which rings 20+ times and then converts to a busy signal; CM has called Dr. Aurea Graff office and has requested Mechele Claude who deals with WC claims to please call me back and have not received callback;  CM has called [facility] rep, Tim to please callback in reference to request by previous CM (have not received callback);  CM has called [phone], followed menu to enter subscriber # and recorded message welcomes caller to [place] [employer] WC and then states "Goodbye;" CM has requested [facility] to consider [facility] services and DME through Dr. Aurea Graff office [facility] rep Anderson Malta being able to provide authorization information (as she must have been able to do for authorization for surgery).  CM has kept pt informed and pt states she has no intention of leaving hospital until [facility] services and DME is in place.  CM will continue to reach out to above contacts and MD office to progress this pt home. Stephanie Catholic, RN 04/10/2016, 11:35 AM

## 2016-04-11 LAB — GLUCOSE, CAPILLARY
GLUCOSE-CAPILLARY: 160 mg/dL — AB (ref 65–99)
GLUCOSE-CAPILLARY: 170 mg/dL — AB (ref 65–99)
Glucose-Capillary: 139 mg/dL — ABNORMAL HIGH (ref 65–99)
Glucose-Capillary: 139 mg/dL — ABNORMAL HIGH (ref 65–99)

## 2016-04-11 NOTE — Progress Notes (Signed)
CM received call from [facility] rep, Santiago Glad who states she will accept pt for HHPT.  Pt states she is trying to get "someone" to pick up her DME and prescriptions.  Pt has agreed to purchase out of pocket and agrees to file for reimbursement for 3n1, tub transfer bench and RW.  When pt is discharged home, she will need PTAR to transport home as she cannot get into home with her steps.  CM will continue to follow.

## 2016-04-11 NOTE — Progress Notes (Signed)
Occupational Therapy Treatment Patient Details Name: Stephanie Fox MRN: [id number] DOB: February 09, 1951 Today's Date: 04/11/2016    History of present illness s/p bil TKA   OT comments  Performed ADL in bathroom.  Sit to stand, min guard for safety. Did not need AE  Follow Up Recommendations  Home health OT;Supervision - Intermittent    Equipment Recommendations  3 in 1 bedside commode    Recommendations for Other Services      Precautions / Restrictions Precautions Precautions: Knee Restrictions Other Position/Activity Restrictions: WBAT       Mobility Bed Mobility         Supine to sit: Supervision Sit to supine: Supervision      Transfers   Equipment used: Rolling walker (2 wheeled)   Sit to Stand: Min guard         General transfer comment: slow transition up from bed    Balance                                   ADL       Grooming: Oral care;Supervision/safety;Standing   Upper Body Bathing: Supervision/ safety;Standing   Lower Body Bathing: Min guard;Sit to/from stand       Lower Body Dressing: Min guard;Sit to/from stand   Toilet Transfer: Min guard;Ambulation;BSC;RW             General ADL Comments: min guard for sit to stand during ADLs.  Pt did not need AE; donned socks from bed      Vision                     Perception     Praxis      Cognition   Behavior During Therapy: [facility] for tasks assessed/performed Overall Cognitive Status: Within Functional Limits for tasks assessed                       Extremity/Trunk Assessment               Exercises     Shoulder Instructions       General Comments      Pertinent Vitals/ Pain       Pain Score: 5  Pain Location: bil knee Pain Descriptors / Indicators: Sore Pain Intervention(s): Limited activity within patient's tolerance;Monitored during session;Premedicated before session;Repositioned  Home Living                                          Prior Functioning/Environment              Frequency           Progress Toward Goals  OT Goals(current goals can now be found in the care plan section)  Progress towards OT goals: Progressing toward goals  Acute Rehab OT Goals Patient Stated Goal: to do well enough to go home!  Plan      Co-evaluation                 End of Session     Activity Tolerance Patient limited by fatigue   Patient Left in bed;with call bell/phone within reach   Nurse Communication          Time: [id number] OT Time Calculation (min): 31 min  Charges: OT General Charges $OT Visit: 1  Procedure OT Treatments $Self Care/Home Management : 23-37 mins  Stephanie Fox 04/11/2016, 9:58 AM  [name], OTR/L [phone] 04/11/2016

## 2016-04-11 NOTE — Progress Notes (Signed)
Physical Therapy Treatment Patient Details Name: Stephanie Fox MRN: [id number] DOB: [birth year] Today's Date: 04/11/2016    History of Present Illness s/p bil TKA    PT Comments    POD # 5 am session attempted stair training however pt was unable to even complete one step due to increased pain, weakness and inability to push self up.    Follow Up Recommendations  Home health PT;Supervision for mobility/OOB     Equipment Recommendations  Rolling walker with 5" wheels;3in1 (PT)    Recommendations for Other Services       Precautions / Restrictions Precautions Precautions: Knee Restrictions Weight Bearing Restrictions: No Other Position/Activity Restrictions: WBAT    Mobility  Bed Mobility Overal bed mobility: Needs Assistance Bed Mobility: Supine to Sit;Sit to Supine     Supine to sit: Min guard Sit to supine: Min assist   General bed mobility comments: increased assist back to bed due to increased c/o pain from attempting stairs.   Transfers Overall transfer level: Needs assistance Equipment used: Rolling walker (2 wheeled) Transfers: Sit to/from Stand Sit to Stand: Min guard;Min assist Stand pivot transfers: Min guard       General transfer comment: slow transition up from elevated bed and required increased assist from lower recliner level.    Ambulation/Gait Ambulation/Gait assistance: Min guard Ambulation Distance (Feet): 84 Feet Assistive device: Rolling walker (2 wheeled) Gait Pattern/deviations: Step-to pattern;Decreased step length - left;Decreased stance time - right;Wide base of support;Trunk flexed Gait velocity: decr   General Gait Details: occasional cues for RW position and safety   Stairs Stairs: Yes   Stair Management: No rails;Step to pattern;Backwards;With walker Number of Stairs: 1 General stair comments: pt has 3 steps no rail to eneter her home.  attempted to perform however pt was unable to even perform one step successfully.  Great  difficulty stepping up with L LE(stronger) and having R LE support her.  Increased pain and poor upper body strength inhibit.    Wheelchair Mobility    Modified Rankin (Stroke Patients Only)       Balance                                    Cognition Arousal/Alertness: Awake/alert Behavior During Therapy: WFL for tasks assessed/performed Overall Cognitive Status: Within Functional Limits for tasks assessed                      Exercises      General Comments        Pertinent Vitals/Pain Pain Score: 5  Pain Location: bil knee Pain Descriptors / Indicators: Sore Pain Intervention(s): Limited activity within patient's tolerance;Monitored during session;Premedicated before session;Repositioned    Home Living                      Prior Function            PT Goals (current goals can now be found in the care plan section) Acute Rehab PT Goals Patient Stated Goal: to do well enough to go home! Progress towards PT goals: Progressing toward goals    Frequency           PT Plan Current plan remains appropriate    Co-evaluation             End of Session Equipment Utilized During Treatment: Gait belt Activity Tolerance: Patient limited by pain;Patient limited by fatigue  Patient left: in bed;with call bell/phone within reach     Time: 1020-1050 PT Time Calculation (min) (ACUTE ONLY): 30 min  Charges:  $Gait Training: 8-22 mins $Therapeutic Activity: 8-22 mins                    G Codes:      Rica Koyanagi  PTA [facility]  Acute  Rehab Pager      [phone]

## 2016-04-11 NOTE — Progress Notes (Signed)
Physical Therapy Treatment Patient Details Name: Stephanie Fox MRN: [id number] DOB: [birth year] Today's Date: 04/11/2016    History of Present Illness s/p bil TKA    PT Comments    POD # 5 Assisted OOB to amb to bathroom then in hallway.  Greatest difficulty rising from lower surface level.    Follow Up Recommendations  Home health PT;Supervision for mobility/OOB  may need to D/C to home via [facility] Unable to perfom even one step and has 4 to get into her home   Equipment Recommendations  Rolling walker with 5" wheels;3in1 (PT)    Recommendations for Other Services       Precautions / Restrictions Precautions Precautions: Knee Restrictions Weight Bearing Restrictions: No Other Position/Activity Restrictions: WBAT    Mobility  Bed Mobility Overal bed mobility: Needs Assistance Bed Mobility: Supine to Sit;Sit to Supine     Supine to sit: Min guard Sit to supine: Min guard;Min assist   General bed mobility comments: increased assist back to bed  Transfers Overall transfer level: Needs assistance Equipment used: Rolling walker (2 wheeled) Transfers: Sit to/from Stand Sit to Stand: Min guard;Min assist Stand pivot transfers: Min guard       General transfer comment: slow transition up from elevated bed and required increased assist from lower recliner level.    Ambulation/Gait Ambulation/Gait assistance: Min guard;Supervision Ambulation Distance (Feet): 120 Feet Assistive device: Rolling walker (2 wheeled) Gait Pattern/deviations: Step-to pattern;Decreased step length - left;Decreased stance time - right;Wide base of support;Trunk flexed Gait velocity: decr   General Gait Details: occasional cues for RW position and safety   Stairs Stairs: Yes   Stair Management: No rails;Step to pattern;Backwards;With walker Number of Stairs: 1 General stair comments: pt has 3 steps no rail to eneter her home.  attempted to perform however pt was unable to even perform one  step successfully.  Great difficulty stepping up with L LE(stronger) and having R LE support her.  Increased pain and poor upper body strength inhibit.    Wheelchair Mobility    Modified Rankin (Stroke Patients Only)       Balance                                    Cognition Arousal/Alertness: Awake/alert Behavior During Therapy: WFL for tasks assessed/performed Overall Cognitive Status: Within Functional Limits for tasks assessed                      Exercises   Total Knee Replacement TE's 10 reps B LE ankle pumps 10 reps towel squeezes 10 reps knee presses 10 reps heel slides  10 reps SAQ's 10 reps SLR's 10 reps ABD Followed by ICE     General Comments        Pertinent Vitals/Pain Pain Assessment: 0-10 Pain Score: 5  Pain Location: bil knee Pain Descriptors / Indicators: Operative site guarding Pain Intervention(s): Monitored during session;Repositioned;Ice applied    Home Living                      Prior Function            PT Goals (current goals can now be found in the care plan section) Progress towards PT goals: Progressing toward goals    Frequency    7X/week      PT Plan Current plan remains appropriate    Co-evaluation  End of Session Equipment Utilized During Treatment: Gait belt Activity Tolerance: Patient limited by pain;Patient limited by fatigue Patient left: in bed;with call bell/phone within reach     Time: 1330-1410 PT Time Calculation (min) (ACUTE ONLY): 40 min  Charges:  $Gait Training: 8-22 mins $Therapeutic Exercise: 8-22 mins $Therapeutic Activity: 8-22 mins                    G Codes:      Rica Koyanagi  PTA [facility]  Acute  Rehab Pager      [phone]

## 2016-04-11 NOTE — Progress Notes (Signed)
     Subjective: 5 Days Post-Op Procedure(s) (LRB): BILATERAL TOTAL KNEE ARTHROPLASTY (Bilateral)   Patient reports pain as mild, pain controlled. No events throughout the night.  Working on discharge disposition, equipment and resources.    Objective:   VITALS:   Vitals:   04/10/16 2151 04/11/16 0534  BP: (!) 120/55 (!) 125/56  Pulse: (!) 102 99  Resp: 16 16  Temp: 98.2 F (36.8 C) 98.8 F (37.1 C)   Bilaterally: Dorsiflexion/Plantar flexion intact Incision: dressing C/D/I No cellulitis present Compartment soft  LABS No results for input(s): HGB, HCT, WBC, PLT in the last 72 hours.  No results for input(s): NA, K, BUN, CREATININE, GLUCOSE in the last 72 hours.   Assessment/Plan: 5 Days Post-Op Procedure(s) (LRB): BILATERAL TOTAL KNEE ARTHROPLASTY (Bilateral) Up with therapy  Discharge disposition to be determined.  Obese (BMI 30-39.9) Estimated body mass index is 39.77 kg/m as calculated from the following:   Height as of this encounter: 5\' 5"  (1.651 m).   Weight as of this encounter: 108.4 kg (239 lb). Patient also counseled that weight may inhibit the healing process Patient counseled that losing weight will help with future health issues      West Pugh. Stephanie Fox   PAC  04/11/2016, 8:41 AM

## 2016-04-12 LAB — GLUCOSE, CAPILLARY
GLUCOSE-CAPILLARY: 130 mg/dL — AB (ref 65–99)
Glucose-Capillary: 144 mg/dL — ABNORMAL HIGH (ref 65–99)
Glucose-Capillary: 147 mg/dL — ABNORMAL HIGH (ref 65–99)

## 2016-04-12 MED ORDER — NICOTINE 14 MG/24HR TD PT24: 14.0000 mg | MEDICATED_PATCH | Freq: Every day | TRANSDERMAL | 0 refills | Status: DC

## 2016-04-12 NOTE — Progress Notes (Signed)
Physical Therapy Treatment Patient Details Name: Stephanie Fox MRN: [id number] DOB: 20-Jun-1950 Today's Date: 04/12/2016    History of Present Illness s/p bil TKA     PT Comments    POD # 6 Pt is able to get in/out of bed on her own.  Increased time but required no physical assist.  Tolerated increased distance and performed all TKR TE's followed by ice.   Follow Up Recommendations  Home health PT;Supervision for mobility/OOB  via [facility]   Equipment Recommendations  Rolling walker with 5" wheels;3in1 (PT)    Recommendations for Other Services       Precautions / Restrictions Precautions Precautions: Knee Restrictions Weight Bearing Restrictions: No Other Position/Activity Restrictions: WBAT    Mobility  Bed Mobility Overal bed mobility: Modified Independent             General bed mobility comments: able to self perform with increased time and bed rail    required NO physical assist  Transfers Overall transfer level: Needs assistance Equipment used: Rolling walker (2 wheeled) Transfers: Sit to/from Stand Sit to Stand: Modified independent (Device/Increase time)         General transfer comment: able to self perform with increased time  Ambulation/Gait Ambulation/Gait assistance: Modified independent (Device/Increase time);Supervision Ambulation Distance (Feet): 275 Feet Assistive device: Rolling walker (2 wheeled) Gait Pattern/deviations: Step-to pattern;Step-through pattern Gait velocity: decr   General Gait Details: tolerated increased distance and good safe gait using her walker   Stairs            Wheelchair Mobility    Modified Rankin (Stroke Patients Only)       Balance                                    Cognition Arousal/Alertness: Awake/alert Behavior During Therapy: WFL for tasks assessed/performed Overall Cognitive Status: Within Functional Limits for tasks assessed                      Exercises    Total Knee Replacement TE's 10 reps B LE ankle pumps 10 reps towel squeezes 10 reps knee presses 10 reps heel slides  10 reps SAQ's 10 reps SLR's 10 reps ABD Followed by ICE     General Comments        Pertinent Vitals/Pain Pain Assessment: Faces Faces Pain Scale: Hurts a little bit Pain Location: Bil Knee R>L Pain Descriptors / Indicators: Operative site guarding;Tender Pain Intervention(s): Monitored during session;Repositioned;Ice applied    Home Living                      Prior Function            PT Goals (current goals can now be found in the care plan section) Progress towards PT goals: Progressing toward goals    Frequency    7X/week      PT Plan Current plan remains appropriate    Co-evaluation             End of Session Equipment Utilized During Treatment: Gait belt Activity Tolerance: Patient tolerated treatment well Patient left: in bed;with call bell/phone within reach     Time: 1415-1500 PT Time Calculation (min) (ACUTE ONLY): 45 min  Charges:  $Gait Training: 8-22 mins $Therapeutic Exercise: 8-22 mins $Therapeutic Activity: 8-22 mins  G Codes:      Rica Koyanagi  PTA [facility]  Acute  Rehab Pager      [phone]

## 2016-04-12 NOTE — Progress Notes (Signed)
     Subjective: 6 Days Post-Op Procedure(s) (LRB): BILATERAL TOTAL KNEE ARTHROPLASTY (Bilateral)   Patient reports pain as mild, pain controlled. No events throughout the night.  [facility] states that they will work with the patient with regards to [facility].  Discussion had with the patient states that she isn't able to get up on her own.  We will allow her to work with PT anticipating that she will do well and be able to discharge home today.  Objective:   VITALS:   Vitals:   04/11/16 2102 04/12/16 0626  BP: (!) 102/55 122/64  Pulse: 80 97  Resp: 18 18  Temp: 98.7 F (37.1 C) 98.9 F (37.2 C)    Bilaterally: Dorsiflexion/Plantar flexion intact Incision: dressing C/D/I No cellulitis present Compartment soft  LABS No new labs    Assessment/Plan: 6 Days Post-Op Procedure(s) (LRB): BILATERAL TOTAL KNEE ARTHROPLASTY (Bilateral) Up with therapy Discharge home with home health  Follow up in 2 weeks at [facility]. Follow up with OLIN,Amani Nodarse D in 2 weeks.  Contact information:  [facility] [address] [place] [id number]    Obese (BMI 30-39.9) Estimated body mass index is 39.77 kg/m as calculated from the following:   Height as of this encounter: 5\' 5"  (1.651 m).   Weight as of this encounter: 108.4 kg (239 lb). Patient also counseled that weight may inhibit the healing process Patient counseled that losing weight will help with future health issues        West Pugh. Latravion Graves   PAC  04/12/2016, 7:46 AM

## 2016-04-12 NOTE — Progress Notes (Signed)
Occupational Therapy Treatment Patient Details Name: Stephanie Stephanie MRN: [id number] DOB: [birth year] Today's Date: 04/12/2016    History of present illness s/p bil TKA   OT comments  Tolerated session well.  Recommend HHOT for IADLS; ensure tub bench is set up correctly.  Follow Up Recommendations  Home health OT;Supervision - Intermittent    Equipment Recommendations  3 in 1 bedside commode;Tub/shower bench    Recommendations for Other Services      Precautions / Restrictions Precautions Precautions: Knee Restrictions Weight Bearing Restrictions: No Other Position/Activity Restrictions: WBAT       Mobility Bed Mobility         Supine to sit: Modified independent (Device/Increase time) Sit to supine: Modified independent (Device/Increase time)      Transfers   Equipment used: Rolling walker (2 wheeled)   Sit to Stand: Supervision;Min guard         General transfer comment: min guard from tub bench (our model does not have suction cup feet).  Supervision from bed    Balance                                   ADL                                     Tub/Shower Transfer Details (indicate cue type and reason): supervision, tub bench, simulated tub   General ADL Comments: performed tub bench transfer and got up from flat bed without rail.  Recommended pt set tub bench as high as possible allowing feet to be supported on floor.  Pt's concern is getting to food if needed as she is diabetic.  Encouraged her to have someone get easy to access snacks and juice boxes that she can access quickly.  She is concerned about stairs; HHPT will continue to work with her on these.        Vision                     Perception     Praxis      Cognition   Behavior During Therapy: WFL for tasks assessed/performed Overall Cognitive Status: Within Functional Limits for tasks assessed                       Extremity/Trunk  Assessment               Exercises     Shoulder Instructions       General Comments      Pertinent Vitals/ Pain       Pain Score: 4  Pain Location: bil knee Pain Descriptors / Indicators: Sore Pain Intervention(s): Limited activity within patient's tolerance;Monitored during session;Premedicated before session;Repositioned  Home Living                                          Prior Functioning/Environment              Frequency           Progress Toward Goals  OT Goals(current goals can now be found in the care plan section)  Progress towards OT goals: Goals met/education completed, patient discharged from [facility]  Co-evaluation                 End of Session     Activity Tolerance Patient tolerated treatment well   Patient Left in bed;with call bell/phone within reach   Nurse Communication          Time: 0086-7619 OT Time Calculation (min): 33 min  Charges: OT General Charges $OT Visit: 1 Procedure OT Treatments $Self Care/Home Management : 23-37 mins  Stephanie Stephanie 04/12/2016, 11:12 AM  Stephanie Stephanie, OTR/L [phone] 04/12/2016

## 2016-04-12 NOTE — Progress Notes (Signed)
CM has completed the Medical Transport Form and facesheet for [facility].  Pt has been discharged.  Once pt has arranged for pick up of her DME (in the room), RN can call [facility] for transport home.

## 2016-04-17 NOTE — Discharge Summary (Signed)
Physician Discharge Summary  Patient ID: Stephanie Fox MRN: [id number] DOB/AGE: Mar 10, 1951 65 y.o.  Admit date: 04/06/2016 Discharge date: 04/12/2016   Procedures:  Procedure(s) (LRB): BILATERAL TOTAL KNEE ARTHROPLASTY (Bilateral)  Attending Physician:  Dr. Paralee Cancel   Admission Diagnoses:   Bilateral knee primary OA / pain  Discharge Diagnoses:  Principal Problem:   S/P bilateral TKAs Active Problems:   S/P knee replacement  Past Medical History:  Diagnosis Date  . Anemia    as a child   . Arthritis   . Diabetes mellitus ([place])    type II   . Dyspnea    with exertion   . Edema    lower extremities   . GERD (gastroesophageal reflux disease)   . History of kidney stones   . Hyperlipidemia   . Hypertension     HPI:    Stephanie Fox, 65 y.o. female, has a history of pain and functional disability in the bilateral knee due to arthritis and has failed non-surgical conservative treatments for greater than 12 weeks to include NSAID's and/or analgesics, corticosteriod injections, use of assistive devices and activity modification.  Onset of symptoms was gradual, starting >10 years ago with gradually worsening course since that time. The patient noted prior procedures on the knee to include  menisectomy on the right knee(s).  Patient currently rates pain in the bilateral knee(s) at 8 out of 10 with activity. Patient has night pain, worsening of pain with activity and weight bearing, pain that interferes with activities of daily living, pain with passive range of motion, crepitus and joint swelling.  Patient has evidence of periarticular osteophytes and joint space narrowing by imaging studies. There is no active infection.   Risks, benefits and expectations were discussed with the patient.  Risks including but not limited to the risk of anesthesia, blood clots, nerve damage, blood vessel damage, failure of the prosthesis, infection and up to and including death.  Patient understand the  risks, benefits and expectations and wishes to proceed with surgery.   PCP: [name], MD   Discharged Condition: good  Hospital Course:  Patient underwent the above stated procedure on 04/06/2016. Patient tolerated the procedure well and brought to the recovery room in good condition and subsequently to the floor.  POD #1 BP: 125/67 ; Pulse: 67 ; Temp: 97.8 F (36.6 C) ; Resp: 15 Pt doing well this morning, pain is mild currently.  Denies any new symptoms or issues.  Ready for therapy today. Bilateral knee dressings intact.  NV intact distally.  No rashes or edema distally  LABS  Basename    HGB     11.2  HCT     32.9   POD #2  BP: 116/69 ; Pulse: 86 ; Temp: 98.5 F (36.9 C) ; Resp: 16 Patient reports pain as moderate, challenged more with her right knee than left.  No events at this point. Neurovascular intact and incision: dressing C/D/I, bilateral knee.  LABS  Basename    HGB     10.0  HCT     29.4   POD #3  BP: 97/52 ; Pulse: 88 ; Temp: 98.3 F (36.8 C) ; Resp: 16 Patient reports pain as mild.  Progressing well.  Upset with current processes as It pertains to discharge efforts.  Some nausea present. Bilaterally: Dorsiflexion/plantar flexion intact, incision: dressing C/D/I, no cellulitis present and compartment soft.   LABS   No new labs   POD #4  BP: 122/47 ; Pulse: 93 ;  Temp: 98.1 F (36.7 C) ; Resp: 15 Patient reports pain as moderate.  Progressing fairly well.  Upset with current processes as It pertains to discharge efforts and planning.  There was no pre-operative planning set up with her carrier ([facility]).  Worried about going home without arrangements made for therapy or other care. Neurovascular intact and incision: dressing C/D/I, bilaterally.  LABS   No new labs  POD #5  BP: 125/56 ; Pulse: 99 ; Temp: 98.8 F (37.1 C) ; Resp: 16 Patient reports pain as mild, pain controlled. No events throughout the night.  Working on discharge disposition, equipment  and resources.   Bilaterally: Dorsiflexion/plantar flexion intact, incision: dressing C/D/I, no cellulitis present and compartment soft.   LABS   No new labs  POD #6  BP: 122/64 ; Pulse: 97 ; Temp: 98.9 F (37.2 C) ; Resp: 18 Patient reports pain as mild, pain controlled. No events throughout the night.  [facility] states that they will work with the patient with regards to [place].  Discussion had with the patient states that she isn't able to get up on her own. We will allow her to work with PT anticipating that she will do well and be able to discharge home today. Bilaterally: Dorsiflexion/plantar flexion intact, incision: dressing C/D/I, no cellulitis present and compartment soft.   LABS   No new labs   Discharge Exam: General appearance: alert, cooperative and no distress Extremities: Homans sign is negative, no sign of DVT, no edema, redness or tenderness in the calves or thighs and no ulcers, gangrene or trophic changes  Disposition: Home with follow up in 2 weeks   Follow-up Information    Mauri Pole, MD. Schedule an appointment as soon as possible for a visit in 2 week(s).   Specialty:  Orthopedic Surgery Contact information: [address] [place] [address] [phone]        [facility] Follow up.   Contact information: [address] [place] [address] [phone]           Discharge Instructions    Call MD / Call 911    Complete by:  As directed    If you experience chest pain or shortness of breath, CALL 911 and be transported to the hospital emergency room.  If you develope a fever above 101 F, pus (white drainage) or increased drainage or redness at the wound, or calf pain, call your surgeon's office.   Change dressing    Complete by:  As directed    Maintain surgical dressing until follow up in the clinic. If the edges start to pull up, may reinforce with tape. If the dressing is no longer working, may  remove and cover with gauze and tape, but must keep the area dry and clean.  Call with any questions or concerns.   Constipation Prevention    Complete by:  As directed    Drink plenty of fluids.  Prune juice may be helpful.  You may use a stool softener, such as Colace (over the counter) 100 mg twice a day.  Use MiraLax (over the counter) for constipation as needed.   Diet - low sodium heart healthy    Complete by:  As directed    Discharge instructions    Complete by:  As directed    Maintain surgical dressing until follow up in the clinic. If the edges start to pull up, may reinforce with tape. If the dressing is no longer working,  may remove and cover with gauze and tape, but must keep the area dry and clean.  Follow up in 2 weeks at [facility]. Call with any questions or concerns.   Increase activity slowly as tolerated    Complete by:  As directed    Weight bearing as tolerated with assist device (walker, cane, etc) as directed, use it as long as suggested by your surgeon or therapist, typically at least 4-6 weeks.   TED hose    Complete by:  As directed    Use stockings (TED hose) for 2 weeks on both leg(s).  You may remove them at night for sleeping.      Allergies as of 04/12/2016   No Known Allergies     Medication List    TAKE these medications   acetaminophen 500 MG tablet Commonly known as:  TYLENOL Take 2 tablets (1,000 mg total) by mouth every 8 (eight) hours.   aspirin EC 81 MG tablet Take 1 tablet (81 mg total) by mouth 2 (two) times daily. Start the day after finishing the Xarelto.  Take for 4 weeks, then resume regular dose. Start taking on:  04/25/2016 What changed:  when to take this  additional instructions   benzonatate 100 MG capsule Commonly known as:  TESSALON Take 100 mg by mouth 2 (two) times daily as needed for cough.   calcium carbonate 500 MG chewable tablet Commonly known as:  TUMS - dosed in mg elemental calcium Chew 3 tablets by  mouth daily as needed for indigestion or heartburn.   docusate sodium 100 MG capsule Commonly known as:  COLACE Take 1 capsule (100 mg total) by mouth 2 (two) times daily.   ferrous sulfate 325 (65 FE) MG tablet Take 1 tablet (325 mg total) by mouth 3 (three) times daily after meals.   Fish Oil 1000 MG Caps Take 2 capsules by mouth daily.   furosemide 40 MG tablet Commonly known as:  LASIX Take 40 mg by mouth daily as needed for fluid.   metFORMIN 500 MG tablet Commonly known as:  GLUCOPHAGE Take 500 mg by mouth 2 (two) times daily with a meal.   methocarbamol 500 MG tablet Commonly known as:  ROBAXIN Take 1 tablet (500 mg total) by mouth every 6 (six) hours as needed for muscle spasms.   multivitamin tablet Take 1 tablet by mouth daily. Reported on 07/21/2015   nicotine 14 mg/24hr patch Commonly known as:  NICODERM CQ - dosed in mg/24 hours Place 1 patch (14 mg total) onto the skin daily.   ondansetron 4 MG tablet Commonly known as:  ZOFRAN Take 1 tablet (4 mg total) by mouth every 6 (six) hours as needed for nausea.   oxyCODONE 5 MG immediate release tablet Commonly known as:  Oxy IR/ROXICODONE Take 1-3 tablets (5-15 mg total) by mouth every 4 (four) hours as needed for severe pain.   polyethylene glycol packet Commonly known as:  MIRALAX / GLYCOLAX Take 17 g by mouth 2 (two) times daily.   rivaroxaban 10 MG Tabs tablet Commonly known as:  XARELTO Take 1 tablet (10 mg total) by mouth daily. Take for 14 days, then start aspirin.   valsartan-hydrochlorothiazide 80-12.5 MG tablet Commonly known as:  DIOVAN-HCT Take 1 tablet by mouth daily.   VICTOZA 18 MG/3ML Sopn Generic drug:  liraglutide Inject 0.6 mg into the skin daily.   Vitamin D3 2000 units Tabs Take 2,000 Units by mouth daily.        Signed:  [place] P[place] Latravious Levitt   PA-C  04/17/2016, 2:34 PM

## 2016-04-19 ENCOUNTER — Telehealth: Payer: Self-pay | Admitting: *Deleted

## 2016-04-19 NOTE — Telephone Encounter (Signed)
CM received call from [place], [place] with [facility] as she received call from Clarene Essex [phone] Case Worker for pt's Workers Comp stating it is the responsibility of this CM to arrange for transportation to MD office for follow up.  CM called Denyse Amass and explained our responsibility for transportation is to home or SNF upon discharge; Denyse Amass responded it is not his problem.  CM called pt's home number and without any identifiers per HIPAA, left pt a number for [place] [phone] and left pt my callback number if she needed to speak to me.  No other CM needs were communicated.

## 2016-06-05 ENCOUNTER — Other Ambulatory Visit: Payer: Self-pay | Admitting: Obstetrics

## 2016-06-05 DIAGNOSIS — Z1231 Encounter for screening mammogram for malignant neoplasm of breast: Secondary | ICD-10-CM

## 2016-06-28 ENCOUNTER — Ambulatory Visit: Payer: Self-pay

## 2016-07-11 ENCOUNTER — Ambulatory Visit
Admission: RE | Admit: 2016-07-11 | Discharge: 2016-07-11 | Disposition: A | Discharge status: Home / Self Care | Payer: No Typology Code available for payment source | Source: Ambulatory Visit | Attending: Obstetrics | Admitting: Obstetrics

## 2016-07-11 DIAGNOSIS — Z1231 Encounter for screening mammogram for malignant neoplasm of breast: Secondary | ICD-10-CM

## 2016-07-24 ENCOUNTER — Ambulatory Visit (INDEPENDENT_AMBULATORY_CARE_PROVIDER_SITE_OTHER): Payer: No Typology Code available for payment source | Admitting: Obstetrics

## 2016-07-24 ENCOUNTER — Other Ambulatory Visit: Payer: Self-pay | Admitting: Obstetrics

## 2016-07-24 ENCOUNTER — Encounter: Payer: Self-pay | Admitting: Obstetrics

## 2016-07-24 DIAGNOSIS — Z01419 Encounter for gynecological examination (general) (routine) without abnormal findings: Secondary | ICD-10-CM

## 2016-07-24 DIAGNOSIS — Z780 Asymptomatic menopausal state: Secondary | ICD-10-CM

## 2016-07-24 NOTE — Progress Notes (Signed)
Subjective:        Stephanie Fox is a 66 y.o. female here for a routine exam.  Current complaints: None.    Personal health questionnaire:  Is patient [name], have a family history of breast and/or ovarian cancer: no Is there a family history of uterine cancer diagnosed at age < 27, gastrointestinal cancer, urinary tract cancer, family member who is a Field seismologist syndrome-associated carrier: no Is the patient overweight and hypertensive, family history of diabetes, personal history of gestational diabetes, preeclampsia or PCOS: no Is patient over 5, have PCOS,  family history of premature CHD under age 45, diabetes, smoke, have hypertension or peripheral artery disease:  no At any time, has a partner hit, kicked or otherwise hurt or frightened you?: no Over the past 2 weeks, have you felt down, depressed or hopeless?: no Over the past 2 weeks, have you felt little interest or pleasure in doing things?:no   Gynecologic History No LMP recorded. Patient is postmenopausal. Contraception: post menopausal status Last Pap: 2017. Results were: normal Last mammogram: 2018. Results were: normal  Obstetric History OB History  Gravida Para Term Preterm AB Living  2 0 0 0 2 0  SAB TAB Ectopic Multiple Live Births  0 2 0 0      # Outcome Date GA Lbr Len/2nd Weight Sex Delivery Anes PTL Lv  2 TAB           1 TAB               Past Medical History:  Diagnosis Date  . Anemia    as a child   . Arthritis   . Diabetes mellitus ([place])    type II   . Dyspnea    with exertion   . Edema    lower extremities   . GERD (gastroesophageal reflux disease)   . History of kidney stones   . Hyperlipidemia   . Hypertension     Past Surgical History:  Procedure Laterality Date  . BREAST REDUCTION SURGERY    . KNEE SURGERY    . thumb surgery    . 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   Procedure: BILATERAL TOTAL KNEE ARTHROPLASTY;  Surgeon: Paralee Cancel, MD;  Location: [facility];   Service: Orthopedics;  Laterality: Bilateral;  Adductor Block  . underarm surgery       Current Outpatient Prescriptions:  .  acetaminophen (TYLENOL) 500 MG tablet, Take 2 tablets (1,000 mg total) by mouth every 8 (eight) hours., Disp: 30 tablet, Rfl: 0 .  aspirin 81 MG chewable tablet, Chew by mouth daily., Disp: , Rfl:  .  benzonatate (TESSALON) 100 MG capsule, Take 100 mg by mouth 2 (two) times daily as needed for cough. , Disp: , Rfl:  .  Cholecalciferol (VITAMIN D3) 2000 UNITS TABS, Take 2,000 Units by mouth daily. , Disp: , Rfl:  .  furosemide (LASIX) 40 MG tablet, Take 40 mg by mouth daily as needed for fluid. , Disp: , Rfl:  .  Liraglutide (VICTOZA) 18 MG/3ML SOPN, Inject 0.6 mg into the skin daily. , Disp: , Rfl:  .  metFORMIN (GLUCOPHAGE) 500 MG tablet, Take 500 mg by mouth 2 (two) times daily with a meal., Disp: , Rfl:  .  methocarbamol (ROBAXIN) 500 MG tablet, Take 1 tablet (500 mg total) by mouth every 6 (six) hours as needed for muscle spasms., Disp: 40 tablet, Rfl: 0 .  Multiple Vitamin (MULTIVITAMIN) tablet, Take 1 tablet by mouth daily. Reported on  07/21/2015, Disp: , Rfl:  .  nicotine (NICODERM CQ - DOSED IN MG/24 HOURS) 14 mg/24hr patch, Place 1 patch (14 mg total) onto the skin daily., Disp: 28 patch, Rfl: 0 .  Omega-3 Fatty Acids (FISH OIL) 1000 MG CAPS, Take 2 capsules by mouth daily. , Disp: , Rfl:  .  valsartan-hydrochlorothiazide (DIOVAN-HCT) 80-12.5 MG per tablet, Take 1 tablet by mouth daily., Disp: , Rfl:  .  calcium carbonate (TUMS - DOSED IN MG ELEMENTAL CALCIUM) 500 MG chewable tablet, Chew 3 tablets by mouth daily as needed for indigestion or heartburn., Disp: , Rfl:  .  docusate sodium (COLACE) 100 MG capsule, Take 1 capsule (100 mg total) by mouth 2 (two) times daily. (Patient not taking: Reported on 07/24/2016), Disp: 10 capsule, Rfl: 0 .  ferrous sulfate 325 (65 FE) MG tablet, Take 1 tablet (325 mg total) by mouth 3 (three) times daily after meals. (Patient not  taking: Reported on 07/24/2016), Disp: , Rfl: 3 .  ondansetron (ZOFRAN) 4 MG tablet, Take 1 tablet (4 mg total) by mouth every 6 (six) hours as needed for nausea. (Patient not taking: Reported on 07/24/2016), Disp: 30 tablet, Rfl: 0 .  oxyCODONE (OXY IR/ROXICODONE) 5 MG immediate release tablet, Take 1-3 tablets (5-15 mg total) by mouth every 4 (four) hours as needed for severe pain. (Patient not taking: Reported on 07/24/2016), Disp: 90 tablet, Rfl: 0 .  polyethylene glycol (MIRALAX / GLYCOLAX) packet, Take 17 g by mouth 2 (two) times daily. (Patient not taking: Reported on 07/24/2016), Disp: 14 each, Rfl: 0 .  rivaroxaban (XARELTO) 10 MG TABS tablet, Take 1 tablet (10 mg total) by mouth daily. Take for 14 days, then start aspirin., Disp: 14 tablet, Rfl: 0 No Known Allergies  Social History  Substance Use Topics  . Smoking status: Current Every Day Smoker    Packs/day: 0.50    Types: Cigarettes  . Smokeless tobacco: Never Used  . Alcohol use No    Family History  Problem Relation Age of Onset  . Colon cancer Neg Hx       Review of Systems  Constitutional: negative for fatigue and weight loss Respiratory: negative for cough and wheezing Cardiovascular: negative for chest pain, fatigue and palpitations Gastrointestinal: negative for abdominal pain and change in bowel habits Musculoskeletal:negative for myalgias Neurological: negative for gait problems and tremors Behavioral/Psych: negative for abusive relationship, depression Endocrine: negative for temperature intolerance    Genitourinary:negative for abnormal menstrual periods, genital lesions, hot flashes, sexual problems and vaginal discharge Integument/breast: negative for breast lump, breast tenderness, nipple discharge and skin lesion(s)    Objective:       BP 128/79   Pulse (!) 113   Temp 99.1 F (37.3 C)   Wt 246 lb (111.6 kg)   BMI 40.94 kg/m  General:   alert  Skin:   no rash or abnormalities  Lungs:   clear to  auscultation bilaterally  Heart:   regular rate and rhythm, S1, S2 normal, no murmur, click, rub or gallop  Breasts:   normal without suspicious masses, skin or nipple changes or axillary nodes  Abdomen:  normal findings: no organomegaly, soft, non-tender and no hernia  Pelvis:  External genitalia: normal general appearance Urinary system: urethral meatus normal and bladder without fullness, nontender Vaginal: normal without tenderness, induration or masses Cervix: normal appearance Adnexa: normal bimanual exam Uterus: anteverted and non-tender, normal size   Lab Review Urine pregnancy test Labs reviewed yes Radiologic studies reviewed yes  50%  of 20 min visit spent on counseling and coordination of care.    Assessment:    Healthy female exam.    Postmenopausal.  Doing well.   Plan:    Education reviewed: calcium supplements, depression evaluation, low fat, low cholesterol diet, self breast exams, smoking cessation and weight bearing exercise. Follow up in: 1 year.   Meds ordered this encounter  Medications  . aspirin 81 MG chewable tablet    Sig: Chew by mouth daily.   Orders Placed This Encounter  Procedures  . GC/Chlamydia Probe Amp     Patient ID: Stephanie Fox, female   DOB: [birth year], 81 y.o.   MRN: [id number]

## 2016-07-24 NOTE — Patient Instructions (Addendum)
Health Maintenance for Postmenopausal Women Menopause is a normal process in which your reproductive ability comes to an end. This process happens gradually over a span of months to years, usually between the ages of 33 and 38. Menopause is complete when you have missed 12 consecutive menstrual periods. It is important to talk with your health care provider about some of the most common conditions that affect postmenopausal women, such as heart disease, cancer, and bone loss (osteoporosis). Adopting a healthy lifestyle and getting preventive care can help to promote your health and wellness. Those actions can also lower your chances of developing some of these common conditions. What should I know about menopause? During menopause, you may experience a number of symptoms, such as:  Moderate-to-severe hot flashes.  Night sweats.  Decrease in sex drive.  Mood swings.  Headaches.  Tiredness.  Irritability.  Memory problems.  Insomnia. Choosing to treat or not to treat menopausal changes is an individual decision that you make with your health care provider. What should I know about hormone replacement therapy and supplements? Hormone therapy products are effective for treating symptoms that are associated with menopause, such as hot flashes and night sweats. Hormone replacement carries certain risks, especially as you become older. If you are thinking about using estrogen or estrogen with progestin treatments, discuss the benefits and risks with your health care provider. What should I know about heart disease and stroke? Heart disease, heart attack, and stroke become more likely as you age. This may be due, in part, to the hormonal changes that your body experiences during menopause. These can affect how your body processes dietary fats, triglycerides, and cholesterol. Heart attack and stroke are both medical emergencies. There are many things that you can do to help prevent heart disease  and stroke:  Have your blood pressure checked at least every 1-2 years. High blood pressure causes heart disease and increases the risk of stroke.  If you are 48-61 years old, ask your health care provider if you should take aspirin to prevent a heart attack or a stroke.  Do not use any tobacco products, including cigarettes, chewing tobacco, or electronic cigarettes. If you need help quitting, ask your health care provider.  It is important to eat a healthy diet and maintain a healthy weight.  Be sure to include plenty of vegetables, fruits, low-fat dairy products, and lean protein.  Avoid eating foods that are high in solid fats, added sugars, or salt (sodium).  Get regular exercise. This is one of the most important things that you can do for your health.  Try to exercise for at least 150 minutes each week. The type of exercise that you do should increase your heart rate and make you sweat. This is known as moderate-intensity exercise.  Try to do strengthening exercises at least twice each week. Do these in addition to the moderate-intensity exercise.  Know your numbers.Ask your health care provider to check your cholesterol and your blood glucose. Continue to have your blood tested as directed by your health care provider. What should I know about cancer screening? There are several types of cancer. Take the following steps to reduce your risk and to catch any cancer development as early as possible. Breast Cancer  Practice breast self-awareness.  This means understanding how your breasts normally appear and feel.  It also means doing regular breast self-exams. Let your health care provider know about any changes, no matter how small.  If you are 40 or older,  have a clinician do a breast exam (clinical breast exam or CBE) every year. Depending on your age, family history, and medical history, it may be recommended that you also have a yearly breast X-ray (mammogram).  If you  have a family history of breast cancer, talk with your health care provider about genetic screening.  If you are at high risk for breast cancer, talk with your health care provider about having an MRI and a mammogram every year.  Breast cancer (BRCA) gene test is recommended for women who have family members with BRCA-related cancers. Results of the assessment will determine the need for genetic counseling and BRCA1 and for BRCA2 testing. BRCA-related cancers include these types:  Breast. This occurs in males or females.  Ovarian.  Tubal. This may also be called fallopian tube cancer.  Cancer of the abdominal or pelvic lining (peritoneal cancer).  Prostate.  Pancreatic. Cervical, Uterine, and Ovarian Cancer  Your health care provider may recommend that you be screened regularly for cancer of the pelvic organs. These include your ovaries, uterus, and vagina. This screening involves a pelvic exam, which includes checking for microscopic changes to the surface of your cervix (Pap test).  For women ages 21-65, health care providers may recommend a pelvic exam and a Pap test every three years. For women ages 23-65, they may recommend the Pap test and pelvic exam, combined with testing for human papilloma virus (HPV), every five years. Some types of HPV increase your risk of cervical cancer. Testing for HPV may also be done on women of any age who have unclear Pap test results.  Other health care providers may not recommend any screening for nonpregnant women who are considered low risk for pelvic cancer and have no symptoms. Ask your health care provider if a screening pelvic exam is right for you.  If you have had past treatment for cervical cancer or a condition that could lead to cancer, you need Pap tests and screening for cancer for at least 20 years after your treatment. If Pap tests have been discontinued for you, your risk factors (such as having a new sexual partner) need to be reassessed  to determine if you should start having screenings again. Some women have medical problems that increase the chance of getting cervical cancer. In these cases, your health care provider may recommend that you have screening and Pap tests more often.  If you have a family history of uterine cancer or ovarian cancer, talk with your health care provider about genetic screening.  If you have vaginal bleeding after reaching menopause, tell your health care provider.  There are currently no reliable tests available to screen for ovarian cancer. Lung Cancer  Lung cancer screening is recommended for adults 99-83 years old who are at high risk for lung cancer because of a history of smoking. A yearly low-dose CT scan of the lungs is recommended if you:  Currently smoke.  Have a history of at least 30 pack-years of smoking and you currently smoke or have quit within the past 15 years. A pack-year is smoking an average of one pack of cigarettes per day for one year. Yearly screening should:  Continue until it has been 15 years since you quit.  Stop if you develop a health problem that would prevent you from having lung cancer treatment. Colorectal Cancer  This type of cancer can be detected and can often be prevented.  Routine colorectal cancer screening usually begins at age 72 and continues  through age 75.  If you have risk factors for colon cancer, your health care provider may recommend that you be screened at an earlier age.  If you have a family history of colorectal cancer, talk with your health care provider about genetic screening.  Your health care provider may also recommend using home test kits to check for hidden blood in your stool.  A small camera at the end of a tube can be used to examine your colon directly (sigmoidoscopy or colonoscopy). This is done to check for the earliest forms of colorectal cancer.  Direct examination of the colon should be repeated every 5-10 years until  age 75. However, if early forms of precancerous polyps or small growths are found or if you have a family history or genetic risk for colorectal cancer, you may need to be screened more often. Skin Cancer  Check your skin from head to toe regularly.  Monitor any moles. Be sure to tell your health care provider:  About any new moles or changes in moles, especially if there is a change in a mole's shape or color.  If you have a mole that is larger than the size of a pencil eraser.  If any of your family members has a history of skin cancer, especially at a young age, talk with your health care provider about genetic screening.  Always use sunscreen. Apply sunscreen liberally and repeatedly throughout the day.  Whenever you are outside, protect yourself by wearing long sleeves, pants, a wide-brimmed hat, and sunglasses. What should I know about osteoporosis? Osteoporosis is a condition in which bone destruction happens more quickly than new bone creation. After menopause, you may be at an increased risk for osteoporosis. To help prevent osteoporosis or the bone fractures that can happen because of osteoporosis, the following is recommended:  If you are 19-50 years old, get at least 1,000 mg of calcium and at least 600 mg of vitamin D per day.  If you are older than age 50 but younger than age 70, get at least 1,200 mg of calcium and at least 600 mg of vitamin D per day.  If you are older than age 70, get at least 1,200 mg of calcium and at least 800 mg of vitamin D per day. Smoking and excessive alcohol intake increase the risk of osteoporosis. Eat foods that are rich in calcium and vitamin D, and do weight-bearing exercises several times each week as directed by your health care provider. What should I know about how menopause affects my mental health? Depression may occur at any age, but it is more common as you become older. Common symptoms of depression include:  Low or sad  mood.  Changes in sleep patterns.  Changes in appetite or eating patterns.  Feeling an overall lack of motivation or enjoyment of activities that you previously enjoyed.  Frequent crying spells. Talk with your health care provider if you think that you are experiencing depression. What should I know about immunizations? It is important that you get and maintain your immunizations. These include:  Tetanus, diphtheria, and pertussis (Tdap) booster vaccine.  Influenza every year before the flu season begins.  Pneumonia vaccine.  Shingles vaccine. Your health care provider may also recommend other immunizations. This information is not intended to replace advice given to you by your health care provider. Make sure you discuss any questions you have with your health care provider. Document Released: 06/01/2005 Document Revised: 10/28/2015 Document Reviewed: 01/11/2015 Elsevier Interactive Patient   Education  2017 [employer] Maintenance, Female Adopting a healthy lifestyle and getting preventive care can go a long way to promote health and wellness. Talk with your health care provider about what schedule of regular examinations is right for you. This is a good chance for you to check in with your provider about disease prevention and staying healthy. In between checkups, there are plenty of things you can do on your own. Experts have done a lot of research about which lifestyle changes and preventive measures are most likely to keep you healthy. Ask your health care provider for more information. Weight and diet Eat a healthy diet  Be sure to include plenty of vegetables, fruits, low-fat dairy products, and lean protein.  Do not eat a lot of foods high in solid fats, added sugars, or salt.  Get regular exercise. This is one of the most important things you can do for your health.  Most adults should exercise for at least 150 minutes each week. The exercise should increase your  heart rate and make you sweat (moderate-intensity exercise).  Most adults should also do strengthening exercises at least twice a week. This is in addition to the moderate-intensity exercise. Maintain a healthy weight  Body mass index (BMI) is a measurement that can be used to identify possible weight problems. It estimates body fat based on height and weight. Your health care provider can help determine your BMI and help you achieve or maintain a healthy weight.  For females 18 years of age and older:  A BMI below 18.5 is considered underweight.  A BMI of 18.5 to 24.9 is normal.  A BMI of 25 to 29.9 is considered overweight.  A BMI of 30 and above is considered obese. Watch levels of cholesterol and blood lipids  You should start having your blood tested for lipids and cholesterol at 64 years of age, then have this test every 5 years.  You may need to have your cholesterol levels checked more often if:  Your lipid or cholesterol levels are high.  You are older than 64 years of age.  You are at high risk for heart disease. Cancer screening Lung Cancer  Lung cancer screening is recommended for adults 30-20 years old who are at high risk for lung cancer because of a history of smoking.  A yearly low-dose CT scan of the lungs is recommended for people who:  Currently smoke.  Have quit within the past 15 years.  Have at least a 30-pack-year history of smoking. A pack year is smoking an average of one pack of cigarettes a day for 1 year.  Yearly screening should continue until it has been 15 years since you quit.  Yearly screening should stop if you develop a health problem that would prevent you from having lung cancer treatment. Breast Cancer  Practice breast self-awareness. This means understanding how your breasts normally appear and feel.  It also means doing regular breast self-exams. Let your health care provider know about any changes, no matter how small.  If you  are in your 20s or 30s, you should have a clinical breast exam (CBE) by a health care provider every 1-3 years as part of a regular health exam.  If you are 31 or older, have a CBE every year. Also consider having a breast X-ray (mammogram) every year.  If you have a family history of breast cancer, talk to your health care provider about genetic screening.  If you are at  high risk for breast cancer, talk to your health care provider about having an MRI and a mammogram every year.  Breast cancer gene (BRCA) assessment is recommended for women who have family members with BRCA-related cancers. BRCA-related cancers include:  Breast.  Ovarian.  Tubal.  Peritoneal cancers.  Results of the assessment will determine the need for genetic counseling and BRCA1 and BRCA2 testing. Cervical Cancer  Your health care provider may recommend that you be screened regularly for cancer of the pelvic organs (ovaries, uterus, and vagina). This screening involves a pelvic examination, including checking for microscopic changes to the surface of your cervix (Pap test). You may be encouraged to have this screening done every 3 years, beginning at age 49.  For women ages 67-65, health care providers may recommend pelvic exams and Pap testing every 3 years, or they may recommend the Pap and pelvic exam, combined with testing for human papilloma virus (HPV), every 5 years. Some types of HPV increase your risk of cervical cancer. Testing for HPV may also be done on women of any age with unclear Pap test results.  Other health care providers may not recommend any screening for nonpregnant women who are considered low risk for pelvic cancer and who do not have symptoms. Ask your health care provider if a screening pelvic exam is right for you.  If you have had past treatment for cervical cancer or a condition that could lead to cancer, you need Pap tests and screening for cancer for at least 20 years after your  treatment. If Pap tests have been discontinued, your risk factors (such as having a new sexual partner) need to be reassessed to determine if screening should resume. Some women have medical problems that increase the chance of getting cervical cancer. In these cases, your health care provider may recommend more frequent screening and Pap tests. Colorectal Cancer  This type of cancer can be detected and often prevented.  Routine colorectal cancer screening usually begins at 64 years of age and continues through 64 years of age.  Your health care provider may recommend screening at an earlier age if you have risk factors for colon cancer.  Your health care provider may also recommend using home test kits to check for hidden blood in the stool.  A small camera at the end of a tube can be used to examine your colon directly (sigmoidoscopy or colonoscopy). This is done to check for the earliest forms of colorectal cancer.  Routine screening usually begins at age 27.  Direct examination of the colon should be repeated every 5-10 years through 64 years of age. However, you may need to be screened more often if early forms of precancerous polyps or small growths are found. Skin Cancer  Check your skin from head to toe regularly.  Tell your health care provider about any new moles or changes in moles, especially if there is a change in a mole's shape or color.  Also tell your health care provider if you have a mole that is larger than the size of a pencil eraser.  Always use sunscreen. Apply sunscreen liberally and repeatedly throughout the day.  Protect yourself by wearing long sleeves, pants, a wide-brimmed hat, and sunglasses whenever you are outside. Heart disease, diabetes, and high blood pressure  High blood pressure causes heart disease and increases the risk of stroke. High blood pressure is more likely to develop in:  People who have blood pressure in the high end of the normal  range  (130-139/85-89 mm Hg).  People who are overweight or obese.  People who are African American.  If you are 75-34 years of age, have your blood pressure checked every 3-5 years. If you are 51 years of age or older, have your blood pressure checked every year. You should have your blood pressure measured twice-once when you are at a hospital or clinic, and once when you are not at a hospital or clinic. Record the average of the two measurements. To check your blood pressure when you are not at a hospital or clinic, you can use:  An automated blood pressure machine at a pharmacy.  A home blood pressure monitor.  If you are between 44 years and 62 years old, ask your health care provider if you should take aspirin to prevent strokes.  Have regular diabetes screenings. This involves taking a blood sample to check your fasting blood sugar level.  If you are at a normal weight and have a low risk for diabetes, have this test once every three years after 64 years of age.  If you are overweight and have a high risk for diabetes, consider being tested at a younger age or more often. Preventing infection Hepatitis B  If you have a higher risk for hepatitis B, you should be screened for this virus. You are considered at high risk for hepatitis B if:  You were born in a country where hepatitis B is common. Ask your health care provider which countries are considered high risk.  Your parents were born in a high-risk country, and you have not been immunized against hepatitis B (hepatitis B vaccine).  You have HIV or AIDS.  You use needles to inject street drugs.  You live with someone who has hepatitis B.  You have had sex with someone who has hepatitis B.  You get hemodialysis treatment.  You take certain medicines for conditions, including cancer, organ transplantation, and autoimmune conditions. Hepatitis C  Blood testing is recommended for:  Everyone born from [birth year] through  1963.  Anyone with known risk factors for hepatitis C. Sexually transmitted infections (STIs)  You should be screened for sexually transmitted infections (STIs) including gonorrhea and chlamydia if:  You are sexually active and are younger than 64 years of age.  You are older than 64 years of age and your health care provider tells you that you are at risk for this type of infection.  Your sexual activity has changed since you were last screened and you are at an increased risk for chlamydia or gonorrhea. Ask your health care provider if you are at risk.  If you do not have HIV, but are at risk, it may be recommended that you take a prescription medicine daily to prevent HIV infection. This is called pre-exposure prophylaxis (PrEP). You are considered at risk if:  You are sexually active and do not regularly use condoms or know the HIV status of your partner(s).  You take drugs by injection.  You are sexually active with a partner who has HIV. Talk with your health care provider about whether you are at high risk of being infected with HIV. If you choose to begin PrEP, you should first be tested for HIV. You should then be tested every 3 months for as long as you are taking PrEP. Pregnancy  If you are premenopausal and you may become pregnant, ask your health care provider about preconception counseling.  If you may become pregnant, take 400 to 800  micrograms (mcg) of folic acid every day.  If you want to prevent pregnancy, talk to your health care provider about birth control (contraception). Osteoporosis and menopause  Osteoporosis is a disease in which the bones lose minerals and strength with aging. This can result in serious bone fractures. Your risk for osteoporosis can be identified using a bone density scan.  If you are 55 years of age or older, or if you are at risk for osteoporosis and fractures, ask your health care provider if you should be screened.  Ask your health  care provider whether you should take a calcium or vitamin D supplement to lower your risk for osteoporosis.  Menopause may have certain physical symptoms and risks.  Hormone replacement therapy may reduce some of these symptoms and risks. Talk to your health care provider about whether hormone replacement therapy is right for you. Follow these instructions at home:  Schedule regular health, dental, and eye exams.  Stay current with your immunizations.  Do not use any tobacco products including cigarettes, chewing tobacco, or electronic cigarettes.  If you are pregnant, do not drink alcohol.  If you are breastfeeding, limit how much and how often you drink alcohol.  Limit alcohol intake to no more than 1 drink per day for nonpregnant women. One drink equals 12 ounces of beer, 5 ounces of wine, or 1 ounces of hard liquor.  Do not use street drugs.  Do not share needles.  Ask your health care provider for help if you need support or information about quitting drugs.  Tell your health care provider if you often feel depressed.  Tell your health care provider if you have ever been abused or do not feel safe at home. This information is not intended to replace advice given to you by your health care provider. Make sure you discuss any questions you have with your health care provider. Document Released: 10/23/2010 Document Revised: 09/15/2015 Document Reviewed: 01/11/2015 Elsevier Interactive Patient Education  2017 [employer]. Menopause Menopause is the normal time of life when menstrual periods stop completely. Menopause is complete when you have missed 12 consecutive menstrual periods. It usually occurs between the ages of 28 years and 49 years. Very rarely does a woman develop menopause before the age of 75 years. At menopause, your ovaries stop producing the female hormones estrogen and progesterone. This can cause undesirable symptoms and also affect your health. Sometimes the  symptoms may occur 4-5 years before the menopause begins. There is no relationship between menopause and:  Oral contraceptives.  Number of children you had.  Race.  The age your menstrual periods started (menarche). Heavy smokers and very thin women may develop menopause earlier in life. What are the causes?  The ovaries stop producing the female hormones estrogen and progesterone. Other causes include:  Surgery to remove both ovaries.  The ovaries stop functioning for no known reason.  Tumors of the pituitary gland in the brain.  Medical disease that affects the ovaries and hormone production.  Radiation treatment to the abdomen or pelvis.  Chemotherapy that affects the ovaries. What are the signs or symptoms?  Hot flashes.  Night sweats.  Decrease in sex drive.  Vaginal dryness and thinning of the vagina causing painful intercourse.  Dryness of the skin and developing wrinkles.  Headaches.  Tiredness.  Irritability.  Memory problems.  Weight gain.  Bladder infections.  Hair growth of the face and chest.  Infertility. More serious symptoms include:  Loss of bone (osteoporosis) causing  breaks (fractures).  Depression.  Hardening and narrowing of the arteries (atherosclerosis) causing heart attacks and strokes. How is this diagnosed?  When the menstrual periods have stopped for 12 straight months.  Physical exam.  Hormone studies of the blood. How is this treated? There are many treatment choices and nearly as many questions about them. The decisions to treat or not to treat menopausal changes is an individual choice made with your health care provider. Your health care provider can discuss the treatments with you. Together, you can decide which treatment will work best for you. Your treatment choices may include:  Hormone therapy (estrogen and progesterone).  Non-hormonal medicines.  Treating the individual symptoms with medicine (for example  antidepressants for depression).  Herbal medicines that may help specific symptoms.  Counseling by a psychiatrist or psychologist.  Group therapy.  Lifestyle changes including:  Eating healthy.  Regular exercise.  Limiting caffeine and alcohol.  Stress management and meditation.  No treatment. Follow these instructions at home:  Take the medicine your health care provider gives you as directed.  Get plenty of sleep and rest.  Exercise regularly.  Eat a diet that contains calcium (good for the bones) and soy products (acts like estrogen hormone).  Avoid alcoholic beverages.  Do not smoke.  If you have hot flashes, dress in layers.  Take supplements, calcium, and vitamin D to strengthen bones.  You can use over-the-counter lubricants or moisturizers for vaginal dryness.  Group therapy is sometimes very helpful.  Acupuncture may be helpful in some cases. Contact a health care provider if:  You are not sure you are in menopause.  You are having menopausal symptoms and need advice and treatment.  You are still having menstrual periods after age 55 years.  You have pain with intercourse.  Menopause is complete (no menstrual period for 12 months) and you develop vaginal bleeding.  You need a referral to a specialist (gynecologist, psychiatrist, or psychologist) for treatment. Get help right away if:  You have severe depression.  You have excessive vaginal bleeding.  You fell and think you have a broken bone.  You have pain when you urinate.  You develop leg or chest pain.  You have a fast pounding heart beat (palpitations).  You have severe headaches.  You develop vision problems.  You feel a lump in your breast.  You have abdominal pain or severe indigestion. This information is not intended to replace advice given to you by your health care provider. Make sure you discuss any questions you have with your health care provider. Document Released:  06/30/2003 Document Revised: 09/15/2015 Document Reviewed: 11/06/2012 Elsevier Interactive Patient Education  2017 [employer].

## 2016-07-24 NOTE — Progress Notes (Addendum)
Patient presents for Annual Exam. She had double knee replacement in December 2017.  [facility] pick up Confirmation#[id number]

## 2016-07-27 LAB — PAP IG W/ RFLX HPV ASCU

## 2017-05-02 ENCOUNTER — Other Ambulatory Visit: Payer: Self-pay | Admitting: Obstetrics

## 2017-05-02 DIAGNOSIS — Z1231 Encounter for screening mammogram for malignant neoplasm of breast: Secondary | ICD-10-CM

## 2017-05-24 ENCOUNTER — Encounter: Payer: Self-pay | Admitting: Gastroenterology

## 2017-05-30 ENCOUNTER — Encounter: Payer: Self-pay | Admitting: Gastroenterology

## 2017-07-16 ENCOUNTER — Ambulatory Visit: Payer: Self-pay

## 2017-07-16 ENCOUNTER — Ambulatory Visit
Admission: RE | Admit: 2017-07-16 | Discharge: 2017-07-16 | Disposition: A | Discharge status: Home / Self Care | Payer: PPO | Source: Ambulatory Visit | Attending: Obstetrics | Admitting: Obstetrics

## 2017-07-16 DIAGNOSIS — Z1231 Encounter for screening mammogram for malignant neoplasm of breast: Secondary | ICD-10-CM | POA: Diagnosis not present

## 2017-07-25 ENCOUNTER — Encounter: Payer: Self-pay | Admitting: Obstetrics

## 2017-07-25 ENCOUNTER — Ambulatory Visit (INDEPENDENT_AMBULATORY_CARE_PROVIDER_SITE_OTHER): Payer: PPO | Admitting: Obstetrics

## 2017-07-25 ENCOUNTER — Other Ambulatory Visit (HOSPITAL_COMMUNITY)
Admission: RE | Admit: 2017-07-25 | Discharge: 2017-07-25 | Disposition: A | Discharge status: Home / Self Care | Payer: PPO | Source: Ambulatory Visit | Attending: Obstetrics | Admitting: Obstetrics

## 2017-07-25 VITALS — BP 129/82 | HR 95 | Ht 66.0 in | Wt 259.4 lb

## 2017-07-25 DIAGNOSIS — Z780 Asymptomatic menopausal state: Secondary | ICD-10-CM

## 2017-07-25 DIAGNOSIS — E7800 Pure hypercholesterolemia, unspecified: Secondary | ICD-10-CM | POA: Diagnosis not present

## 2017-07-25 DIAGNOSIS — N898 Other specified noninflammatory disorders of vagina: Secondary | ICD-10-CM | POA: Diagnosis not present

## 2017-07-25 DIAGNOSIS — Z01411 Encounter for gynecological examination (general) (routine) with abnormal findings: Secondary | ICD-10-CM | POA: Diagnosis not present

## 2017-07-25 DIAGNOSIS — E119 Type 2 diabetes mellitus without complications: Secondary | ICD-10-CM | POA: Diagnosis not present

## 2017-07-25 DIAGNOSIS — Z01419 Encounter for gynecological examination (general) (routine) without abnormal findings: Secondary | ICD-10-CM

## 2017-07-25 DIAGNOSIS — N951 Menopausal and female climacteric states: Secondary | ICD-10-CM

## 2017-07-25 DIAGNOSIS — Z1151 Encounter for screening for human papillomavirus (HPV): Secondary | ICD-10-CM | POA: Insufficient documentation

## 2017-07-25 DIAGNOSIS — F17210 Nicotine dependence, cigarettes, uncomplicated: Secondary | ICD-10-CM

## 2017-07-25 NOTE — Progress Notes (Signed)
Presents for AEX/PAP. C/o Hot flashes. She wants weight management, not happy with body image.

## 2017-07-25 NOTE — Progress Notes (Signed)
Subjective:        Stephanie Fox is a 67 y.o. female here for a routine exam.  Current complaints: Hot flashes.  Not pleased with weight.  Desires weight management.    Personal health questionnaire:  Is patient [name], have a family history of breast and/or ovarian cancer: no Is there a family history of uterine cancer diagnosed at age < 65, gastrointestinal cancer, urinary tract cancer, family member who is a Field seismologist syndrome-associated carrier: no Is the patient overweight and hypertensive, family history of diabetes, personal history of gestational diabetes, preeclampsia or PCOS: no Is patient over 54, have PCOS,  family history of premature CHD under age 6, diabetes, smoke, have hypertension or peripheral artery disease:  no At any time, has a partner hit, kicked or otherwise hurt or frightened you?: no Over the past 2 weeks, have you felt down, depressed or hopeless?: no Over the past 2 weeks, have you felt little interest or pleasure in doing things?:no   Gynecologic History No LMP recorded. Patient is postmenopausal. Contraception: post menopausal status Last Pap: 2018. Results were: normal Last mammogram: 2019. Results were: normal  Obstetric History OB History  Gravida Para Term Preterm AB Living  2 0 0 0 2 0  SAB TAB Ectopic Multiple Live Births  0 2 0 0      # Outcome Date GA Lbr Len/2nd Weight Sex Delivery Anes PTL Lv  2 TAB           1 TAB             Past Medical History:  Diagnosis Date  . Anemia    as a child   . Arthritis   . Diabetes mellitus ([place])    type II   . Dyspnea    with exertion   . Edema    lower extremities   . GERD (gastroesophageal reflux disease)   . History of kidney stones   . Hyperlipidemia   . Hypertension     Past Surgical History:  Procedure Laterality Date  . BREAST REDUCTION SURGERY    . KNEE SURGERY    . REDUCTION MAMMAPLASTY Bilateral 15 years ago  . thumb surgery    . TOTAL KNEE ARTHROPLASTY Bilateral  04/06/2016   Procedure: BILATERAL TOTAL KNEE ARTHROPLASTY;  Surgeon: Paralee Cancel, MD;  Location: [facility];  Service: Orthopedics;  Laterality: Bilateral;  Adductor Block  . underarm surgery       Current Outpatient Medications:  .  acetaminophen (TYLENOL) 500 MG tablet, Take 2 tablets (1,000 mg total) by mouth every 8 (eight) hours., Disp: 30 tablet, Rfl: 0 .  aspirin 81 MG chewable tablet, Chew by mouth daily., Disp: , Rfl:  .  benzonatate (TESSALON) 100 MG capsule, Take 100 mg by mouth 2 (two) times daily as needed for cough. , Disp: , Rfl:  .  Cholecalciferol (VITAMIN D3) 2000 UNITS TABS, Take 2,000 Units by mouth daily. , Disp: , Rfl:  .  furosemide (LASIX) 40 MG tablet, Take 40 mg by mouth daily as needed for fluid. , Disp: , Rfl:  .  Liraglutide (VICTOZA) 18 MG/3ML SOPN, Inject 0.6 mg into the skin daily. , Disp: , Rfl:  .  metFORMIN (GLUCOPHAGE) 500 MG tablet, Take 500 mg by mouth 2 (two) times daily with a meal., Disp: , Rfl:  .  valsartan-hydrochlorothiazide (DIOVAN-HCT) 80-12.5 MG per tablet, Take 1 tablet by mouth daily., Disp: , Rfl:  .  vitamin E 1000 UNIT capsule, Take 1,000  Units by mouth daily., Disp: , Rfl:  .  calcium carbonate (TUMS - DOSED IN MG ELEMENTAL CALCIUM) 500 MG chewable tablet, Chew 3 tablets by mouth daily as needed for indigestion or heartburn., Disp: , Rfl:  .  docusate sodium (COLACE) 100 MG capsule, Take 1 capsule (100 mg total) by mouth 2 (two) times daily. (Patient not taking: Reported on 07/24/2016), Disp: 10 capsule, Rfl: 0 .  ezetimibe (ZETIA) 10 MG tablet, Take 10 mg by mouth daily., Disp: , Rfl: 6 .  ferrous sulfate 325 (65 FE) MG tablet, Take 1 tablet (325 mg total) by mouth 3 (three) times daily after meals. (Patient not taking: Reported on 07/24/2016), Disp: , Rfl: 3 .  methocarbamol (ROBAXIN) 500 MG tablet, Take 1 tablet (500 mg total) by mouth every 6 (six) hours as needed for muscle spasms. (Patient not taking: Reported on 07/25/2017), Disp: 40 tablet, Rfl:  0 .  Multiple Vitamin (MULTIVITAMIN) tablet, Take 1 tablet by mouth daily. Reported on 07/21/2015, Disp: , Rfl:  .  nicotine (NICODERM CQ - DOSED IN MG/24 HOURS) 14 mg/24hr patch, Place 1 patch (14 mg total) onto the skin daily. (Patient not taking: Reported on 07/25/2017), Disp: 28 patch, Rfl: 0 .  Omega-3 Fatty Acids (FISH OIL) 1000 MG CAPS, Take 2 capsules by mouth daily. , Disp: , Rfl:  .  ondansetron (ZOFRAN) 4 MG tablet, Take 1 tablet (4 mg total) by mouth every 6 (six) hours as needed for nausea. (Patient not taking: Reported on 07/24/2016), Disp: 30 tablet, Rfl: 0 .  oxyCODONE (OXY IR/ROXICODONE) 5 MG immediate release tablet, Take 1-3 tablets (5-15 mg total) by mouth every 4 (four) hours as needed for severe pain. (Patient not taking: Reported on 07/24/2016), Disp: 90 tablet, Rfl: 0 .  polyethylene glycol (MIRALAX / GLYCOLAX) packet, Take 17 g by mouth 2 (two) times daily. (Patient not taking: Reported on 07/24/2016), Disp: 14 each, Rfl: 0 .  rivaroxaban (XARELTO) 10 MG TABS tablet, Take 1 tablet (10 mg total) by mouth daily. Take for 14 days, then start aspirin., Disp: 14 tablet, Rfl: 0 No Known Allergies  Social History   Tobacco Use  . Smoking status: Current Every Day Smoker    Packs/day: 0.50    Types: Cigarettes  . Smokeless tobacco: Never Used  Substance Use Topics  . Alcohol use: No    Family History  Problem Relation Age of Onset  . Colon cancer Neg Hx       Review of Systems  Constitutional: negative for fatigue and weight loss Respiratory: negative for cough and wheezing Cardiovascular: negative for chest pain, fatigue and palpitations Gastrointestinal: negative for abdominal pain and change in bowel habits Musculoskeletal:negative for myalgias Neurological: negative for gait problems and tremors Behavioral/Psych: negative for abusive relationship, depression Endocrine: positive for hot flashes Genitourinary:negative for abnormal menstrual periods, genital lesions, hot  flashes, sexual problems and vaginal discharge Integument/breast: negative for breast lump, breast tenderness, nipple discharge and skin lesion(s)    Objective:       BP 129/82 (BP Location: Left Arm, Cuff Size: Large)   Pulse 95   Ht 5\' 6"  (1.676 m)   Wt 259 lb 6.4 oz (117.7 kg)   BMI 41.87 kg/m  General:   alert  Skin:   no rash or abnormalities  Lungs:   clear to auscultation bilaterally  Heart:   regular rate and rhythm, S1, S2 normal, no murmur, click, rub or gallop  Breasts:   normal without suspicious masses, skin or  nipple changes or axillary nodes  Abdomen:  normal findings: no organomegaly, soft, non-tender and no hernia  Pelvis:  External genitalia: normal general appearance Urinary system: urethral meatus normal and bladder without fullness, nontender Vaginal: normal without tenderness, induration or masses Cervix: normal appearance Adnexa: normal bimanual exam Uterus: anteverted and non-tender, normal size   Lab Review Urine pregnancy test Labs reviewed yes Radiologic studies reviewed yes  50% of 20 min visit spent on counseling and coordination of care.   Assessment:    .1. Encounter for routine gynecological examination with Papanicolaou smear of cervix Rx: - Cytology - PAP  2. Postmenopause   3. Hot flashes due to menopause - recommended trial of increased dietary phytoestrogens   4. Tobacco dependence due to cigarettes - recommended program of medical and behavioral modification for smoking cessation   Plan:    Education reviewed: calcium supplements, depression evaluation, low fat, low cholesterol diet, safe sex/STD prevention, self breast exams, skin cancer screening, smoking cessation and weight bearing exercise. Follow up in: 1 year.   No orders of the defined types were placed in this encounter.  No orders of the defined types were placed in this encounter.   Shelly Bombard MD 07-25-2017

## 2017-07-26 LAB — CERVICOVAGINAL ANCILLARY ONLY
BACTERIAL VAGINITIS: NEGATIVE
Candida vaginitis: NEGATIVE
TRICH (WINDOWPATH): NEGATIVE

## 2017-07-26 NOTE — Addendum Note (Signed)
Addended by: Baltazar Najjar A on: 07/26/2017 08:26 AM   Modules accepted: Orders

## 2017-07-29 LAB — CYTOLOGY - PAP
Diagnosis: NEGATIVE
HPV: NOT DETECTED

## 2017-07-30 ENCOUNTER — Ambulatory Visit (AMBULATORY_SURGERY_CENTER): Payer: Self-pay

## 2017-07-30 ENCOUNTER — Encounter: Payer: Self-pay | Admitting: Gastroenterology

## 2017-07-30 VITALS — Ht 66.0 in | Wt 261.2 lb

## 2017-07-30 DIAGNOSIS — Z86010 Personal history of colonic polyps: Secondary | ICD-10-CM

## 2017-07-30 MED ORDER — NA SULFATE-K SULFATE-MG SULF 17.5-3.13-1.6 GM/177ML PO SOLN: 1.0000 | Freq: Once | ORAL | 0 refills | Status: AC

## 2017-07-30 NOTE — Progress Notes (Signed)
Per pt, no allergies to soy or egg products.Pt not taking any weight loss meds or using  O2 at home.  Emmi video sent to pt's email. 

## 2017-08-01 DIAGNOSIS — E7800 Pure hypercholesterolemia, unspecified: Secondary | ICD-10-CM | POA: Diagnosis not present

## 2017-08-01 DIAGNOSIS — I10 Essential (primary) hypertension: Secondary | ICD-10-CM | POA: Diagnosis not present

## 2017-08-01 DIAGNOSIS — E119 Type 2 diabetes mellitus without complications: Secondary | ICD-10-CM | POA: Diagnosis not present

## 2017-08-07 ENCOUNTER — Telehealth: Payer: Self-pay | Admitting: Gastroenterology

## 2017-08-07 DIAGNOSIS — Z6841 Body Mass Index (BMI) 40.0 and over, adult: Secondary | ICD-10-CM | POA: Diagnosis not present

## 2017-08-07 DIAGNOSIS — Z1159 Encounter for screening for other viral diseases: Secondary | ICD-10-CM | POA: Diagnosis not present

## 2017-08-07 DIAGNOSIS — E119 Type 2 diabetes mellitus without complications: Secondary | ICD-10-CM | POA: Diagnosis not present

## 2017-08-07 DIAGNOSIS — J441 Chronic obstructive pulmonary disease with (acute) exacerbation: Secondary | ICD-10-CM | POA: Diagnosis not present

## 2017-08-07 DIAGNOSIS — E7800 Pure hypercholesterolemia, unspecified: Secondary | ICD-10-CM | POA: Diagnosis not present

## 2017-08-07 DIAGNOSIS — Z23 Encounter for immunization: Secondary | ICD-10-CM | POA: Diagnosis not present

## 2017-08-07 DIAGNOSIS — E1122 Type 2 diabetes mellitus with diabetic chronic kidney disease: Secondary | ICD-10-CM | POA: Diagnosis not present

## 2017-08-07 DIAGNOSIS — Z0000 Encounter for general adult medical examination without abnormal findings: Secondary | ICD-10-CM | POA: Diagnosis not present

## 2017-08-07 DIAGNOSIS — I10 Essential (primary) hypertension: Secondary | ICD-10-CM | POA: Diagnosis not present

## 2017-08-07 DIAGNOSIS — Z7984 Long term (current) use of oral hypoglycemic drugs: Secondary | ICD-10-CM | POA: Diagnosis not present

## 2017-08-07 DIAGNOSIS — F17210 Nicotine dependence, cigarettes, uncomplicated: Secondary | ICD-10-CM | POA: Diagnosis not present

## 2017-08-07 DIAGNOSIS — Z79899 Other long term (current) drug therapy: Secondary | ICD-10-CM | POA: Diagnosis not present

## 2017-08-07 DIAGNOSIS — E559 Vitamin D deficiency, unspecified: Secondary | ICD-10-CM | POA: Diagnosis not present

## 2017-08-07 NOTE — Telephone Encounter (Signed)
Advised to take her antibiotic.

## 2017-08-08 ENCOUNTER — Other Ambulatory Visit: Payer: Self-pay | Admitting: Family Medicine

## 2017-08-08 DIAGNOSIS — F17210 Nicotine dependence, cigarettes, uncomplicated: Secondary | ICD-10-CM

## 2017-08-13 ENCOUNTER — Encounter: Payer: Self-pay | Admitting: Gastroenterology

## 2017-08-13 ENCOUNTER — Ambulatory Visit (AMBULATORY_SURGERY_CENTER): Payer: PPO | Admitting: Gastroenterology

## 2017-08-13 VITALS — BP 123/79 | HR 86 | Temp 96.9°F | Resp 14 | Ht 66.0 in | Wt 261.0 lb

## 2017-08-13 DIAGNOSIS — Z86010 Personal history of colonic polyps: Secondary | ICD-10-CM

## 2017-08-13 DIAGNOSIS — K633 Ulcer of intestine: Secondary | ICD-10-CM

## 2017-08-13 DIAGNOSIS — K6389 Other specified diseases of intestine: Secondary | ICD-10-CM | POA: Diagnosis not present

## 2017-08-13 DIAGNOSIS — Z1211 Encounter for screening for malignant neoplasm of colon: Secondary | ICD-10-CM | POA: Diagnosis not present

## 2017-08-13 DIAGNOSIS — D122 Benign neoplasm of ascending colon: Secondary | ICD-10-CM

## 2017-08-13 MED ORDER — SODIUM CHLORIDE 0.9 % IV SOLN: 500.0000 mL | Freq: Once | INTRAVENOUS | Status: DC

## 2017-08-13 NOTE — Progress Notes (Signed)
Called to room to assist during endoscopic procedure.  Patient ID and intended procedure confirmed with present staff. Received instructions for my participation in the procedure from the performing physician.  

## 2017-08-13 NOTE — Progress Notes (Signed)
Pt's states no medical or surgical changes since previsit or office visit. 

## 2017-08-13 NOTE — Progress Notes (Signed)
To PACU, VSS. Report to RN.tb 

## 2017-08-13 NOTE — Patient Instructions (Signed)
YOU HAD AN ENDOSCOPIC PROCEDURE TODAY AT [facility]:   Refer to the procedure report that was given to you for any specific questions about what was found during the examination.  If the procedure report does not answer your questions, please call your gastroenterologist to clarify.  If you requested that your care partner not be given the details of your procedure findings, then the procedure report has been included in a sealed envelope for you to review at your convenience later.  YOU SHOULD EXPECT: Some feelings of bloating in the abdomen. Passage of more gas than usual.  Walking can help get rid of the air that was put into your GI tract during the procedure and reduce the bloating. If you had a lower endoscopy (such as a colonoscopy or flexible sigmoidoscopy) you may notice spotting of blood in your stool or on the toilet paper. If you underwent a bowel prep for your procedure, you may not have a normal bowel movement for a few days.  Please Note:  You might notice some irritation and congestion in your nose or some drainage.  This is from the oxygen used during your procedure.  There is no need for concern and it should clear up in a day or so.  SYMPTOMS TO REPORT IMMEDIATELY:   Following lower endoscopy (colonoscopy or flexible sigmoidoscopy):  Excessive amounts of blood in the stool  Significant tenderness or worsening of abdominal pains  Swelling of the abdomen that is new, acute  Fever of 100F or higher  For urgent or emergent issues, a gastroenterologist can be reached at any hour by calling [phone].   DIET:  We do recommend a small meal at first, but then you may proceed to your regular diet.  Drink plenty of fluids but you should avoid alcoholic beverages for 24 hours.  ACTIVITY:  You should plan to take it easy for the rest of today and you should NOT DRIVE or use heavy machinery until tomorrow (because of the sedation medicines used during the test).     FOLLOW UP: Our staff will call the number listed on your records the next business day following your procedure to check on you and address any questions or concerns that you may have regarding the information given to you following your procedure. If we do not reach you, we will leave a message.  However, if you are feeling well and you are not experiencing any problems, there is no need to return our call.  We will assume that you have returned to your regular daily activities without incident.  If any biopsies were taken you will be contacted by phone or by letter within the next 1-3 weeks.  Please call us at [phone] if you have not heard about the biopsies in 3 weeks.   Polyps (handout given) Hemorrhoids (handout given) Diverticulosis (handout given) No ibuprofen, naproxen or other non-steroidal anti-inflammatory drugs for 2 weeks after polyp removal. Tylenol okay if needed. Await for biopsy reults   SIGNATURES/CONFIDENTIALITY: You and/or your care partner have signed paperwork which will be entered into your electronic medical record.  These signatures attest to the fact that that the information above on your After Visit Summary has been reviewed and is understood.  Full responsibility of the confidentiality of this discharge information lies with you and/or your care-partner.

## 2017-08-13 NOTE — Op Note (Signed)
[place] Patient Name: Stephanie Fox Procedure Date: 08/13/2017 8:52 AM MRN: [id number] Endoscopist: Mauri Pole , MD Age: 67 Referring MD:  Date of Birth: 1950-09-30 Gender: Female Account #: [id number] Procedure:                Colonoscopy Indications:              High risk colon cancer surveillance: Personal                            history of colonic polyps, Surveillance: Personal                            history of adenomatous polyps on last colonoscopy 5                            years ago, High risk colon cancer surveillance:                            Personal history of adenoma less than 10 mm in size Medicines:                Monitored Anesthesia Care Procedure:                Pre-Anesthesia Assessment:                           - Prior to the procedure, a History and Physical                            was performed, and patient medications and                            allergies were reviewed. The patient's tolerance of                            previous anesthesia was also reviewed. The risks                            and benefits of the procedure and the sedation                            options and risks were discussed with the patient.                            All questions were answered, and informed consent                            was obtained. Prior Anticoagulants: The patient has                            taken no previous anticoagulant or antiplatelet                            agents. ASA Grade Assessment: III - A patient with  severe systemic disease. After reviewing the risks                            and benefits, the patient was deemed in                            satisfactory condition to undergo the procedure.                           After obtaining informed consent, the colonoscope                            was passed under direct vision. Throughout the                            procedure,  the patient's blood pressure, pulse, and                            oxygen saturations were monitored continuously. The                            Model PCF-H190DL [id number]) scope was introduced                            through the anus and advanced to the the cecum,                            identified by appendiceal orifice and ileocecal                            valve. The colonoscopy was performed without                            difficulty. The patient tolerated the procedure                            well. The quality of the bowel preparation was                            good. The ileocecal valve, appendiceal orifice, and                            rectum were photographed. Scope In: 8:56:34 AM Scope Out: 9:15:53 AM Scope Withdrawal Time: 0 hours 15 minutes 4 seconds  Total Procedure Duration: 0 hours 19 minutes 19 seconds  Findings:                 The perianal and digital rectal examinations were                            normal.                           A 4 mm polyp was found in the ascending colon. The  polyp was sessile. The polyp was removed with a                            cold snare. Resection and retrieval were complete.                           A few one to two mm ulcers and erosions were found                            in the sigmoid colon, in the transverse colon and                            in the ascending colon. No bleeding was present.                            Biopsies were taken with a cold forceps for                            histology.                           A few small and large-mouthed diverticula were                            found in the sigmoid colon and descending colon.                           Non-bleeding internal hemorrhoids were found during                            retroflexion. The hemorrhoids were small. Complications:            No immediate complications. Estimated Blood Loss:     Estimated  blood loss was minimal. Impression:               - One 4 mm polyp in the ascending colon, removed                            with a cold snare. Resected and retrieved.                           - A few ulcers in the sigmoid colon, in the                            transverse colon and in the ascending colon likely                            secondary to NSAID's. Biopsied.                           - Diverticulosis in the sigmoid colon and in the                            descending colon.                           -  Non-bleeding internal hemorrhoids. Recommendation:           - Patient has a contact number available for                            emergencies. The signs and symptoms of potential                            delayed complications were discussed with the                            patient. Return to normal activities tomorrow.                            Written discharge instructions were provided to the                            patient.                           - Resume previous diet.                           - Continue present medications.                           - Await pathology results.                           - Repeat colonoscopy in 5-10 years for surveillance                            based on pathology results.                           - No ibuprofen, naproxen, or other non-steroidal                            anti-inflammatory drugs. Mauri Pole, MD 08/13/2017 9:25:34 AM This report has been signed electronically.

## 2017-08-14 ENCOUNTER — Telehealth: Payer: Self-pay

## 2017-08-14 NOTE — Telephone Encounter (Signed)
  Follow up Call-  Call back number 08/13/2017  Post procedure Call Back phone  # [phone]  Permission to leave phone message Yes  Some recent data might be hidden     Patient questions:  Do you have a fever, pain , or abdominal swelling? No. Pain Score  0 *  Have you tolerated food without any problems? Yes.    Have you been able to return to your normal activities? Yes.    Do you have any questions about your discharge instructions: Diet   No. Medications  No. Follow up visit  No.  Do you have questions or concerns about your Care? No.  Actions: * If pain score is 4 or above: No action needed, pain <4.

## 2017-08-16 ENCOUNTER — Ambulatory Visit
Admission: RE | Admit: 2017-08-16 | Discharge: 2017-08-16 | Disposition: A | Discharge status: Home / Self Care | Payer: PPO | Source: Ambulatory Visit | Attending: Family Medicine | Admitting: Family Medicine

## 2017-08-16 ENCOUNTER — Encounter: Payer: Self-pay | Admitting: Gastroenterology

## 2017-08-16 DIAGNOSIS — F17210 Nicotine dependence, cigarettes, uncomplicated: Secondary | ICD-10-CM | POA: Diagnosis not present

## 2017-08-26 ENCOUNTER — Other Ambulatory Visit: Payer: Self-pay | Admitting: Family Medicine

## 2017-08-26 DIAGNOSIS — N63 Unspecified lump in unspecified breast: Secondary | ICD-10-CM

## 2017-08-30 ENCOUNTER — Ambulatory Visit
Admission: RE | Admit: 2017-08-30 | Discharge: 2017-08-30 | Disposition: A | Discharge status: Home / Self Care | Payer: PPO | Source: Ambulatory Visit | Attending: Family Medicine | Admitting: Family Medicine

## 2017-08-30 ENCOUNTER — Ambulatory Visit: Payer: Self-pay

## 2017-08-30 DIAGNOSIS — N63 Unspecified lump in unspecified breast: Secondary | ICD-10-CM

## 2017-09-20 ENCOUNTER — Encounter: Payer: Self-pay | Admitting: Pulmonary Disease

## 2017-09-20 ENCOUNTER — Ambulatory Visit: Payer: PPO | Admitting: Pulmonary Disease

## 2017-09-20 VITALS — BP 134/80 | HR 90 | Ht 66.0 in | Wt 257.4 lb

## 2017-09-20 DIAGNOSIS — R0602 Shortness of breath: Secondary | ICD-10-CM | POA: Diagnosis not present

## 2017-09-20 DIAGNOSIS — F17210 Nicotine dependence, cigarettes, uncomplicated: Secondary | ICD-10-CM

## 2017-09-20 MED ORDER — NICOTINE 21-14-7 MG/24HR TD KIT: PACK | TRANSDERMAL | 0 refills | Status: DC

## 2017-09-20 NOTE — Progress Notes (Signed)
Stephanie Fox    [id number]    March 21, 1951  Primary Care Physician:Wolters, Ivin Booty, MD  Referring Physician: Jonathon Jordan, MD [place], [place] [address]  Chief complaint: Consult for emphysema, pulmonary nodule  HPI: 67 year old with history of hypertension, diabetes, hyperlipidemia, active smoker She had a screening CT scan of the chest which showed emphysema and pulmonary nodule and has been referred here for further evaluation Complaint is mild dyspnea with activity, nonproductive cough.  No wheezing, sputum production, fevers, chills.  She is not on any controller inhalers She had an episode of bronchitis several months ago which was treated with antibiotics and inhalers which she is temporarily  Pets: No pets Occupation: Used to work at [employer] Exposures: No known exposures, no mold, hot tub, Jacuzzi Smoking history: 25-pack-year smoking.  Continues to smoke half pack per day Travel history: Grew up in [place].  No other significant travel Relevant family history: No significant history of lung issues  Outpatient Encounter Medications as of 09/20/2017  Medication Sig  . acetaminophen (TYLENOL) 500 MG tablet Take 2 tablets (1,000 mg total) by mouth every 8 (eight) hours.  Marland Kitchen aspirin 81 MG chewable tablet Chew by mouth daily.  . benzonatate (TESSALON) 100 MG capsule Take 100 mg by mouth 2 (two) times daily as needed for cough.   . Cholecalciferol (VITAMIN D3) 2000 UNITS TABS Take 2,000 Units by mouth daily.   Marland Kitchen ezetimibe (ZETIA) 10 MG tablet Take 10 mg by mouth daily.  . furosemide (LASIX) 40 MG tablet Take 40 mg by mouth daily as needed for fluid.   Marland Kitchen glimepiride (AMARYL) 1 MG tablet Take 1 mg by mouth as needed.  . Liraglutide (VICTOZA) 18 MG/3ML SOPN Inject 0.6 mg into the skin daily.   . metFORMIN (GLUCOPHAGE) 500 MG tablet Take 500 mg by mouth 2 (two) times daily with a meal.  . Multiple Vitamin (MULTIVITAMIN) tablet Take 1  tablet by mouth daily. Reported on 07/21/2015  . nicotine (NICODERM CQ - DOSED IN MG/24 HOURS) 14 mg/24hr patch Place 1 patch (14 mg total) onto the skin daily.  . ondansetron (ZOFRAN) 4 MG tablet Take 1 tablet (4 mg total) by mouth every 6 (six) hours as needed for nausea.  . polyethylene glycol (MIRALAX / GLYCOLAX) packet Take 17 g by mouth 2 (two) times daily.  . valsartan-hydrochlorothiazide (DIOVAN-HCT) 80-12.5 MG per tablet Take 1 tablet by mouth daily.  . vitamin E 1000 UNIT capsule Take 400 Units by mouth 2 (two) times daily.   . [DISCONTINUED] calcium carbonate (TUMS - DOSED IN MG ELEMENTAL CALCIUM) 500 MG chewable tablet Chew 3 tablets by mouth daily as needed for indigestion or heartburn.  . [DISCONTINUED] docusate sodium (COLACE) 100 MG capsule Take 1 capsule (100 mg total) by mouth 2 (two) times daily.  . [DISCONTINUED] ferrous sulfate 325 (65 FE) MG tablet Take 1 tablet (325 mg total) by mouth 3 (three) times daily after meals.  . rivaroxaban (XARELTO) 10 MG TABS tablet Take 1 tablet (10 mg total) by mouth daily. Take for 14 days, then start aspirin.  . [DISCONTINUED] methocarbamol (ROBAXIN) 500 MG tablet Take 1 tablet (500 mg total) by mouth every 6 (six) hours as needed for muscle spasms. (Patient not taking: Reported on 09/20/2017)  . [DISCONTINUED] Omega-3 Fatty Acids (FISH OIL) 1000 MG CAPS Take 2 capsules by mouth daily.   . [DISCONTINUED] oxyCODONE (OXY IR/ROXICODONE) 5 MG immediate release tablet Take 1-3  tablets (5-15 mg total) by mouth every 4 (four) hours as needed for severe pain. (Patient not taking: Reported on 09/20/2017)   Facility-Administered Encounter Medications as of 09/20/2017  Medication  . 0.9 %  sodium chloride infusion    Allergies as of 09/20/2017  . (No Known Allergies)    Past Medical History:  Diagnosis Date  . Anal or rectal pain    per pt / had 3 times  in last 3 years  . Anemia    as a child   . Arthritis   . DDD (degenerative disc disease),  lumbosacral    l4-5, S1  . Diabetes mellitus ([place])    type II   . Dyspnea    with exertion   . Edema    lower extremities   . GERD (gastroesophageal reflux disease)    in past  . History of kidney stones   . Hyperlipidemia   . Hypertension   . Low back pain   . Spinal stenosis     Past Surgical History:  Procedure Laterality Date  . BREAST REDUCTION SURGERY  12/2002   Bil  . KNEE SURGERY    . LUMBAR EPIDURAL INJECTION     In Dec 2018,had 4 injection  . REDUCTION MAMMAPLASTY Bilateral 15 years ago  . thumb surgery  1990   right thumb  . 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 underarm surgery  1980's   gland under right arm got infected due to shaving    Family History  Problem Relation Age of Onset  . Colon cancer Neg Hx     Social History   Socioeconomic History  . Marital status: Single    Spouse name: Not on file  . Number of children: Not on file  . Years of education: Not on file  . Highest education level: Not on file  Occupational History  . Not on file  Social Needs  . Financial resource strain: Not on file  . Food insecurity:    Worry: Not on file    Inability: Not on file  . Transportation needs:    Medical: Not on file    Non-medical: Not on file  Tobacco Use  . Smoking status: Current Every Day Smoker    Packs/day: 0.50    Types: Cigarettes  . Smokeless tobacco: Never Used  Substance and Sexual Activity  . Alcohol use: No  . Drug use: No  . Sexual activity: Not Currently    Birth control/protection: Post-menopausal  Lifestyle  . Physical activity:    Days per week: Not on file    Minutes per session: Not on file  . Stress: Not on file  Relationships  . Social connections:    Talks on phone: Not on file    Gets together: Not on file    Attends religious service: Not on  file    Active member of club or organization: Not on file    Attends meetings of clubs or organizations: Not on file    Relationship status: Not on file  . Intimate partner violence:    Fear of current or ex partner: Not on file    Emotionally abused: Not on file    Physically abused: Not on file    Forced sexual activity: Not on file  Other Topics Concern  . Not on file  Social History Narrative  . Not on file    Review of systems: Review of Systems  Constitutional: Negative for fever and chills.  HENT: Negative.   Eyes: Negative for blurred vision.  Respiratory: as per HPI  Cardiovascular: Negative for chest pain and palpitations.  Gastrointestinal: Negative for vomiting, diarrhea, blood per rectum. Genitourinary: Negative for dysuria, urgency, frequency and hematuria.  Musculoskeletal: Negative for myalgias, back pain and joint pain.  Skin: Negative for itching and rash.  Neurological: Negative for dizziness, tremors, focal weakness, seizures and loss of consciousness.  Endo/Heme/Allergies: Negative for environmental allergies.  Psychiatric/Behavioral: Negative for depression, suicidal ideas and hallucinations.  All other systems reviewed and are negative.  Physical Exam: Blood pressure 134/80, pulse 90, height 5\' 6"  (1.676 m), weight 257 lb 6.4 oz (116.8 kg), SpO2 95 %. Gen:      No acute distress HEENT:  EOMI, sclera anicteric Neck:     No masses; no thyromegaly Lungs:    Clear to auscultation bilaterally; normal respiratory effort CV:         Regular rate and rhythm; no murmurs Abd:      + bowel sounds; soft, non-tender; no palpable masses, no distension Ext:    No edema; adequate peripheral perfusion Skin:      Warm and dry; no rash Neuro: alert and oriented x 3 Psych: normal mood and affect  Data Reviewed: CT chest 08/16/2017- mild centrilobular and paraseptal emphysema.  Mild scarring in the right middle lobe, lingula, lower lobes.  4 mm right upper lobe  nodule. I have reviewed the images personally  Assessment:  Emphysema CT scan reviewed with patient which showes mild emphysematous changes.   We will get pulmonary function test for better evaluation She is asymptomatic and does not need any inhalers at present  Active smoker Prescribe nicotine patches to help with quitting.  She is already been prescribed Wellbutrin by her primary care and will start this with a nicotine patches. Time spent counseling-5 minutes  Plan/Recommendations: - Pulmonary function test - Nicotine patches.  Start Wellbutrin.  Marshell Garfinkel MD [facility] 09/20/2017, 2:48 PM  CC: Jonathon Jordan, MD

## 2017-09-20 NOTE — Patient Instructions (Signed)
We will start you on nicotine patches.  Start Wellbutrin that is already given by primary care to help with smoking cessation His CT scan shows emphysema, COPD.  We will get pulmonary function test for further evaluation Follow-up in 1 to 2 months.

## 2017-10-12 DIAGNOSIS — J029 Acute pharyngitis, unspecified: Secondary | ICD-10-CM | POA: Diagnosis not present

## 2017-10-21 ENCOUNTER — Other Ambulatory Visit: Payer: Self-pay | Admitting: Pulmonary Disease

## 2017-10-29 ENCOUNTER — Ambulatory Visit (INDEPENDENT_AMBULATORY_CARE_PROVIDER_SITE_OTHER): Payer: PPO | Admitting: Pulmonary Disease

## 2017-10-29 ENCOUNTER — Encounter: Payer: Self-pay | Admitting: Pulmonary Disease

## 2017-10-29 DIAGNOSIS — F17210 Nicotine dependence, cigarettes, uncomplicated: Secondary | ICD-10-CM | POA: Diagnosis not present

## 2017-10-29 DIAGNOSIS — R0602 Shortness of breath: Secondary | ICD-10-CM

## 2017-10-29 DIAGNOSIS — R918 Other nonspecific abnormal finding of lung field: Secondary | ICD-10-CM | POA: Diagnosis not present

## 2017-10-29 LAB — PULMONARY FUNCTION TEST
DL/VA % pred: 65 %
DL/VA: 3.32 ml/min/mmHg/L
DLCO UNC % PRED: 50 %
DLCO UNC: 13.53 ml/min/mmHg
FEF 25-75 PRE: 1.22 L/s
FEF 25-75 Post: 1.04 L/sec
FEF2575-%Change-Post: -15 %
FEF2575-%Pred-Post: 51 %
FEF2575-%Pred-Pre: 60 %
FEV1-%Change-Post: -3 %
FEV1-%PRED-POST: 76 %
FEV1-%Pred-Pre: 79 %
FEV1-Post: 1.63 L
FEV1-Pre: 1.69 L
FEV1FVC-%CHANGE-POST: 0 %
FEV1FVC-%PRED-PRE: 96 %
FEV6-%CHANGE-POST: -2 %
FEV6-%PRED-PRE: 83 %
FEV6-%Pred-Post: 81 %
FEV6-Post: 2.15 L
FEV6-Pre: 2.21 L
FEV6FVC-%CHANGE-POST: 0 %
FEV6FVC-%PRED-POST: 103 %
FEV6FVC-%PRED-PRE: 102 %
FVC-%CHANGE-POST: -3 %
FVC-%Pred-Post: 79 %
FVC-%Pred-Pre: 81 %
FVC-Post: 2.17 L
FVC-Pre: 2.24 L
POST FEV6/FVC RATIO: 99 %
PRE FEV1/FVC RATIO: 76 %
Post FEV1/FVC ratio: 75 %
Pre FEV6/FVC Ratio: 99 %
RV % PRED: 102 %
RV: 2.25 L
TLC % PRED: 84 %
TLC: 4.54 L

## 2017-10-29 NOTE — Progress Notes (Signed)
Stephanie Fox    [id number]    05/09/1950  Primary Care Physician:Wolters, Ivin Booty, MD  Referring Physician: Jonathon Jordan, MD [place], [place] [address]  Chief complaint: Consult for emphysema, pulmonary nodule  HPI: 67 year old with history of hypertension, diabetes, hyperlipidemia, active smoker She had a screening CT scan of the chest which showed emphysema and pulmonary nodule and has been referred here for further evaluation Complaint is mild dyspnea with activity, nonproductive cough.  No wheezing, sputum production, fevers, chills.  She is not on any controller inhalers She had an episode of bronchitis several months ago which was treated with antibiotics and inhalers which she is temporarily  Pets: No pets Occupation: Used to work at [employer] Exposures: No known exposures, no mold, hot tub, Jacuzzi Smoking history: 25-pack-year smoking.  Continues to smoke half pack per day Travel history: Grew up in [place].  No other significant travel Relevant family history: No significant history of lung issues  Interim history: She is here for review of PFTs.  States that she has very mild dyspnea on exertion while speed walking, climbing up steps.  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  Outpatient Encounter Medications as of 10/29/2017  Medication Sig  . acetaminophen (TYLENOL) 500 MG tablet Take 2 tablets (1,000 mg total) by mouth every 8 (eight) hours. (Patient taking differently: Take 1,000 mg by mouth every 8 (eight) hours as needed. )  . aspirin 81 MG chewable tablet Chew by mouth daily.  . benzonatate (TESSALON) 100 MG capsule Take 100 mg by mouth 2 (two) times daily as needed for cough.   . Cholecalciferol (VITAMIN D3) 2000 UNITS TABS Take 2,000 Units by mouth daily.   Marland Kitchen ezetimibe (ZETIA) 10 MG tablet Take 10 mg by mouth daily.  . furosemide (LASIX) 40 MG tablet Take 40 mg by mouth daily as  needed for fluid.   Marland Kitchen glimepiride (AMARYL) 1 MG tablet Take 1 mg by mouth as needed.  . Liraglutide (VICTOZA) 18 MG/3ML SOPN Inject 0.6 mg into the skin daily.   . metFORMIN (GLUCOPHAGE) 500 MG tablet Take 500 mg by mouth 2 (two) times daily with a meal.  . Multiple Vitamin (MULTIVITAMIN) tablet Take 1 tablet by mouth daily. Reported on 07/21/2015  . Nicotine 21-14-7 MG/24HR KIT USE AS DIRECTED  . polyethylene glycol (MIRALAX / GLYCOLAX) packet Take 17 g by mouth 2 (two) times daily.  . valsartan-hydrochlorothiazide (DIOVAN-HCT) 80-12.5 MG per tablet Take 1 tablet by mouth daily.  . vitamin E 1000 UNIT capsule Take 400 Units by mouth 2 (two) times daily.   . [DISCONTINUED] nicotine (NICODERM CQ - DOSED IN MG/24 HOURS) 14 mg/24hr patch Place 1 patch (14 mg total) onto the skin daily.  . [DISCONTINUED] ondansetron (ZOFRAN) 4 MG tablet Take 1 tablet (4 mg total) by mouth every 6 (six) hours as needed for nausea.  . [DISCONTINUED] rivaroxaban (XARELTO) 10 MG TABS tablet Take 1 tablet (10 mg total) by mouth daily. Take for 14 days, then start aspirin.   Facility-Administered Encounter Medications as of 10/29/2017  Medication  . 0.9 %  sodium chloride infusion    Allergies as of 10/29/2017  . (No Known Allergies)    Past Medical History:  Diagnosis Date  . Anal or rectal pain    per pt / had 3 times  in last 3 years  . Anemia    as a  child   . Arthritis   . DDD (degenerative disc disease), lumbosacral    l4-5, S1  . Diabetes mellitus ([place])    type II   . Dyspnea    with exertion   . Edema    lower extremities   . GERD (gastroesophageal reflux disease)    in past  . History of kidney stones   . Hyperlipidemia   . Hypertension   . Low back pain   . Spinal stenosis     Past Surgical History:  Procedure Laterality Date  . BREAST REDUCTION SURGERY  12/2002   Bil  . KNEE SURGERY    . LUMBAR EPIDURAL INJECTION     In Dec 2018,had 4 injection  . REDUCTION MAMMAPLASTY Bilateral 15  years ago  . thumb surgery  1990   right thumb  . 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 underarm surgery  1980's   gland under right arm got infected due to shaving    Family History  Problem Relation Age of Onset  . Colon cancer Neg Hx     Social History   Socioeconomic History  . Marital status: Single    Spouse name: Not on file  . Number of children: Not on file  . Years of education: Not on file  . Highest education level: Not on file  Occupational History  . Not on file  Social Needs  . Financial resource strain: Not on file  . Food insecurity:    Worry: Not on file    Inability: Not on file  . Transportation needs:    Medical: Not on file    Non-medical: Not on file  Tobacco Use  . Smoking status: Current Every Day Smoker    Packs/day: 0.50    Types: Cigarettes  . Smokeless tobacco: Never Used  Substance and Sexual Activity  . Alcohol use: No  . Drug use: No  . Sexual activity: Not Currently    Birth control/protection: Post-menopausal  Lifestyle  . Physical activity:    Days per week: Not on file    Minutes per session: Not on file  . Stress: Not on file  Relationships  . Social connections:    Talks on phone: Not on file    Gets together: Not on file    Attends religious service: Not on file    Active member of club or organization: Not on file    Attends meetings of clubs or organizations: Not on file    Relationship status: Not on file  . Intimate partner violence:    Fear of current or ex partner: Not on file    Emotionally abused: Not on file    Physically abused: Not on file    Forced sexual activity: Not on file  Other Topics Concern  . Not on file  Social History Narrative  . Not on file    Review of systems: Review of Systems  Constitutional: Negative for fever and  chills.  HENT: Negative.   Eyes: Negative for blurred vision.  Respiratory: as per HPI  Cardiovascular: Negative for chest pain and palpitations.  Gastrointestinal: Negative for vomiting, diarrhea, blood per rectum. Genitourinary: Negative for dysuria, urgency, frequency and hematuria.  Musculoskeletal: Negative for myalgias, back pain and joint pain.  Skin: Negative for itching and rash.  Neurological: Negative for dizziness, tremors, focal  weakness, seizures and loss of consciousness.  Endo/Heme/Allergies: Negative for environmental allergies.  Psychiatric/Behavioral: Negative for depression, suicidal ideas and hallucinations.  All other systems reviewed and are negative.  Physical Exam: Blood pressure 130/72, pulse 82, height '5\' 6"'  (1.676 m), weight 256 lb (116.1 kg), SpO2 94 %. Gen:      No acute distress HEENT:  EOMI, sclera anicteric Neck:     No masses; no thyromegaly Lungs:    Clear to auscultation bilaterally; normal respiratory effort CV:         Regular rate and rhythm; no murmurs Abd:      + bowel sounds; soft, non-tender; no palpable masses, no distension Ext:    No edema; adequate peripheral perfusion Skin:      Warm and dry; no rash Neuro: alert and oriented x 3 Psych: normal mood and affect  Data Reviewed: CT chest 08/16/2017- mild centrilobular and paraseptal emphysema.  Mild scarring in the right middle lobe, lingula, lower lobes.  4 mm right upper lobe nodule. I have reviewed the images personally  PFTs 10/29/2017 FVC 2.17 [79%], FEV1 1.63 [76%], F/F 75, TLC 84%, DLCO 50% Minimal obstruction, moderate-severe diffusion defect  Assessment:  Emphysema, COPD CT scan reviewed with patient which showes mild emphysematous changes.  PFTs show no overt obstruction but there is curvature to the flow loop and reduction in mid flow rate suggestive of minimal, small airway disease She is currently asymptomatic and we will observe off inhalers.   Lung nodule Follow-up CT  without contrast in April 2020  Active smoker Currently on nicotine patches and Wellbutrin.  She is reducing her smoking Discussed PFT results and explained that her COPD would likely worsen if she does not quit.  Reassess at next visit Time spent counseling-5 minutes  Health maintenance 02/04/2017-influenza 08/07/2017-Prevnar 04/30/2013-Pneumovax  Plan/Recommendations: - Work on smoking cessation.  Continue nicotine patches, Wellbutrin - CT without contrast follow-up  Marshell Garfinkel MD [facility] 10/29/2017, 2:40 PM  CC: Jonathon Jordan, MD

## 2017-10-29 NOTE — Patient Instructions (Signed)
Continue to work on smoking cessation We will make sure that the CT scan without contrast is ordered for April 2020 for follow-up of lung nodules Follow-up after CT scan for review.

## 2017-10-29 NOTE — Progress Notes (Signed)
PFT done today. 

## 2017-12-05 ENCOUNTER — Observation Stay (HOSPITAL_BASED_OUTPATIENT_CLINIC_OR_DEPARTMENT_OTHER)
Admission: EM | Admit: 2017-12-05 | Discharge: 2017-12-06 | Disposition: A | Discharge status: Home / Self Care | Payer: PPO | Attending: Family Medicine | Admitting: Family Medicine

## 2017-12-05 ENCOUNTER — Encounter (HOSPITAL_BASED_OUTPATIENT_CLINIC_OR_DEPARTMENT_OTHER): Payer: Self-pay

## 2017-12-05 ENCOUNTER — Emergency Department (HOSPITAL_COMMUNITY): Payer: PPO

## 2017-12-05 ENCOUNTER — Other Ambulatory Visit: Payer: Self-pay

## 2017-12-05 ENCOUNTER — Emergency Department (HOSPITAL_BASED_OUTPATIENT_CLINIC_OR_DEPARTMENT_OTHER): Payer: PPO

## 2017-12-05 DIAGNOSIS — Z7982 Long term (current) use of aspirin: Secondary | ICD-10-CM | POA: Diagnosis not present

## 2017-12-05 DIAGNOSIS — M1990 Unspecified osteoarthritis, unspecified site: Secondary | ICD-10-CM | POA: Insufficient documentation

## 2017-12-05 DIAGNOSIS — Z87442 Personal history of urinary calculi: Secondary | ICD-10-CM | POA: Insufficient documentation

## 2017-12-05 DIAGNOSIS — D259 Leiomyoma of uterus, unspecified: Secondary | ICD-10-CM | POA: Diagnosis not present

## 2017-12-05 DIAGNOSIS — K7689 Other specified diseases of liver: Principal | ICD-10-CM | POA: Insufficient documentation

## 2017-12-05 DIAGNOSIS — E7800 Pure hypercholesterolemia, unspecified: Secondary | ICD-10-CM | POA: Insufficient documentation

## 2017-12-05 DIAGNOSIS — R0600 Dyspnea, unspecified: Secondary | ICD-10-CM | POA: Diagnosis not present

## 2017-12-05 DIAGNOSIS — M5137 Other intervertebral disc degeneration, lumbosacral region: Secondary | ICD-10-CM | POA: Insufficient documentation

## 2017-12-05 DIAGNOSIS — Z96653 Presence of artificial knee joint, bilateral: Secondary | ICD-10-CM | POA: Insufficient documentation

## 2017-12-05 DIAGNOSIS — I10 Essential (primary) hypertension: Secondary | ICD-10-CM | POA: Insufficient documentation

## 2017-12-05 DIAGNOSIS — Z794 Long term (current) use of insulin: Secondary | ICD-10-CM | POA: Insufficient documentation

## 2017-12-05 DIAGNOSIS — D649 Anemia, unspecified: Secondary | ICD-10-CM | POA: Insufficient documentation

## 2017-12-05 DIAGNOSIS — F17210 Nicotine dependence, cigarettes, uncomplicated: Secondary | ICD-10-CM | POA: Diagnosis not present

## 2017-12-05 DIAGNOSIS — Z79899 Other long term (current) drug therapy: Secondary | ICD-10-CM | POA: Insufficient documentation

## 2017-12-05 DIAGNOSIS — M545 Low back pain: Secondary | ICD-10-CM | POA: Diagnosis not present

## 2017-12-05 DIAGNOSIS — K219 Gastro-esophageal reflux disease without esophagitis: Secondary | ICD-10-CM | POA: Insufficient documentation

## 2017-12-05 DIAGNOSIS — E785 Hyperlipidemia, unspecified: Secondary | ICD-10-CM | POA: Diagnosis not present

## 2017-12-05 DIAGNOSIS — I700 Atherosclerosis of aorta: Secondary | ICD-10-CM | POA: Insufficient documentation

## 2017-12-05 DIAGNOSIS — I2510 Atherosclerotic heart disease of native coronary artery without angina pectoris: Secondary | ICD-10-CM | POA: Insufficient documentation

## 2017-12-05 DIAGNOSIS — R1013 Epigastric pain: Secondary | ICD-10-CM | POA: Diagnosis not present

## 2017-12-05 DIAGNOSIS — K8020 Calculus of gallbladder without cholecystitis without obstruction: Secondary | ICD-10-CM | POA: Insufficient documentation

## 2017-12-05 DIAGNOSIS — E119 Type 2 diabetes mellitus without complications: Secondary | ICD-10-CM | POA: Insufficient documentation

## 2017-12-05 DIAGNOSIS — R600 Localized edema: Secondary | ICD-10-CM | POA: Insufficient documentation

## 2017-12-05 DIAGNOSIS — R1011 Right upper quadrant pain: Secondary | ICD-10-CM | POA: Diagnosis not present

## 2017-12-05 DIAGNOSIS — K769 Liver disease, unspecified: Secondary | ICD-10-CM | POA: Diagnosis not present

## 2017-12-05 LAB — COMPREHENSIVE METABOLIC PANEL
ALBUMIN: 3.4 g/dL — AB (ref 3.5–5.0)
ALK PHOS: 69 U/L (ref 38–126)
ALT: 19 U/L (ref 0–44)
ANION GAP: 7 (ref 5–15)
AST: 15 U/L (ref 15–41)
BILIRUBIN TOTAL: 0.6 mg/dL (ref 0.3–1.2)
BUN: 13 mg/dL (ref 8–23)
CALCIUM: 9.2 mg/dL (ref 8.9–10.3)
CO2: 29 mmol/L (ref 22–32)
Chloride: 99 mmol/L (ref 98–111)
Creatinine, Ser: 0.77 mg/dL (ref 0.44–1.00)
GFR calc Af Amer: >60 mL/min (ref >60)
GFR calc non Af Amer: >60 mL/min (ref >60)
GLUCOSE: 117 mg/dL — AB (ref 70–99)
Potassium: 3.7 mmol/L (ref 3.5–5.1)
Sodium: 135 mmol/L (ref 135–145)
TOTAL PROTEIN: 7.1 g/dL (ref 6.5–8.1)

## 2017-12-05 LAB — CBG MONITORING, ED
GLUCOSE-CAPILLARY: 107 mg/dL — AB (ref 70–99)
Glucose-Capillary: 92 mg/dL (ref 70–99)
Glucose-Capillary: 111 mg/dL — ABNORMAL HIGH (ref 70–99)

## 2017-12-05 LAB — CBC WITH DIFFERENTIAL/PLATELET
BASOS PCT: 0 %
Basophils Absolute: 0 10*3/uL (ref 0.0–0.1)
Eosinophils Absolute: 0.1 10*3/uL (ref 0.0–0.7)
Eosinophils Relative: 1 %
HEMATOCRIT: 37.4 % (ref 36.0–46.0)
HEMOGLOBIN: 12.9 g/dL (ref 12.0–15.0)
LYMPHS ABS: 2.4 10*3/uL (ref 0.7–4.0)
LYMPHS PCT: 20 %
MCH: 30.8 pg (ref 26.0–34.0)
MCHC: 34.5 g/dL (ref 30.0–36.0)
MCV: 89.3 fL (ref 78.0–100.0)
MONOS PCT: 10 %
Monocytes Absolute: 1.2 10*3/uL — ABNORMAL HIGH (ref 0.1–1.0)
NEUTROS ABS: 8.5 10*3/uL — AB (ref 1.7–7.7)
NEUTROS PCT: 69 %
Platelets: 311 10*3/uL (ref 150–400)
RBC: 4.19 MIL/uL (ref 3.87–5.11)
RDW: 14.3 % (ref 11.5–15.5)
WBC: 12.2 10*3/uL — ABNORMAL HIGH (ref 4.0–10.5)

## 2017-12-05 LAB — LIPASE, BLOOD: Lipase: 72 U/L — ABNORMAL HIGH (ref 11–51)

## 2017-12-05 MED ORDER — ONDANSETRON HCL 4 MG/2ML IJ SOLN
4.0000 mg | Freq: Once | INTRAMUSCULAR | Status: AC
  Administered 2017-12-05: 4 mg via INTRAVENOUS
  Filled 2017-12-05: qty 2

## 2017-12-05 MED ORDER — HYDROMORPHONE HCL 1 MG/ML IJ SOLN
0.5000 mg | Freq: Once | INTRAMUSCULAR | Status: AC
  Administered 2017-12-05: 0.5 mg via INTRAVENOUS
  Filled 2017-12-05: qty 1

## 2017-12-05 MED ORDER — IOPAMIDOL (ISOVUE-300) INJECTION 61%
100.0000 mL | Freq: Once | INTRAVENOUS | Status: AC | PRN
  Administered 2017-12-05: 100 mL via INTRAVENOUS

## 2017-12-05 MED ORDER — SODIUM CHLORIDE 0.9 % IV SOLN: INTRAVENOUS | Status: DC

## 2017-12-05 MED ORDER — HYDROMORPHONE HCL 1 MG/ML IJ SOLN
1.0000 mg | Freq: Once | INTRAMUSCULAR | Status: AC
  Administered 2017-12-05: 1 mg via INTRAVENOUS
  Filled 2017-12-05: qty 1

## 2017-12-05 MED ORDER — SODIUM CHLORIDE 0.9 % IV BOLUS
1000.0000 mL | Freq: Once | INTRAVENOUS | Status: AC
  Administered 2017-12-05: 1000 mL via INTRAVENOUS

## 2017-12-05 MED ORDER — GADOBENATE DIMEGLUMINE 529 MG/ML IV SOLN
20.0000 mL | Freq: Once | INTRAVENOUS | Status: AC | PRN
  Administered 2017-12-05: 20 mL via INTRAVENOUS

## 2017-12-05 NOTE — ED Provider Notes (Signed)
[facility] DEPARTMENT Provider Note   CSN: [id number] Arrival date & time: 12/05/17  1352     History   Chief Complaint Chief Complaint  Patient presents with  . Abdominal Pain    HPI Stephanie Fox is a 67 y.o. female.  67 year old female history of non-insulin-dependent diabetes, high blood pressure, high cholesterol, anemia presents with complaint of right upper quadrant abdominal pain.  Patient states that she had epigastric pain 2 weeks ago after using a rowing machine, attributed the pain to the pad on the machine that sits against her chest/abdomen, was tender in the epigastric area but this resolved after a few days. Patient states pain returned last night, located predominantly in the RUQ, radiates to RLQ and to her right shoulder. Pain is worse with palpation and deep breathing. Denies nausea, vomiting, changes in bowel or bladder habits (last bowel movement was yesterday, normal), fevers, chills. No prior abdominal surgeries. No other complaints or concerns. Patient went to urgent care before coming to the ER today, normal EKG, sent to ER for further evaluation.     Past Medical History:  Diagnosis Date  . Anal or rectal pain    per pt / had 3 times  in last 3 years  . Anemia    as a child   . Arthritis   . DDD (degenerative disc disease), lumbosacral    l4-5, S1  . Diabetes mellitus ([place])    type II   . Dyspnea    with exertion   . Edema    lower extremities   . GERD (gastroesophageal reflux disease)    in past  . History of kidney stones   . Hyperlipidemia   . Hypertension   . Low back pain   . Spinal stenosis     Patient Active Problem List   Diagnosis Date Noted  . 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 S/P knee replacement 04/06/2016  . RUQ abdominal pain 01/09/2013    Past Surgical History:  Procedure Laterality Date  . BREAST REDUCTION SURGERY  12/2002   Bil  . KNEE SURGERY    . LUMBAR EPIDURAL INJECTION     In Dec 2018,had 4  injection  . REDUCTION MAMMAPLASTY Bilateral 15 years ago  . thumb surgery  1990   right thumb  . 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 underarm surgery  1980's   gland under right arm got infected due to shaving     OB History    Gravida  2   Para  0   Term  0   Preterm  0   AB  2   Living  0     SAB  0   TAB  2   Ectopic  0   Multiple  0   Live Births               Home Medications    Prior to Admission medications   Medication Sig Start Date End Date Taking? Authorizing Provider  acetaminophen (TYLENOL) 500 MG tablet Take 2 tablets (1,000 mg total) by mouth every 8 (eight) hours. Patient taking differently: Take 1,000 mg by mouth every 8 (eight) hours as needed.  04/09/16   Danae Orleans, PA-C  aspirin 81 MG chewable tablet Chew by mouth daily.    [provider]  benzonatate Lavella Lemons)  100 MG capsule Take 100 mg by mouth 2 (two) times daily as needed for cough.  01/27/12   [provider]  Cholecalciferol (VITAMIN D3) 2000 UNITS TABS Take 2,000 Units by mouth daily.     [provider]  ezetimibe (ZETIA) 10 MG tablet Take 10 mg by mouth daily. 07/01/17   [provider]  furosemide (LASIX) 40 MG tablet Take 40 mg by mouth daily as needed for fluid.     [provider]  glimepiride (AMARYL) 1 MG tablet Take 1 mg by mouth as needed.    [provider]  Liraglutide (VICTOZA) 18 MG/3ML SOPN Inject 0.6 mg into the skin daily.     [provider]  metFORMIN (GLUCOPHAGE) 500 MG tablet Take 500 mg by mouth 2 (two) times daily with a meal.    [provider]  Multiple Vitamin (MULTIVITAMIN) tablet Take 1 tablet by mouth daily. Reported on 07/21/2015    [provider]  Nicotine 21-14-7 MG/24HR KIT USE AS DIRECTED 10/21/17    Mannam, Praveen, MD  polyethylene glycol (MIRALAX / GLYCOLAX) packet Take 17 g by mouth 2 (two) times daily. 04/09/16   Danae Orleans, PA-C  valsartan-hydrochlorothiazide (DIOVAN-HCT) 80-12.5 MG per tablet Take 1 tablet by mouth daily.    [provider]  vitamin E 1000 UNIT capsule Take 400 Units by mouth 2 (two) times daily.     [provider]    Family History Family History  Problem Relation Age of Onset  . Colon cancer Neg Hx     Social History Social History   Tobacco Use  . Smoking status: Current Every Day Smoker    Packs/day: 0.50    Types: Cigarettes  . Smokeless tobacco: Never Used  Substance Use Topics  . Alcohol use: No  . Drug use: No     Allergies   Patient has no known allergies.   Review of Systems Review of Systems  Constitutional: Negative for chills and fever.  Respiratory: Negative for chest tightness and shortness of breath.   Cardiovascular: Negative for chest pain.  Gastrointestinal: Positive for abdominal pain. Negative for abdominal distention, blood in stool, constipation, diarrhea, nausea and vomiting.  Genitourinary: Negative for dysuria, frequency and urgency.  Musculoskeletal: Positive for arthralgias. Negative for back pain.  Skin: Negative for rash and wound.  Allergic/Immunologic: Positive for immunocompromised state.  Neurological: Negative for weakness and headaches.  Hematological: Does not bruise/bleed easily.  Psychiatric/Behavioral: Negative for confusion.  All other systems reviewed and are negative.    Physical Exam Updated Vital Signs BP 117/69   Pulse 89   Temp 97.8 F (36.6 C) (Oral)   Resp 16   Ht _0  (1.676 m)   Wt 114.3 kg   SpO2 98%   BMI 40.67 kg/m   Physical Exam  Constitutional: She is oriented to person, place, and time. She appears well-developed and well-nourished. No distress.  HENT:  Head: Normocephalic and atraumatic.  Eyes: No scleral icterus.  Cardiovascular: Normal  rate, regular rhythm, normal heart sounds and intact distal pulses.  Pulmonary/Chest: Effort normal and breath sounds normal.  Abdominal: Normal appearance and bowel sounds are normal. There is tenderness in the right upper quadrant and epigastric area. There is guarding. There is no CVA tenderness.  Neurological: She is alert and oriented to person, place, and time.  Skin: Skin is warm and dry. She is not diaphoretic.  Psychiatric: She has a normal mood and affect. Her behavior is normal.  Nursing  note and vitals reviewed.    ED Treatments / Results  Labs (all labs ordered are listed, but only abnormal results are displayed) Labs Reviewed  CBC WITH DIFFERENTIAL/PLATELET - Abnormal; Notable for the following components:      Result Value   WBC 12.2 (*)    Neutro Abs 8.5 (*)    Monocytes Absolute 1.2 (*)    All other components within normal limits  COMPREHENSIVE METABOLIC PANEL - Abnormal; Notable for the following components:   Glucose, Bld 117 (*)    Albumin 3.4 (*)    All other components within normal limits  LIPASE, BLOOD - Abnormal; Notable for the following components:   Lipase 72 (*)    All other components within normal limits  URINALYSIS, ROUTINE W REFLEX MICROSCOPIC  COMPREHENSIVE METABOLIC PANEL  LIPASE, BLOOD    EKG EKG Interpretation  Date/Time:  Thursday December 05 2017 14:54:06 EDT Ventricular Rate:  95 PR Interval:    QRS Duration: 90 QT Interval:  399 QTC Calculation: 502 R Axis:   47 Text Interpretation:  Sinus rhythm RSR' in V1 or V2, right VCD or RVH Prolonged QT interval Baseline wander in lead(s) V1 When compared to prior, longer QTc No STEMI Confirmed by Antony Blackbird [phone]) on 12/05/2017 3:02:13 PM   Radiology Ct Abdomen Pelvis W Contrast  Result Date: 12/05/2017 CLINICAL DATA:  Right upper quadrant pain EXAM: CT ABDOMEN AND PELVIS WITH CONTRAST TECHNIQUE: Multidetector CT imaging of the abdomen and pelvis was performed using the standard  protocol following bolus administration of intravenous contrast. CONTRAST:  142m ISOVUE-300 IOPAMIDOL (ISOVUE-300) INJECTION 61% COMPARISON:  Ultrasound 01/13/2013 FINDINGS: Lower chest: Bibasilar atelectasis. No effusions. Heart is mildly enlarged. Moderate coronary artery calcifications in the left coronary artery. Hepatobiliary: Numerous low-density lesions throughout the liver, most which appear to represent simple cysts. There is a 9.2 cm low-density lesion in the left hepatic lobe with mild surrounding stranding and slight increased density relative to the other cystic lesions. No biliary duct dilatation. Gallbladder unremarkable. Pancreas: No focal abnormality or ductal dilatation. Spleen: No focal abnormality.  Normal size. Adrenals/Urinary Tract: No adrenal abnormality. No focal renal abnormality. No stones or hydronephrosis. Urinary bladder is unremarkable. Stomach/Bowel: Normal appendix. Stomach, large and small bowel grossly unremarkable. Vascular/Lymphatic: Aortic atherosclerosis. No enlarged abdominal or pelvic lymph nodes. Reproductive: Calcified and partially calcified fibroids in the uterus. No adnexal mass. Other: No free fluid or free air. Musculoskeletal: No acute bony abnormality. IMPRESSION: Numerous cystic lesions within the liver. The largest is in the left hepatic lobe and extends exophytic off the left lobe of the liver with slight increased density in its relative to the other simple appearing cysts. There is mild surrounding stranding in the adjacent fat. This conceivably could represent hemorrhage into the cyst. This could be better characterized with liver protocol MRI if felt clinically indicated. Fibroid uterus. Aortic atherosclerosis, coronary artery disease. Bibasilar atelectasis. Mild cardiomegaly. Electronically Signed   By: KRolm BaptiseM.D.   On: 12/05/2017 17:41    Procedures Procedures (including critical care time)  Medications Ordered in ED Medications  sodium  chloride 0.9 % bolus 1,000 mL ( Intravenous Stopped 12/05/17 1612)    And  0.9 %  sodium chloride infusion (has no administration in time range)  HYDROmorphone (DILAUDID) injection 0.5 mg (0.5 mg Intravenous Given 12/05/17 1506)  ondansetron (ZOFRAN) injection 4 mg (4 mg Intravenous Given 12/05/17 1504)  HYDROmorphone (DILAUDID) injection 1 mg (1 mg Intravenous Given 12/05/17 1619)  iopamidol (ISOVUE-300) 61 %  injection 100 mL (100 mLs Intravenous Contrast Given 12/05/17 1714)     Initial Impression / Assessment and Plan / ED Course  I have reviewed the triage vital signs and the nursing notes.  Pertinent labs & imaging results that were available during my care of the patient were reviewed by me and considered in my medical decision making (see chart for details).  Clinical Course as of Dec 05 1901  Thu Dec 06, 3223  6133 67 year old female presents with complaint of epigastric and right upper quadrant pain onset last night.  On exam patient is notably tender in the right upper quadrant and epigastric area with guarding.  CT today with IV contrast shows a 9.2 cm cyst in the left hepatic lobe, numerous and cystic lesions within the liver.  Question of hemorrhage within the cyst, mild surrounding stranding in the adjacent fat.  CT report today was compared to a CT chest completed on April 26 as a screening CT which noted a 7.2 cm cyst in the liver at that time.   [LM]  6553 Case discussed with Dr. Benson Norway with Gi, recommends MRI liver as advised on CT report. Discussed with Dr. Sherry Ruffing, ER attending, who has seen the patient and recommends consult with Dr. Regenia Skeeter, ER attending at [facility]. Dr. Regenia Skeeter aware of patient's pending arrival.   [LM]  1902 Discussed results and plan of care with patient, she prefers to have a family member drive her via private vehicle to [facility] for her MRI tonight.  Patient's vitals are stable, H&H within normal limits.   [LM]    Clinical Course User Index [LM]  Tacy Learn, PA-C    Final Clinical Impressions(s) / ED Diagnoses   Final diagnoses:  Liver cyst  Right upper quadrant abdominal pain    ED Discharge Orders    None       Roque Lias 12/05/17 1903    Tegeler, Gwenyth Allegra, MD 12/06/17 1505

## 2017-12-05 NOTE — ED Notes (Signed)
IV secured for transport. Pt instructed not to tamper with IV. Pt instructed to remain NPO and not to make any additional stops on the way to [facility]. Ensured family knows how to get to ED at [facility].

## 2017-12-05 NOTE — ED Provider Notes (Signed)
8:43 PM Patient transferred to emergency department from [facility].  Patient with right upper quadrant abdominal pain for 2 weeks.  States initially thought it was an injury from the gym, however last night, pain became 8 more severe and she is unable to sleep.  Patient reports associated fever up to 100.  Denies any nausea or vomiting.  Was seen in emergency department and had a CT scan of abdomen pelvis which showed numerous cystic lesions within the liver.  There was some surrounding stranding of adjacent fat, there was a question of a possible hemorrhagic cyst.  MRI protocol of the liver was recommended.  Prior provider discussed these findings with Dr. Benson Norway with gastroenterology who recommended the MRI of the liver.  Patient was transferred here for MRI.  Patient states pain is not as severe at this time.  She thinks her blood sugar is dropping however because she has not had anything to eat all day.  We discussed obtaining MRI, will check her sugar.  We will monitor her.  CBG 107.   Patient waiting for MRI.  Signed out to PA Leaphart at shift change. deposition based on results.      Jeannett Senior, PA-C 12/05/17 2213    Sherwood Gambler, MD 12/05/17 2242

## 2017-12-05 NOTE — ED Notes (Signed)
CBG- 92 

## 2017-12-05 NOTE — ED Notes (Signed)
Pt back from MRI. Requesting that we check her CBG and asking to be allowed to eat.

## 2017-12-05 NOTE — ED Notes (Signed)
Patient transported to CT 

## 2017-12-05 NOTE — ED Notes (Signed)
Patient is awre we need a urine sample. Pt is unable to give urine at this time.

## 2017-12-05 NOTE — Discharge Instructions (Signed)
Go directly to [facility], transfer for liver protocol MRI. Do NOT eat or drink anything until testing is complete and results/plan given.

## 2017-12-05 NOTE — ED Triage Notes (Signed)
C/o right side abd and shoulder pain x 2 days-denies v/d-to triage in w/c-states she did drive self to ED

## 2017-12-05 NOTE — ED Notes (Signed)
Patient transported to MRI 

## 2017-12-06 ENCOUNTER — Encounter (HOSPITAL_COMMUNITY): Payer: Self-pay | Admitting: Family Medicine

## 2017-12-06 DIAGNOSIS — R1013 Epigastric pain: Secondary | ICD-10-CM | POA: Diagnosis not present

## 2017-12-06 DIAGNOSIS — E119 Type 2 diabetes mellitus without complications: Secondary | ICD-10-CM

## 2017-12-06 DIAGNOSIS — I10 Essential (primary) hypertension: Secondary | ICD-10-CM | POA: Diagnosis present

## 2017-12-06 DIAGNOSIS — K7689 Other specified diseases of liver: Secondary | ICD-10-CM

## 2017-12-06 DIAGNOSIS — R933 Abnormal findings on diagnostic imaging of other parts of digestive tract: Secondary | ICD-10-CM | POA: Diagnosis not present

## 2017-12-06 LAB — CBC WITH DIFFERENTIAL/PLATELET
ABS IMMATURE GRANULOCYTES: 0.1 10*3/uL (ref 0.0–0.1)
Basophils Absolute: 0.1 10*3/uL (ref 0.0–0.1)
Basophils Relative: 1 %
Eosinophils Absolute: 0.1 10*3/uL (ref 0.0–0.7)
Eosinophils Relative: 1 %
HCT: 36.7 % (ref 36.0–46.0)
Hemoglobin: 12 g/dL (ref 12.0–15.0)
IMMATURE GRANULOCYTES: 1 %
LYMPHS ABS: 2.2 10*3/uL (ref 0.7–4.0)
Lymphocytes Relative: 21 %
MCH: 30.2 pg (ref 26.0–34.0)
MCHC: 32.7 g/dL (ref 30.0–36.0)
MCV: 92.2 fL (ref 78.0–100.0)
Monocytes Absolute: 1 10*3/uL (ref 0.1–1.0)
Monocytes Relative: 9 %
NEUTROS ABS: 7.2 10*3/uL (ref 1.7–7.7)
NEUTROS PCT: 67 %
PLATELETS: 287 10*3/uL (ref 150–400)
RBC: 3.98 MIL/uL (ref 3.87–5.11)
RDW: 14 % (ref 11.5–15.5)
WBC: 10.5 10*3/uL (ref 4.0–10.5)

## 2017-12-06 LAB — GLUCOSE, CAPILLARY
GLUCOSE-CAPILLARY: 114 mg/dL — AB (ref 70–99)
GLUCOSE-CAPILLARY: 150 mg/dL — AB (ref 70–99)
GLUCOSE-CAPILLARY: 128 mg/dL — AB (ref 70–99)
GLUCOSE-CAPILLARY: 103 mg/dL — AB (ref 70–99)

## 2017-12-06 MED ORDER — ONDANSETRON HCL 4 MG/2ML IJ SOLN: 4.0000 mg | Freq: Four times a day (QID) | INTRAMUSCULAR | Status: DC | PRN

## 2017-12-06 MED ORDER — HYDROCODONE-ACETAMINOPHEN 5-325 MG PO TABS
1.0000 | ORAL_TABLET | ORAL | Status: DC | PRN
  Administered 2017-12-06 (×3): 2 via ORAL
  Filled 2017-12-06 (×3): qty 2

## 2017-12-06 MED ORDER — HYDROMORPHONE HCL 1 MG/ML IJ SOLN
0.5000 mg | Freq: Once | INTRAMUSCULAR | Status: AC
  Administered 2017-12-06: 0.5 mg via INTRAVENOUS
  Filled 2017-12-06: qty 1

## 2017-12-06 MED ORDER — HYDROCHLOROTHIAZIDE 12.5 MG PO CAPS
12.5000 mg | ORAL_CAPSULE | Freq: Every day | ORAL | Status: DC
  Administered 2017-12-06: 12.5 mg via ORAL
  Filled 2017-12-06: qty 1

## 2017-12-06 MED ORDER — HYDROCODONE-ACETAMINOPHEN 5-325 MG PO TABS: 1.0000 | ORAL_TABLET | ORAL | 0 refills | Status: DC | PRN

## 2017-12-06 MED ORDER — SODIUM CHLORIDE 0.9% FLUSH: 3.0000 mL | INTRAVENOUS | Status: DC | PRN

## 2017-12-06 MED ORDER — PIPERACILLIN-TAZOBACTAM 3.375 G IVPB 30 MIN
3.3750 g | Freq: Once | INTRAVENOUS | Status: AC
  Administered 2017-12-06: 3.375 g via INTRAVENOUS
  Filled 2017-12-06: qty 50

## 2017-12-06 MED ORDER — IRBESARTAN 75 MG PO TABS
75.0000 mg | ORAL_TABLET | Freq: Every day | ORAL | Status: DC
  Administered 2017-12-06: 75 mg via ORAL
  Filled 2017-12-06: qty 1

## 2017-12-06 MED ORDER — INSULIN ASPART 100 UNIT/ML ~~LOC~~ SOLN
0.0000 [IU] | SUBCUTANEOUS | Status: DC
  Administered 2017-12-06 (×2): 1 [IU] via SUBCUTANEOUS

## 2017-12-06 MED ORDER — ADULT MULTIVITAMIN W/MINERALS CH
1.0000 | ORAL_TABLET | Freq: Every day | ORAL | Status: DC
  Administered 2017-12-06: 1 via ORAL
  Filled 2017-12-06: qty 1

## 2017-12-06 MED ORDER — WHITE PETROLATUM EX OINT
TOPICAL_OINTMENT | CUTANEOUS | Status: AC
  Filled 2017-12-06: qty 28.35

## 2017-12-06 MED ORDER — VITAMIN D 1000 UNITS PO TABS
2000.0000 [IU] | ORAL_TABLET | Freq: Every day | ORAL | Status: DC
  Administered 2017-12-06: 2000 [IU] via ORAL
  Filled 2017-12-06: qty 2

## 2017-12-06 MED ORDER — ACETAMINOPHEN 325 MG PO TABS: 650.0000 mg | ORAL_TABLET | Freq: Four times a day (QID) | ORAL | Status: DC | PRN

## 2017-12-06 MED ORDER — BUPROPION HCL ER (XL) 150 MG PO TB24
150.0000 mg | ORAL_TABLET | Freq: Every day | ORAL | Status: DC
  Administered 2017-12-06: 150 mg via ORAL
  Filled 2017-12-06: qty 1

## 2017-12-06 MED ORDER — ACETAMINOPHEN 650 MG RE SUPP: 650.0000 mg | Freq: Four times a day (QID) | RECTAL | Status: DC | PRN

## 2017-12-06 MED ORDER — SODIUM CHLORIDE 0.9 % IV SOLN
250.0000 mL | INTRAVENOUS | Status: DC | PRN
  Administered 2017-12-06: 250 mL via INTRAVENOUS

## 2017-12-06 MED ORDER — VALSARTAN-HYDROCHLOROTHIAZIDE 80-12.5 MG PO TABS: 1.0000 | ORAL_TABLET | Freq: Every day | ORAL | Status: DC

## 2017-12-06 MED ORDER — METRONIDAZOLE IN NACL 5-0.79 MG/ML-% IV SOLN
500.0000 mg | Freq: Three times a day (TID) | INTRAVENOUS | Status: DC
  Administered 2017-12-06: 500 mg via INTRAVENOUS
  Filled 2017-12-06 (×2): qty 100

## 2017-12-06 MED ORDER — ONDANSETRON HCL 4 MG PO TABS: 4.0000 mg | ORAL_TABLET | Freq: Four times a day (QID) | ORAL | Status: DC | PRN

## 2017-12-06 MED ORDER — SODIUM CHLORIDE 0.9 % IV SOLN
2.0000 g | INTRAVENOUS | Status: DC
  Administered 2017-12-06: 2 g via INTRAVENOUS
  Filled 2017-12-06: qty 20

## 2017-12-06 MED ORDER — SODIUM CHLORIDE 0.9% FLUSH: 3.0000 mL | Freq: Two times a day (BID) | INTRAVENOUS | Status: DC

## 2017-12-06 NOTE — Plan of Care (Signed)
  Problem: Education: Goal: Knowledge of General Education information will improve Description Including pain rating scale, medication(s)/side effects and non-pharmacologic comfort measures Outcome: Progressing   

## 2017-12-06 NOTE — ED Provider Notes (Signed)
Care assumed from previous provider PA Moodus. Please see their note for further details to include full history and physical. To summarize in short pt is a 67 year old lady presents to outside hospital with right upper quadrant abdominal pain and fever.. Case discussed, plan agreed upon.  At time of care handoff was awaiting MRI.  Spoke with the radiologist concerning the MRI.  Patient does have several cysts that seem to be hemorrhagic in nature.  Also reports some inflammatory changes around the cyst that could be consistent with infection.  Patient does have elevated white blood cell count.  She reports fever at home has been afebrile in the ED.  Will start on Zosyn.  I spoke with Dr. Almyra Free with GI who states they will see patient in consultation in the a.m.  Recommends general surgery consultation.  Spoke with Dr. Georgette Dover with general surgery.  He states this is not a surgical case and the primary team can reconsult surgery if needed.  Patient continues take pain of pain.  Re-dose pain medication.  She is asking for food.  Patient remains hemodynamic stable.  No signs of acute blood loss.  Spoke with Dr. Myna Hidalgo with hospital medicine who agrees to admission will see patient in the ED and place admission orders.       [name], PA-C 12/06/17 1751    Sherwood Gambler, MD 12/06/17 1336

## 2017-12-06 NOTE — H&P (Signed)
History and Physical    Jupiter Boys LHT:[id number] DOB: 05-25-1950 DOA: 12/05/2017  PCP: Jonathon Jordan, MD   Patient coming from: Home   Chief Complaint: RUQ pain   HPI: Stephanie Fox is a 67 y.o. female with medical history significant for hypertension and type 2 diabetes mellitus, now presenting to the emergency department for evaluation of right upper quadrant pain.  Patient reports that she had been exercising on a rowing machine for the first time 2 weeks ago, developed pain in the epigastrium and right upper quadrant the following day, this resolved within a couple days, but returned yesterday, now more severe.  She describes the pain as constant, sharp, radiating to the right shoulder, and without nausea or vomiting.  She had chills yesterday, but none today.  ED Course: Upon arrival to the ED, patient is found to be afebrile, saturating well on room air, and vitals otherwise normal.  EKG features a sinus rhythm with QTc interval 502 ms.  CT of the abdomen and pelvis is notable for numerous cystic lesions within the liver, largest of which is in the left hepatic lobe with features suggesting possible hemorrhage, as well as some mild stranding in the adjacent fat.  Chemistry panel was unremarkable and CBC notable for a leukocytosis to 12,200.  ED physician discussed case with GI who recommended MRI.  MRI was performed, official read still pending, but physician states that he was called by the radiologist informed that this cyst likely has some hemorrhage and possible infection.  Case was discussed with gastroenterology again and a medical admission was recommended, indicating that they would consult on the patient in the morning.  The patient was treated with Dilaudid, Zofran, and Zosyn in the ED.  She remains hemodynamically stable and will be admitted for ongoing evaluation and management.   Review of Systems:  All other systems reviewed and apart from HPI, are negative.  Past Medical  History:  Diagnosis Date  . Anal or rectal pain    per pt / had 3 times  in last 3 years  . Anemia    as a child   . Arthritis   . DDD (degenerative disc disease), lumbosacral    l4-5, S1  . Diabetes mellitus ([place])    type II   . Dyspnea    with exertion   . Edema    lower extremities   . GERD (gastroesophageal reflux disease)    in past  . History of kidney stones   . Hyperlipidemia   . Hypertension   . Low back pain   . Spinal stenosis     Past Surgical History:  Procedure Laterality Date  . BREAST REDUCTION SURGERY  12/2002   Bil  . KNEE SURGERY    . LUMBAR EPIDURAL INJECTION     In Dec 2018,had 4 injection  . REDUCTION MAMMAPLASTY Bilateral 15 years ago  . thumb surgery  1990   right thumb  . 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 underarm surgery  1980's   gland under right arm got infected due to shaving     reports that she has been smoking cigarettes. She has been smoking about 0.50 packs per day. She has never used smokeless tobacco. She reports that she does not drink alcohol or use drugs.  No Known Allergies  Family History  Problem Relation  Age of Onset  . Colon cancer Neg Hx      Prior to Admission medications   Medication Sig Start Date End Date Taking? Authorizing Provider  acetaminophen (TYLENOL) 500 MG tablet Take 2 tablets (1,000 mg total) by mouth every 8 (eight) hours. Patient taking differently: Take 1,000 mg by mouth every 8 (eight) hours as needed.  04/09/16  Yes Danae Orleans, PA-C  aspirin 81 MG chewable tablet Chew 81 mg by mouth daily.    Yes [provider]  benzonatate (TESSALON) 100 MG capsule Take 100 mg by mouth 2 (two) times daily as needed for cough.  01/27/12  Yes [provider]  buPROPion (WELLBUTRIN XL) 150 MG 24 hr tablet Take 150 mg by  mouth daily. 11/29/17  Yes [provider]  Cholecalciferol (VITAMIN D3) 2000 UNITS TABS Take 2,000 Units by mouth daily.    Yes [provider]  ezetimibe (ZETIA) 10 MG tablet Take 10 mg by mouth daily. 07/01/17  Yes [provider]  furosemide (LASIX) 40 MG tablet Take 40 mg by mouth daily as needed for fluid.    Yes [provider]  glimepiride (AMARYL) 1 MG tablet Take 1 mg by mouth as needed.   Yes [provider]  Liraglutide (VICTOZA) 18 MG/3ML SOPN Inject 0.6 mg into the skin daily.    Yes [provider]  metFORMIN (GLUCOPHAGE) 500 MG tablet Take 500 mg by mouth 2 (two) times daily with a meal.   Yes [provider]  Multiple Vitamin (MULTIVITAMIN) tablet Take 1 tablet by mouth daily. Reported on 07/21/2015   Yes [provider]  Nicotine 21-14-7 MG/24HR KIT USE AS DIRECTED 10/21/17  Yes Mannam, Praveen, MD  valsartan-hydrochlorothiazide (DIOVAN-HCT) 80-12.5 MG per tablet Take 1 tablet by mouth daily.   Yes [provider]  vitamin E 400 UNIT capsule Take 400 Units by mouth 2 (two) times daily.    Yes [provider]    Physical Exam: Vitals:   12/05/17 2300 12/05/17 2301 12/05/17 2315 12/06/17 0000  BP: (!) 122/99 (!) 122/99 139/72 137/85  Pulse: 98 98 96 98  Resp: (!) 23 19 (!) 22 (!) 24  Temp:      TempSrc:      SpO2:  92%    Weight:      Height:          Constitutional: NAD, calm  Eyes: PERTLA, lids and conjunctivae normal ENMT: Mucous membranes are moist. Posterior pharynx clear of any exudate or lesions.   Neck: normal, supple, no masses, no thyromegaly Respiratory: clear to auscultation bilaterally, no wheezing, no crackles. Normal respiratory effort.    Cardiovascular: S1 & S2 heard, regular rate and rhythm. No extremity edema.   Abdomen: No distension, soft, tender in RUQ, no rebound pain or guarding. Bowel sounds active.  Musculoskeletal: no clubbing / cyanosis. No joint deformity  upper and lower extremities.    Skin: no significant rashes, lesions, ulcers. Warm, dry, well-perfused. Neurologic: CN 2-12 grossly intact. Sensation intact. Strength 5/5 in all 4 limbs.  Psychiatric: Alert and oriented x 3. Pleasant and cooperative.     Labs on Admission: I have personally reviewed following labs and imaging studies  CBC: Recent Labs  Lab 12/05/17 1457  WBC 12.2*  NEUTROABS 8.5*  HGB 12.9  HCT 37.4  MCV 89.3  PLT 798   Basic Metabolic Panel: Recent Labs  Lab 12/05/17 1530  NA 135  K 3.7  CL 99  CO2 29  GLUCOSE  117*  BUN 13  CREATININE 0.77  CALCIUM 9.2   GFR: Estimated Creatinine Clearance: 90 mL/min (by C-G formula based on SCr of 0.77 mg/dL). Liver Function Tests: Recent Labs  Lab 12/05/17 1530  AST 15  ALT 19  ALKPHOS 69  BILITOT 0.6  PROT 7.1  ALBUMIN 3.4*   Recent Labs  Lab 12/05/17 1530  LIPASE 72*   No results for input(s): AMMONIA in the last 168 hours. Coagulation Profile: No results for input(s): INR, PROTIME in the last 168 hours. Cardiac Enzymes: No results for input(s): CKTOTAL, CKMB, CKMBINDEX, TROPONINI in the last 168 hours. BNP (last 3 results) No results for input(s): PROBNP in the last 8760 hours. HbA1C: No results for input(s): HGBA1C in the last 72 hours. CBG: Recent Labs  Lab 12/05/17 1923 12/05/17 2058 12/05/17 2241  GLUCAP 92 107* 111*   Lipid Profile: No results for input(s): CHOL, HDL, LDLCALC, TRIG, CHOLHDL, LDLDIRECT in the last 72 hours. Thyroid Function Tests: No results for input(s): TSH, T4TOTAL, FREET4, T3FREE, THYROIDAB in the last 72 hours. Anemia Panel: No results for input(s): VITAMINB12, FOLATE, FERRITIN, TIBC, IRON, RETICCTPCT in the last 72 hours. Urine analysis: No results found for: COLORURINE, APPEARANCEUR, LABSPEC, PHURINE, GLUCOSEU, HGBUR, BILIRUBINUR, KETONESUR, PROTEINUR, UROBILINOGEN, NITRITE, LEUKOCYTESUR Sepsis Labs: _0 (procalcitonin:4,lacticidven:4) )No results  found for this or any previous visit (from the past 240 hour(s)).   Radiological Exams on Admission: Ct Abdomen Pelvis W Contrast  Result Date: 12/05/2017 CLINICAL DATA:  Right upper quadrant pain EXAM: CT ABDOMEN AND PELVIS WITH CONTRAST TECHNIQUE: Multidetector CT imaging of the abdomen and pelvis was performed using the standard protocol following bolus administration of intravenous contrast. CONTRAST:  114m ISOVUE-300 IOPAMIDOL (ISOVUE-300) INJECTION 61% COMPARISON:  Ultrasound 01/13/2013 FINDINGS: Lower chest: Bibasilar atelectasis. No effusions. Heart is mildly enlarged. Moderate coronary artery calcifications in the left coronary artery. Hepatobiliary: Numerous low-density lesions throughout the liver, most which appear to represent simple cysts. There is a 9.2 cm low-density lesion in the left hepatic lobe with mild surrounding stranding and slight increased density relative to the other cystic lesions. No biliary duct dilatation. Gallbladder unremarkable. Pancreas: No focal abnormality or ductal dilatation. Spleen: No focal abnormality.  Normal size. Adrenals/Urinary Tract: No adrenal abnormality. No focal renal abnormality. No stones or hydronephrosis. Urinary bladder is unremarkable. Stomach/Bowel: Normal appendix. Stomach, large and small bowel grossly unremarkable. Vascular/Lymphatic: Aortic atherosclerosis. No enlarged abdominal or pelvic lymph nodes. Reproductive: Calcified and partially calcified fibroids in the uterus. No adnexal mass. Other: No free fluid or free air. Musculoskeletal: No acute bony abnormality. IMPRESSION: Numerous cystic lesions within the liver. The largest is in the left hepatic lobe and extends exophytic off the left lobe of the liver with slight increased density in its relative to the other simple appearing cysts. There is mild surrounding stranding in the adjacent fat. This conceivably could represent hemorrhage into the cyst. This could be better characterized with  liver protocol MRI if felt clinically indicated. Fibroid uterus. Aortic atherosclerosis, coronary artery disease. Bibasilar atelectasis. Mild cardiomegaly. Electronically Signed   By: KRolm BaptiseM.D.   On: 12/05/2017 17:41    EKG: Independently reviewed. Sinus rhythm, QTc 502 ms.   Assessment/Plan   1. Liver cysts with possible hemorrhage, possible infection  - Presents with RUQ pain, found to have liver cysts on CT with concern for possible hemorrhage; MRI was performed, official read pending, but per radiology discussion with ED provider is suggestive of hemorrhage and possible infection   - GI was consulted  by ED physician, recommended medical admission, and will see patient in consultation  - She was treated with analgesia and Zosyn in ED  - Continue prn analgesia, continue empiric abx with Rocephin and Flagyl    2. Hypertension  - BP at goal  - Continue valsartan-HCTZ    3. Type II DM  - No recent A1c on file  - Managed at home with Victoza, metformin, and glimepiride, held on admission  - Check CBGs and use a SSI with Novolog while in hospital    DVT prophylaxis: SCD's  Code Status: Full  Family Communication: Discussed with patient   Consults called: GI  Admission status: Observation    Vianne Bulls, MD [facility] Pager [phone]  If 7PM-7AM, please contact night-coverage [email or web address] Password [email or web address]  12/06/2017, 1:03 AM

## 2017-12-06 NOTE — Consult Note (Signed)
Reason for Consult: Hepatic cysts Referring Physician: [facility]  Stephanie Fox HPI: This is a 67 year old female admitted for a symptomatic hepatic cyst.  She reports that her symptoms started acutely two weeks ago.  At that time she was using a stationary rowing machine and the apparatus had a stationary brace in the epigastric region.  After that time of exercising she started to have pain that lasted for two days.  He pain resolved and she attributed her symptoms to sore muscles.  Her pain recurred acutely this past Wednesday and it woke her from sleep.  The pain was sharp and located in the epigastric lower sternal region with radiation to her right shoulder.  A CT scan was performed and it showed that she had multiple hepatic cysts.  This find was not new as she had a prior abdominal imaging at [facility] on 11/04/2014 as part of a renal protocol and a CT of her chest on 08/16/2017.  The low cut views of the CT scan show that the largest cyst in the left hepatic lobe was at 7.2 cm.  The CT scan on admission showed that the cyst increased in size to 9.1 cm.  There were numerous other cysts, but this particular cyst was exophytic and associated with some stranding/inflammatory changes in the omentum and mesentery.  She also has two other complex cysts in the right hepatic lobe exhibiting internal debris.  Cholelithiasis was also identified.  Currently she feels well.  In fact, she states that she does not feel that her illness warrants hospitalization.  Past Medical History:  Diagnosis Date  . Anal or rectal pain    per pt / had 3 times  in last 3 years  . Anemia    as a child   . Arthritis   . DDD (degenerative disc disease), lumbosacral    l4-5, S1  . Diabetes mellitus ([place])    type II   . Dyspnea    with exertion   . Edema    lower extremities   . GERD (gastroesophageal reflux disease)    in past  . History of kidney stones   . Hyperlipidemia   . Hypertension   . Low back pain    . Spinal stenosis     Past Surgical History:  Procedure Laterality Date  . BREAST REDUCTION SURGERY  12/2002   Bil  . KNEE SURGERY    . LUMBAR EPIDURAL INJECTION     In Dec 2018,had 4 injection  . REDUCTION MAMMAPLASTY Bilateral 15 years ago  . thumb surgery  1990   right thumb  . 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 underarm surgery  1980's   gland under right arm got infected due to shaving    Family History  Problem Relation Age of Onset  . Colon cancer Neg Hx     Social History:  reports that she has been smoking cigarettes. She has been smoking about 0.50 packs per day. She has never used smokeless tobacco. She reports that she does not drink alcohol or use drugs.  Allergies: No Known Allergies  Medications:  Scheduled: . buPROPion  150 mg Oral Daily  . cholecalciferol  2,000 Units Oral Daily  . irbesartan  75 mg Oral Daily   And  . hydrochlorothiazide  12.5 mg Oral Daily  . insulin  aspart  0-9 Units Subcutaneous Q4H  . multivitamin with minerals  1 tablet Oral Daily  . white petrolatum       Continuous: . cefTRIAXone (ROCEPHIN)  IV 2 g (12/06/17 0813)  . metronidazole 500 mg (12/06/17 0515)    Results for orders placed or performed during the hospital encounter of 12/05/17 (from the past 24 hour(s))  CBC with Diff     Status: Abnormal   Collection Time: 12/05/17  2:57 PM  Result Value Ref Range   WBC 12.2 (H) 4.0 - 10.5 K/uL   RBC 4.19 3.87 - 5.11 MIL/uL   Hemoglobin 12.9 12.0 - 15.0 g/dL   HCT 37.4 36.0 - 46.0 %   MCV 89.3 78.0 - 100.0 fL   MCH 30.8 26.0 - 34.0 pg   MCHC 34.5 30.0 - 36.0 g/dL   RDW 14.3 11.5 - 15.5 %   Platelets 311 150 - 400 K/uL   Neutrophils Relative % 69 %   Neutro Abs 8.5 (H) 1.7 - 7.7 K/uL   Lymphocytes Relative 20 %   Lymphs Abs 2.4 0.7 - 4.0 K/uL    Monocytes Relative 10 %   Monocytes Absolute 1.2 (H) 0.1 - 1.0 K/uL   Eosinophils Relative 1 %   Eosinophils Absolute 0.1 0.0 - 0.7 K/uL   Basophils Relative 0 %   Basophils Absolute 0.0 0.0 - 0.1 K/uL  Comprehensive metabolic panel     Status: Abnormal   Collection Time: 12/05/17  3:30 PM  Result Value Ref Range   Sodium 135 135 - 145 mmol/L   Potassium 3.7 3.5 - 5.1 mmol/L   Chloride 99 98 - 111 mmol/L   CO2 29 22 - 32 mmol/L   Glucose, Bld 117 (H) 70 - 99 mg/dL   BUN 13 8 - 23 mg/dL   Creatinine, Ser 0.77 0.44 - 1.00 mg/dL   Calcium 9.2 8.9 - 10.3 mg/dL   Total Protein 7.1 6.5 - 8.1 g/dL   Albumin 3.4 (L) 3.5 - 5.0 g/dL   AST 15 15 - 41 U/L   ALT 19 0 - 44 U/L   Alkaline Phosphatase 69 38 - 126 U/L   Total Bilirubin 0.6 0.3 - 1.2 mg/dL   GFR calc non Af Amer >60 >60 mL/min   GFR calc Af Amer >60 >60 mL/min   Anion gap 7 5 - 15  Lipase, blood     Status: Abnormal   Collection Time: 12/05/17  3:30 PM  Result Value Ref Range   Lipase 72 (H) 11 - 51 U/L  CBG monitoring, ED     Status: None   Collection Time: 12/05/17  7:23 PM  Result Value Ref Range   Glucose-Capillary 92 70 - 99 mg/dL  POC CBG, ED     Status: Abnormal   Collection Time: 12/05/17  8:58 PM  Result Value Ref Range   Glucose-Capillary 107 (H) 70 - 99 mg/dL   Comment 1 Notify RN    Comment 2 Document in Chart   CBG monitoring, ED     Status: Abnormal   Collection Time: 12/05/17 10:41 PM  Result Value Ref Range   Glucose-Capillary 111 (H) 70 - 99 mg/dL   Comment 1 Notify RN    Comment 2 Document in Chart   Glucose, capillary     Status: Abnormal   Collection Time: 12/06/17  1:47 AM  Result Value Ref Range   Glucose-Capillary 114 (H) 70 - 99 mg/dL  CBC WITH DIFFERENTIAL  Status: None   Collection Time: 12/06/17  4:23 AM  Result Value Ref Range   WBC 10.5 4.0 - 10.5 K/uL   RBC 3.98 3.87 - 5.11 MIL/uL   Hemoglobin 12.0 12.0 - 15.0 g/dL   HCT 36.7 36.0 - 46.0 %   MCV 92.2 78.0 - 100.0 fL   MCH 30.2  26.0 - 34.0 pg   MCHC 32.7 30.0 - 36.0 g/dL   RDW 14.0 11.5 - 15.5 %   Platelets 287 150 - 400 K/uL   Neutrophils Relative % 67 %   Neutro Abs 7.2 1.7 - 7.7 K/uL   Lymphocytes Relative 21 %   Lymphs Abs 2.2 0.7 - 4.0 K/uL   Monocytes Relative 9 %   Monocytes Absolute 1.0 0.1 - 1.0 K/uL   Eosinophils Relative 1 %   Eosinophils Absolute 0.1 0.0 - 0.7 K/uL   Basophils Relative 1 %   Basophils Absolute 0.1 0.0 - 0.1 K/uL   Immature Granulocytes 1 %   Abs Immature Granulocytes 0.1 0.0 - 0.1 K/uL  Glucose, capillary     Status: Abnormal   Collection Time: 12/06/17  5:09 AM  Result Value Ref Range   Glucose-Capillary 150 (H) 70 - 99 mg/dL  Glucose, capillary     Status: Abnormal   Collection Time: 12/06/17  7:48 AM  Result Value Ref Range   Glucose-Capillary 128 (H) 70 - 99 mg/dL  Glucose, capillary     Status: Abnormal   Collection Time: 12/06/17 12:09 PM  Result Value Ref Range   Glucose-Capillary 103 (H) 70 - 99 mg/dL     Ct Abdomen Pelvis W Contrast  Result Date: 12/05/2017 CLINICAL DATA:  Right upper quadrant pain EXAM: CT ABDOMEN AND PELVIS WITH CONTRAST TECHNIQUE: Multidetector CT imaging of the abdomen and pelvis was performed using the standard protocol following bolus administration of intravenous contrast. CONTRAST:  110mL ISOVUE-300 IOPAMIDOL (ISOVUE-300) INJECTION 61% COMPARISON:  Ultrasound 01/13/2013 FINDINGS: Lower chest: Bibasilar atelectasis. No effusions. Heart is mildly enlarged. Moderate coronary artery calcifications in the left coronary artery. Hepatobiliary: Numerous low-density lesions throughout the liver, most which appear to represent simple cysts. There is a 9.2 cm low-density lesion in the left hepatic lobe with mild surrounding stranding and slight increased density relative to the other cystic lesions. No biliary duct dilatation. Gallbladder unremarkable. Pancreas: No focal abnormality or ductal dilatation. Spleen: No focal abnormality.  Normal size.  Adrenals/Urinary Tract: No adrenal abnormality. No focal renal abnormality. No stones or hydronephrosis. Urinary bladder is unremarkable. Stomach/Bowel: Normal appendix. Stomach, large and small bowel grossly unremarkable. Vascular/Lymphatic: Aortic atherosclerosis. No enlarged abdominal or pelvic lymph nodes. Reproductive: Calcified and partially calcified fibroids in the uterus. No adnexal mass. Other: No free fluid or free air. Musculoskeletal: No acute bony abnormality. IMPRESSION: Numerous cystic lesions within the liver. The largest is in the left hepatic lobe and extends exophytic off the left lobe of the liver with slight increased density in its relative to the other simple appearing cysts. There is mild surrounding stranding in the adjacent fat. This conceivably could represent hemorrhage into the cyst. This could be better characterized with liver protocol MRI if felt clinically indicated. Fibroid uterus. Aortic atherosclerosis, coronary artery disease. Bibasilar atelectasis. Mild cardiomegaly. Electronically Signed   By: Rolm Baptise M.D.   On: 12/05/2017 17:41   Mr Liver W Wo Contrast  Result Date: 12/06/2017 CLINICAL DATA:  Complex hepatic cystic lesions for further characterization. EXAM: MRI ABDOMEN WITHOUT AND WITH CONTRAST TECHNIQUE: Multiplanar multisequence MR imaging  of the abdomen was performed both before and after the administration of intravenous contrast. CONTRAST:  72mL MULTIHANCE GADOBENATE DIMEGLUMINE 529 MG/ML IV SOLN COMPARISON:  Multiple exams, including 12/05/2017 CT scan FINDINGS: Lower chest: Atelectasis in both lower lobes.  Mild cardiomegaly. Hepatobiliary: There are approximately 6 gallstones measuring up to 1.4 cm in long axis. Common bile duct 8 mm in diameter, without directly visualized choledocholithiasis. There are numerous cystic lesions scattered throughout the liver, ranging from barely perceptible in size up to the size of the largest lesion in segment 3 which  measures 9.1 by 7.5 cm on image 56/14. This larger lesion is complex, with an internal debris level and high precontrast T1 signal, but without well-defined enhancement on the subtraction images. This particular lesion is exophytic and has mass effect on the adjacent gastric antrum and duodenal bulb. Moreover, there is low-grade inflammatory type stranding in the mesentery just below this lesion and in the adjacent falciform ligament area the upper half of the lesion is visible on the prior chest CT from 08/16/2017, and since that time the lesion has clearly enlarged and increased in complexity/density based on comparative CT studies. Inferiorly in the right hepatic lobe there are 2 additional complex lesions. The more anterior lesion on image 65/22 has an internal fluid-level in measures 2.5 by 3.1 cm, without appreciable internal enhancement. The adjacent lesion has high precontrast T1 signal and measures 5.0 by 4.0 cm on image 62/19, likewise without appreciable enhancement. Many of these cystic lesions are present on the prior ultrasound of 01/14/2012, although in particular the dominant left hepatic lobe lesion may have enlarged. Some of the small cystic hepatic lesions appear to have a very thin rim of enhancement, raising the possibility of biliary hamartomas at least for the smaller lesions. Pancreas:  Unremarkable Spleen:  Unremarkable Adrenals/Urinary Tract:  Unremarkable Stomach/Bowel: Mild extrinsic compression of the gastric antrum and proximal duodenum due to the exophytic dominant hepatic cystic lesion from segment 3 of the liver. There is some adjacent stranding in the soft tissues which is probably directly related to the cystic lesion rather than due to underlying gastritis. Vascular/Lymphatic: Aortoiliac atherosclerotic vascular disease. No pathologic adenopathy is identified. Other:  No supplemental non-categorized findings. Musculoskeletal: Lower lumbar degenerative endplate findings.  IMPRESSION: 1. Interval (since 08/16/2017) enlargement and new complexity in a 9.1 cm dominant cystic lesion in the lateral segment left hepatic lobe which is partially exophytic. Surrounding stranding/inflammatory findings in the omentum and mesentery and tracking along the falciform ligament, although not in the hepatic parenchyma itself. Top differential diagnostic considerations are acute hemorrhage into an existing hepatic cyst (which is described in the literature as a potential cause for life-threatening shock) or potentially superinfection of an existing hepatic cystic lesion. I tend to favor the former given the lack of edema in the surrounding hepatic parenchyma. 2. Numerous other hepatic cysts. Two additional cysts are complex with internal debris or blood products inferiorly in the right hepatic lobe, but with little if any of the surrounding inflammatory inflammatory findings found along the dominant lesion in the left hepatic lobe. 3. Cholelithiasis. 4. Mild bibasilar atelectasis. 5. Mild cardiomegaly. 6.  Aortic Atherosclerosis (ICD10-I70.0). These results were called by telephone at the time of interpretation on 12/06/2017 at 8:29 am to Dr. Verlon Au, who verbally acknowledged these results. Electronically Signed   By: Van Clines M.D.   On: 12/06/2017 08:29    ROS:  As stated above in the HPI otherwise negative.  Blood pressure 113/65, pulse 73, temperature  97.9 F (36.6 C), temperature source Oral, resp. rate 16, height 5\' 6"  (1.676 m), weight 114.3 kg, SpO2 96 %.    PE: Gen: NAD, Alert and Oriented HEENT:  [place]/AT, EOMI Neck: Supple, no LAD Lungs: CTA Bilaterally CV: RRR without M/G/R ABM: Soft, tender in the epigastric region, morbidly obese, +BS Ext: No C/C/E  Assessment/Plan: 1) Hemorrhagic hepatic cyst. 2) ABM pain. 3) Cholelithiasis.   There is an interval increase in size of her hepatic cyst, but it is not clear if this growth is as a result of a primary source, I.e.,  neoplastic, or secondary as a result of hemorrhage from trauma.  She remains hemodynamically stable and she reports a positive improvement with the antibiotics.  She does not have any further shoulder pain.  It is likely that the antibiotics are helping with the inflammation in some way rather than truly treating an infectious etiology.    Plan: 1) Treat with antibiotics for a total of 7 days. 2) Follow up in the office in one month, but she was encouraged to call the office sooner if necessary. 3) Pending her clinical course, she may not need further evaluation at a tertiary care center with a liver surgeon.  Cyprian Gongaware D 12/06/2017, 1:39 PM

## 2017-12-06 NOTE — ED Notes (Signed)
Half of [place] sandwich given.

## 2017-12-06 NOTE — Progress Notes (Signed)
D/w Dr. Marinda Elk shows possible bleed into hepatic cyst-complex fluid and complex inflammation--no enhancement suggestive of infection--possible cyst hemorrhage> infection Further GI input-not sure of any specific therapies are indicated at this juncture? Verneita Griffes, MD [facility] 8:29 AM

## 2017-12-06 NOTE — Progress Notes (Signed)
Pt discharged home  Pt to leave with friend to home.

## 2017-12-07 NOTE — Discharge Summary (Signed)
Physician Discharge Summary  Stephanie Fox QMV:[id number] DOB: 1950/10/23 DOA: 12/05/2017  PCP: Stephanie Jordan, MD  Admit date: 12/05/2017 Discharge date: 12/07/2017  Time spent: 20 minutes  Recommendations for Outpatient Follow-up:  1. Needs close follow-up with Stephanie Fox of gastroenterology   Discharge Diagnoses:  Principal Problem:   Hepatic cyst Active Problems:   Hypertension   Diabetes mellitus type II, non insulin dependent ([place])   Liver cyst   Discharge Condition: Improved  Diet recommendation: Heart healthy  Filed Weights   12/05/17 1403  Weight: 114.3 kg    History of present illness:  68 year old HTN DM TY 2 active smoker mild emphysema Came into the hospital 8/15 after intense pain while working on the rowing machine at her gym-she has a known history of lung nodules and other than mild illnesses as above is pretty healthy and has been trying to lose weight Found on CT scan to have numerous cystic lesions in the liver-left hepatic lobe showed slightly increased density-MRCP confirmed blood inside the cyst probably secondary to trauma-she was hemodynamically stable Gastroenterology consulted saw the patient prior abdominal imaging 11/04/2014 cysts at that time was 7.2 cm She was sent home as she was hemodynamically stable and without any fever chills it was elected not to send home on antibiotics Follow-up with gastroenterology in the outpatient setting  Discharge Exam: Vitals:   12/06/17 0151 12/06/17 0442  BP: 136/76 113/65  Pulse: 90 73  Resp: 16 16  Temp: 99.1 F (37.3 C) 97.9 F (36.6 C)  SpO2: 91% 96%    General: Awake alert pleasant no distress Cardiovascular: S1-S2 no murmur Respiratory: Clinically clear no added sound No abdominal pain mild tenderness on deep palpation right upper quadrant  Discharge Instructions   Discharge Instructions    Diet - low sodium heart healthy   Complete by:  As directed    Discharge instructions   Complete by:  As  directed    Follow with Stephanie Fox NO strenuos activity-Hold aspirin Stephanie Fox cause you to bleed] until you see Stephanie Fox of luck   Increase activity slowly   Complete by:  As directed      Allergies as of 12/06/2017   No Known Allergies     Medication List    STOP taking these medications   acetaminophen 500 MG tablet Commonly known as:  TYLENOL   aspirin 81 MG chewable tablet     TAKE these medications   benzonatate 100 MG capsule Commonly known as:  TESSALON Take 100 mg by mouth 2 (two) times daily as needed for cough.   buPROPion 150 MG 24 hr tablet Commonly known as:  WELLBUTRIN XL Take 150 mg by mouth daily.   ezetimibe 10 MG tablet Commonly known as:  ZETIA Take 10 mg by mouth daily.   furosemide 40 MG tablet Commonly known as:  LASIX Take 40 mg by mouth daily as needed for fluid.   glimepiride 1 MG tablet Commonly known as:  AMARYL Take 1 mg by mouth as needed.   HYDROcodone-acetaminophen 5-325 MG tablet Commonly known as:  NORCO/VICODIN Take 1-2 tablets by mouth every 4 (four) hours as needed for moderate pain or severe pain.   metFORMIN 500 MG tablet Commonly known as:  GLUCOPHAGE Take 500 mg by mouth 2 (two) times daily with a meal.   multivitamin tablet Take 1 tablet by mouth daily. Reported on 07/21/2015   Nicotine 21-14-7 MG/24HR Kit USE AS DIRECTED   valsartan-hydrochlorothiazide 80-12.5 MG tablet Commonly known as:  DIOVAN-HCT Take 1 tablet by mouth daily.   VICTOZA 18 MG/3ML Sopn Generic drug:  liraglutide Inject 0.6 mg into the skin daily.   Vitamin D3 2000 units Tabs Take 2,000 Units by mouth daily.   vitamin E 400 UNIT capsule Take 400 Units by mouth 2 (two) times daily.      No Known Allergies    The results of significant diagnostics from this hospitalization (including imaging, microbiology, ancillary and laboratory) are listed below for reference.    Significant Diagnostic Studies: Ct Abdomen Pelvis W  Contrast  Result Date: 12/05/2017 CLINICAL DATA:  Right upper quadrant pain EXAM: CT ABDOMEN AND PELVIS WITH CONTRAST TECHNIQUE: Multidetector CT imaging of the abdomen and pelvis was performed using the standard protocol following bolus administration of intravenous contrast. CONTRAST:  176m ISOVUE-300 IOPAMIDOL (ISOVUE-300) INJECTION 61% COMPARISON:  Ultrasound 01/13/2013 FINDINGS: Lower chest: Bibasilar atelectasis. No effusions. Heart is mildly enlarged. Moderate coronary artery calcifications in the left coronary artery. Hepatobiliary: Numerous low-density lesions throughout the liver, most which appear to represent simple cysts. There is a 9.2 cm low-density lesion in the left hepatic lobe with mild surrounding stranding and slight increased density relative to the other cystic lesions. No biliary duct dilatation. Gallbladder unremarkable. Pancreas: No focal abnormality or ductal dilatation. Spleen: No focal abnormality.  Normal size. Adrenals/Urinary Tract: No adrenal abnormality. No focal renal abnormality. No stones or hydronephrosis. Urinary bladder is unremarkable. Stomach/Bowel: Normal appendix. Stomach, large and small bowel grossly unremarkable. Vascular/Lymphatic: Aortic atherosclerosis. No enlarged abdominal or pelvic lymph nodes. Reproductive: Calcified and partially calcified fibroids in the uterus. No adnexal mass. Other: No free fluid or free air. Musculoskeletal: No acute bony abnormality. IMPRESSION: Numerous cystic lesions within the liver. The largest is in the left hepatic lobe and extends exophytic off the left lobe of the liver with slight increased density in its relative to the other simple appearing cysts. There is mild surrounding stranding in the adjacent fat. This conceivably could represent hemorrhage into the cyst. This could be better characterized with liver protocol MRI if felt clinically indicated. Fibroid uterus. Aortic atherosclerosis, coronary artery disease. Bibasilar  atelectasis. Mild cardiomegaly. Electronically Signed   By: KRolm BaptiseM.D.   On: 12/05/2017 17:41   Mr Liver W Wo Contrast  Result Date: 12/06/2017 CLINICAL DATA:  Complex hepatic cystic lesions for further characterization. EXAM: MRI ABDOMEN WITHOUT AND WITH CONTRAST TECHNIQUE: Multiplanar multisequence MR imaging of the abdomen was performed both before and after the administration of intravenous contrast. CONTRAST:  234mMULTIHANCE GADOBENATE DIMEGLUMINE 529 MG/ML IV SOLN COMPARISON:  Multiple exams, including 12/05/2017 CT scan FINDINGS: Lower chest: Atelectasis in both lower lobes.  Mild cardiomegaly. Hepatobiliary: There are approximately 6 gallstones measuring up to 1.4 cm in long axis. Common bile duct 8 mm in diameter, without directly visualized choledocholithiasis. There are numerous cystic lesions scattered throughout the liver, ranging from barely perceptible in size up to the size of the largest lesion in segment 3 which measures 9.1 by 7.5 cm on image 56/14. This larger lesion is complex, with an internal debris level and high precontrast T1 signal, but without well-defined enhancement on the subtraction images. This particular lesion is exophytic and has mass effect on the adjacent gastric antrum and duodenal bulb. Moreover, there is low-grade inflammatory type stranding in the mesentery just below this lesion and in the adjacent falciform ligament area the upper half of the lesion is visible on the prior chest CT from 08/16/2017, and since that time the lesion has  clearly enlarged and increased in complexity/density based on comparative CT studies. Inferiorly in the right hepatic lobe there are 2 additional complex lesions. The more anterior lesion on image 65/22 has an internal fluid-level in measures 2.5 by 3.1 cm, without appreciable internal enhancement. The adjacent lesion has high precontrast T1 signal and measures 5.0 by 4.0 cm on image 62/19, likewise without appreciable enhancement.  Many of these cystic lesions are present on the prior ultrasound of 01/14/2012, although in particular the dominant left hepatic lobe lesion may have enlarged. Some of the small cystic hepatic lesions appear to have a very thin rim of enhancement, raising the possibility of biliary hamartomas at least for the smaller lesions. Pancreas:  Unremarkable Spleen:  Unremarkable Adrenals/Urinary Tract:  Unremarkable Stomach/Bowel: Mild extrinsic compression of the gastric antrum and proximal duodenum due to the exophytic dominant hepatic cystic lesion from segment 3 of the liver. There is some adjacent stranding in the soft tissues which is probably directly related to the cystic lesion rather than due to underlying gastritis. Vascular/Lymphatic: Aortoiliac atherosclerotic vascular disease. No pathologic adenopathy is identified. Other:  No supplemental non-categorized findings. Musculoskeletal: Lower lumbar degenerative endplate findings. IMPRESSION: 1. Interval (since 08/16/2017) enlargement and new complexity in a 9.1 cm dominant cystic lesion in the lateral segment left hepatic lobe which is partially exophytic. Surrounding stranding/inflammatory findings in the omentum and mesentery and tracking along the falciform ligament, although not in the hepatic parenchyma itself. Top differential diagnostic considerations are acute hemorrhage into an existing hepatic cyst (which is described in the literature as a potential cause for life-threatening shock) or potentially superinfection of an existing hepatic cystic lesion. I tend to favor the former given the lack of edema in the surrounding hepatic parenchyma. 2. Numerous other hepatic cysts. Two additional cysts are complex with internal debris or blood products inferiorly in the right hepatic lobe, but with little if any of the surrounding inflammatory inflammatory findings found along the dominant lesion in the left hepatic lobe. 3. Cholelithiasis. 4. Mild bibasilar  atelectasis. 5. Mild cardiomegaly. 6.  Aortic Atherosclerosis (ICD10-I70.0). These results were called by telephone at the time of interpretation on 12/06/2017 at 8:29 am to Dr. Verlon Au, who verbally acknowledged these results. Electronically Signed   By: Van Clines M.D.   On: 12/06/2017 08:29    Microbiology: No results found for this or any previous visit (from the past 240 hour(s)).   Labs: Basic Metabolic Panel: Recent Labs  Lab 12/05/17 1530  NA 135  K 3.7  CL 99  CO2 29  GLUCOSE 117*  BUN 13  CREATININE 0.77  CALCIUM 9.2   Liver Function Tests: Recent Labs  Lab 12/05/17 1530  AST 15  ALT 19  ALKPHOS 69  BILITOT 0.6  PROT 7.1  ALBUMIN 3.4*   Recent Labs  Lab 12/05/17 1530  LIPASE 72*   No results for input(s): AMMONIA in the last 168 hours. CBC: Recent Labs  Lab 12/05/17 1457 12/06/17 0423  WBC 12.2* 10.5  NEUTROABS 8.5* 7.2  HGB 12.9 12.0  HCT 37.4 36.7  MCV 89.3 92.2  PLT 311 287   Cardiac Enzymes: No results for input(s): CKTOTAL, CKMB, CKMBINDEX, TROPONINI in the last 168 hours. BNP: BNP (last 3 results) No results for input(s): BNP in the last 8760 hours.  ProBNP (last 3 results) No results for input(s): PROBNP in the last 8760 hours.  CBG: Recent Labs  Lab 12/05/17 2241 12/06/17 0147 12/06/17 0509 12/06/17 0748 12/06/17 1209  GLUCAP 111* 114*  150* 128* 103*       Signed:  Nita Sells MD   [facility] 12/07/2017, 3:26 PM

## 2017-12-25 ENCOUNTER — Other Ambulatory Visit: Payer: Self-pay | Admitting: Gastroenterology

## 2017-12-25 DIAGNOSIS — R1013 Epigastric pain: Secondary | ICD-10-CM | POA: Diagnosis not present

## 2017-12-25 DIAGNOSIS — R933 Abnormal findings on diagnostic imaging of other parts of digestive tract: Secondary | ICD-10-CM | POA: Diagnosis not present

## 2017-12-25 DIAGNOSIS — K7689 Other specified diseases of liver: Secondary | ICD-10-CM | POA: Diagnosis not present

## 2018-01-03 ENCOUNTER — Ambulatory Visit (HOSPITAL_COMMUNITY)
Admission: RE | Admit: 2018-01-03 | Discharge: 2018-01-03 | Disposition: A | Discharge status: Home / Self Care | Payer: PPO | Source: Ambulatory Visit | Attending: Gastroenterology | Admitting: Gastroenterology

## 2018-01-03 DIAGNOSIS — K7689 Other specified diseases of liver: Secondary | ICD-10-CM | POA: Diagnosis not present

## 2018-01-03 DIAGNOSIS — I700 Atherosclerosis of aorta: Secondary | ICD-10-CM | POA: Insufficient documentation

## 2018-01-03 DIAGNOSIS — R933 Abnormal findings on diagnostic imaging of other parts of digestive tract: Secondary | ICD-10-CM

## 2018-01-03 MED ORDER — IOHEXOL 300 MG/ML  SOLN
100.0000 mL | Freq: Once | INTRAMUSCULAR | Status: AC | PRN
  Administered 2018-01-03: 100 mL via INTRAVENOUS

## 2018-01-21 DIAGNOSIS — E119 Type 2 diabetes mellitus without complications: Secondary | ICD-10-CM | POA: Diagnosis not present

## 2018-01-21 DIAGNOSIS — E7800 Pure hypercholesterolemia, unspecified: Secondary | ICD-10-CM | POA: Diagnosis not present

## 2018-01-28 DIAGNOSIS — E119 Type 2 diabetes mellitus without complications: Secondary | ICD-10-CM | POA: Diagnosis not present

## 2018-01-28 DIAGNOSIS — E7800 Pure hypercholesterolemia, unspecified: Secondary | ICD-10-CM | POA: Diagnosis not present

## 2018-01-28 DIAGNOSIS — R05 Cough: Secondary | ICD-10-CM | POA: Diagnosis not present

## 2018-01-28 DIAGNOSIS — I10 Essential (primary) hypertension: Secondary | ICD-10-CM | POA: Diagnosis not present

## 2018-01-28 DIAGNOSIS — Z23 Encounter for immunization: Secondary | ICD-10-CM | POA: Diagnosis not present

## 2018-02-22 IMAGING — DX DG CHEST 2V
2 series · 2 of 2 positions shown · non-contrast
Comparison: None.

CLINICAL DATA: Preop for knee surgery, chronic knee pain

EXAM:
CHEST  2 VIEW

[chest pa]
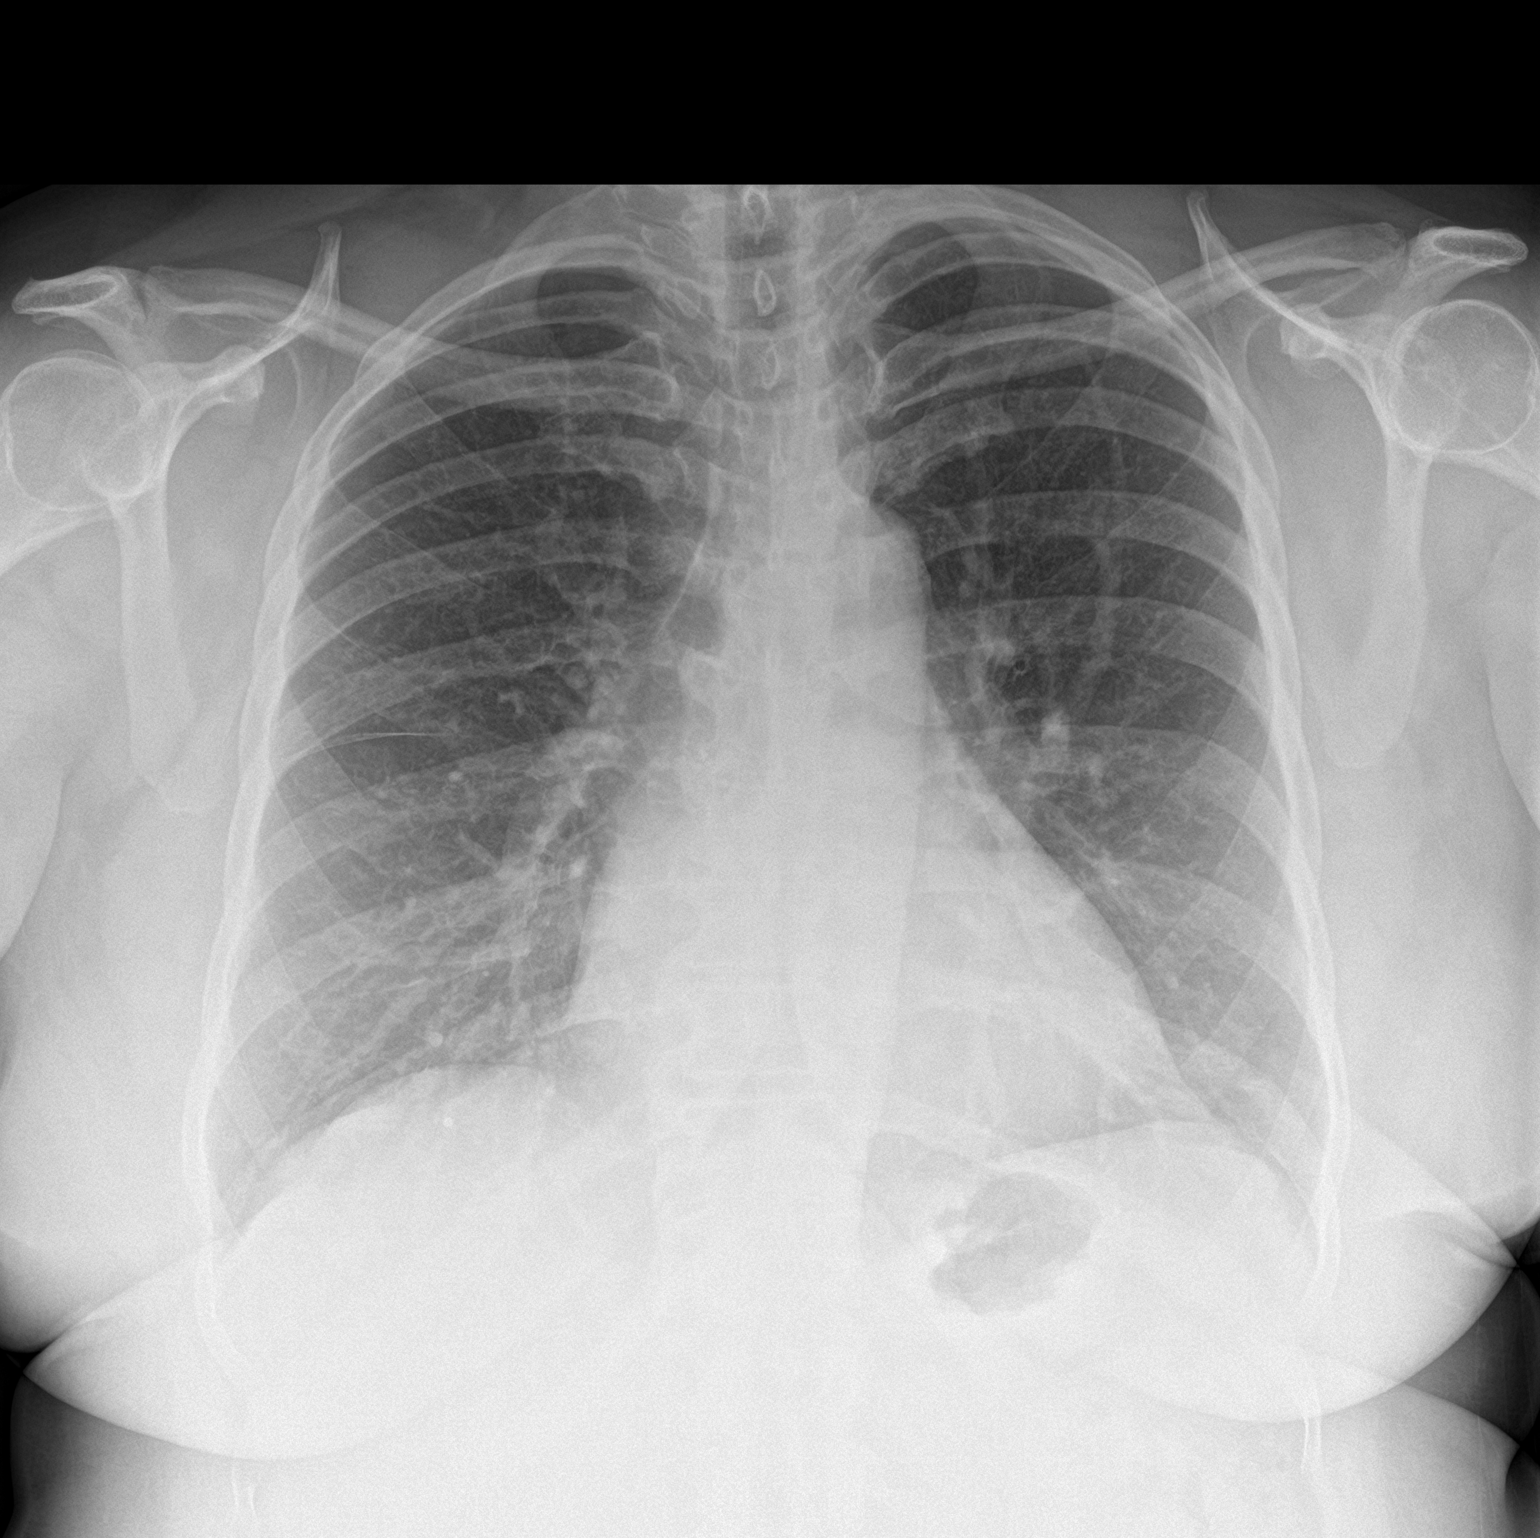

[chest lat]
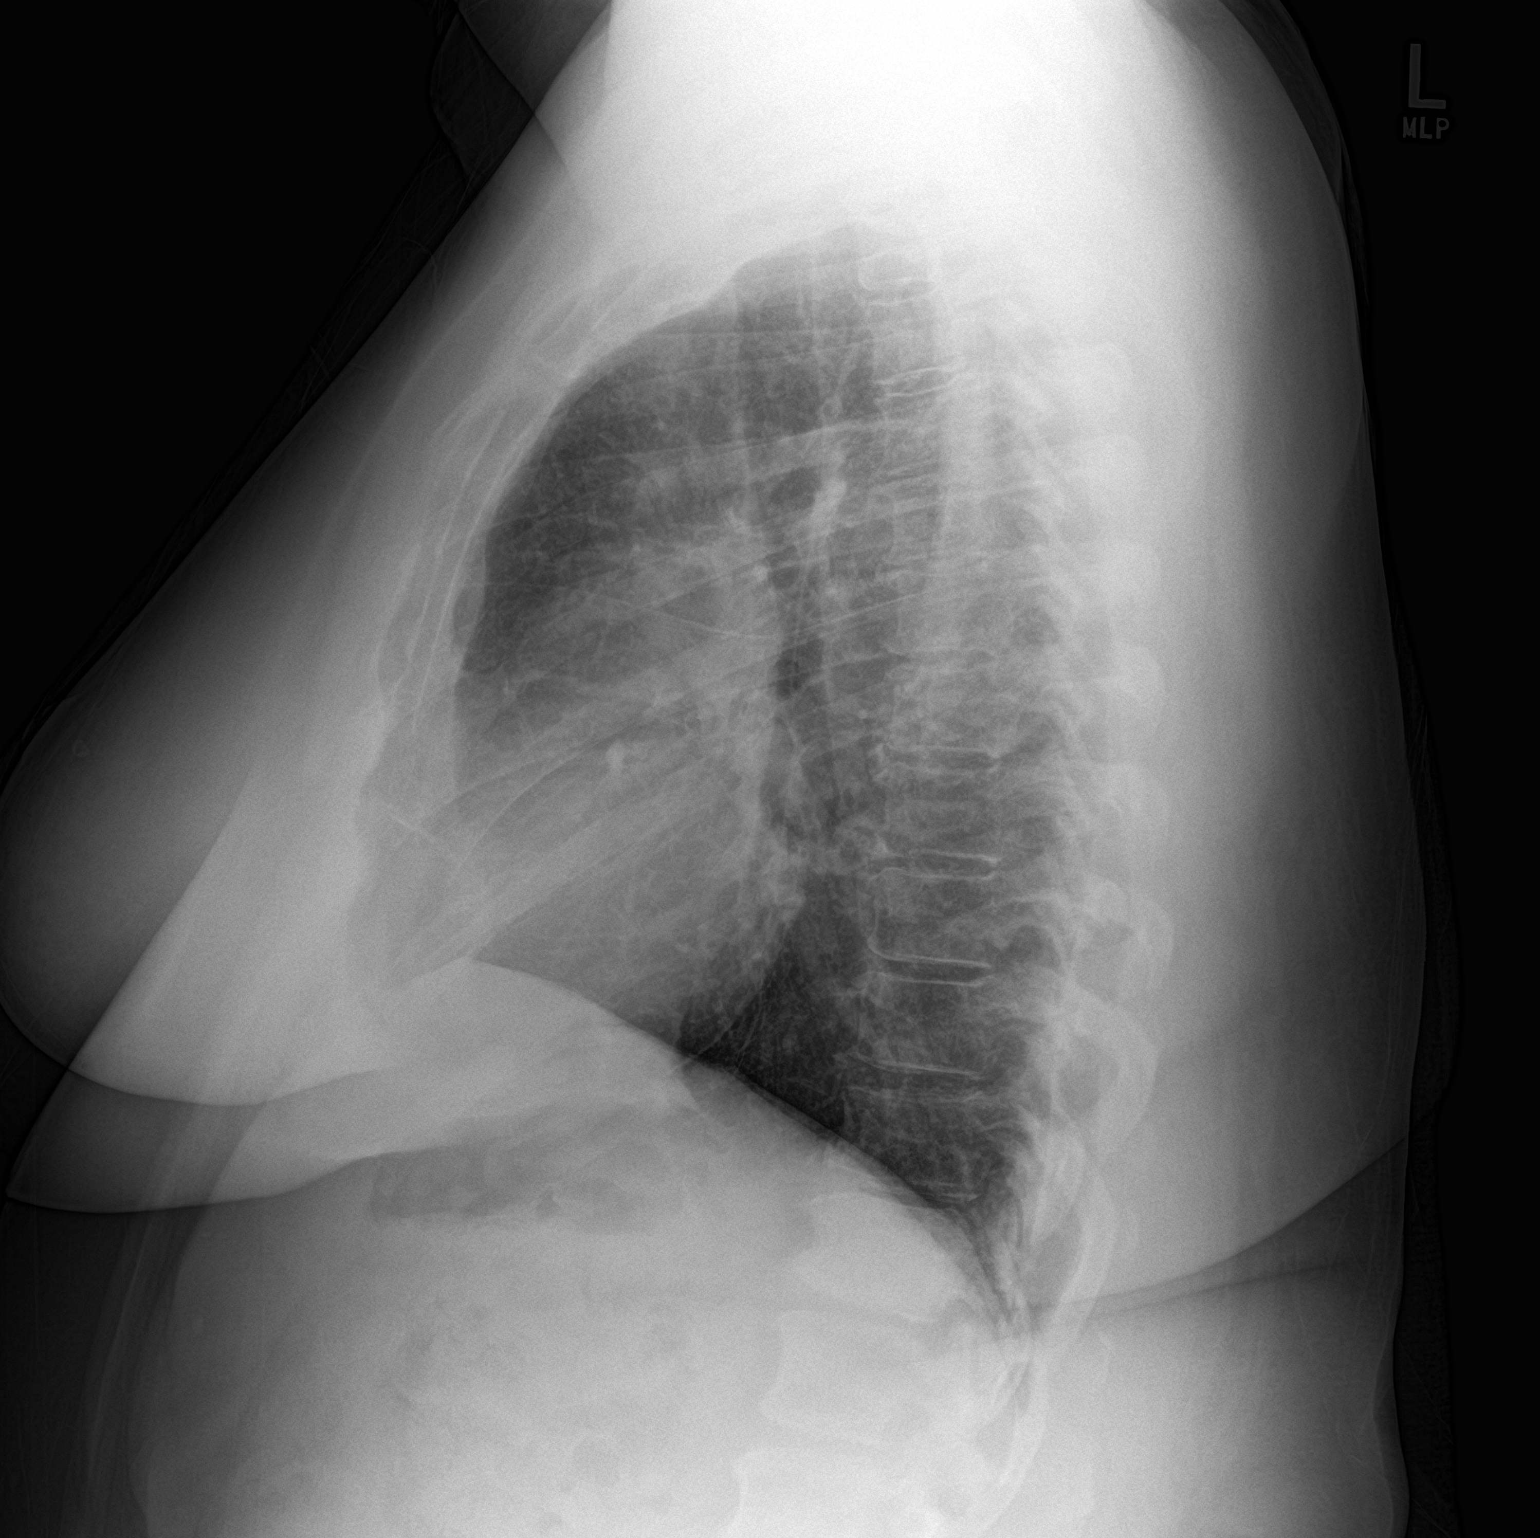

[2 of 2 positions shown; findings below may reference images not displayed]

FINDINGS: No active infiltrate or effusion is seen. Mediastinal and hilar
contours are unremarkable. The heart is within upper limits of
normal. No bony abnormality is seen.
IMPRESSION: No active cardiopulmonary disease.

## 2018-03-04 DIAGNOSIS — H00014 Hordeolum externum left upper eyelid: Secondary | ICD-10-CM | POA: Diagnosis not present

## 2018-03-04 DIAGNOSIS — E119 Type 2 diabetes mellitus without complications: Secondary | ICD-10-CM | POA: Diagnosis not present

## 2018-06-02 ENCOUNTER — Other Ambulatory Visit: Payer: Self-pay | Admitting: Family Medicine

## 2018-06-02 DIAGNOSIS — Z1231 Encounter for screening mammogram for malignant neoplasm of breast: Secondary | ICD-10-CM

## 2018-07-21 ENCOUNTER — Ambulatory Visit: Payer: Self-pay

## 2018-07-24 DIAGNOSIS — I2510 Atherosclerotic heart disease of native coronary artery without angina pectoris: Secondary | ICD-10-CM | POA: Diagnosis not present

## 2018-07-24 DIAGNOSIS — Z79899 Other long term (current) drug therapy: Secondary | ICD-10-CM | POA: Diagnosis not present

## 2018-07-24 DIAGNOSIS — M4316 Spondylolisthesis, lumbar region: Secondary | ICD-10-CM | POA: Diagnosis not present

## 2018-07-24 DIAGNOSIS — E7800 Pure hypercholesterolemia, unspecified: Secondary | ICD-10-CM | POA: Diagnosis not present

## 2018-07-24 DIAGNOSIS — E785 Hyperlipidemia, unspecified: Secondary | ICD-10-CM | POA: Diagnosis not present

## 2018-07-24 DIAGNOSIS — Z1231 Encounter for screening mammogram for malignant neoplasm of breast: Secondary | ICD-10-CM | POA: Diagnosis not present

## 2018-07-24 DIAGNOSIS — E7849 Other hyperlipidemia: Secondary | ICD-10-CM | POA: Diagnosis not present

## 2018-07-24 DIAGNOSIS — I340 Nonrheumatic mitral (valve) insufficiency: Secondary | ICD-10-CM | POA: Diagnosis not present

## 2018-07-24 DIAGNOSIS — E119 Type 2 diabetes mellitus without complications: Secondary | ICD-10-CM | POA: Diagnosis not present

## 2018-07-24 DIAGNOSIS — Z952 Presence of prosthetic heart valve: Secondary | ICD-10-CM | POA: Diagnosis not present

## 2018-07-24 DIAGNOSIS — M533 Sacrococcygeal disorders, not elsewhere classified: Secondary | ICD-10-CM | POA: Diagnosis not present

## 2018-07-24 DIAGNOSIS — I5022 Chronic systolic (congestive) heart failure: Secondary | ICD-10-CM | POA: Diagnosis not present

## 2018-07-24 DIAGNOSIS — R972 Elevated prostate specific antigen [PSA]: Secondary | ICD-10-CM | POA: Diagnosis not present

## 2018-07-24 DIAGNOSIS — I10 Essential (primary) hypertension: Secondary | ICD-10-CM | POA: Diagnosis not present

## 2018-07-24 DIAGNOSIS — E039 Hypothyroidism, unspecified: Secondary | ICD-10-CM | POA: Diagnosis not present

## 2018-07-24 DIAGNOSIS — G8929 Other chronic pain: Secondary | ICD-10-CM | POA: Diagnosis not present

## 2018-07-28 ENCOUNTER — Ambulatory Visit: Payer: Self-pay | Admitting: Obstetrics

## 2018-07-31 DIAGNOSIS — E119 Type 2 diabetes mellitus without complications: Secondary | ICD-10-CM | POA: Diagnosis not present

## 2018-07-31 DIAGNOSIS — I10 Essential (primary) hypertension: Secondary | ICD-10-CM | POA: Diagnosis not present

## 2018-07-31 DIAGNOSIS — E7800 Pure hypercholesterolemia, unspecified: Secondary | ICD-10-CM | POA: Diagnosis not present

## 2018-08-01 ENCOUNTER — Inpatient Hospital Stay: Admission: RE | Admit: 2018-08-01 | Payer: Self-pay | Source: Ambulatory Visit

## 2018-08-05 ENCOUNTER — Ambulatory Visit: Payer: Self-pay | Admitting: Obstetrics

## 2018-08-06 ENCOUNTER — Ambulatory Visit: Payer: Self-pay | Admitting: Pulmonary Disease

## 2018-08-12 ENCOUNTER — Ambulatory Visit: Payer: Self-pay

## 2018-09-04 DIAGNOSIS — Z79899 Other long term (current) drug therapy: Secondary | ICD-10-CM | POA: Diagnosis not present

## 2018-09-04 DIAGNOSIS — E7800 Pure hypercholesterolemia, unspecified: Secondary | ICD-10-CM | POA: Diagnosis not present

## 2018-09-04 DIAGNOSIS — Z7984 Long term (current) use of oral hypoglycemic drugs: Secondary | ICD-10-CM | POA: Diagnosis not present

## 2018-09-04 DIAGNOSIS — E1122 Type 2 diabetes mellitus with diabetic chronic kidney disease: Secondary | ICD-10-CM | POA: Diagnosis not present

## 2018-09-04 DIAGNOSIS — E559 Vitamin D deficiency, unspecified: Secondary | ICD-10-CM | POA: Diagnosis not present

## 2018-09-09 DIAGNOSIS — Z6841 Body Mass Index (BMI) 40.0 and over, adult: Secondary | ICD-10-CM | POA: Diagnosis not present

## 2018-09-09 DIAGNOSIS — I10 Essential (primary) hypertension: Secondary | ICD-10-CM | POA: Diagnosis not present

## 2018-09-09 DIAGNOSIS — E7800 Pure hypercholesterolemia, unspecified: Secondary | ICD-10-CM | POA: Diagnosis not present

## 2018-09-09 DIAGNOSIS — K219 Gastro-esophageal reflux disease without esophagitis: Secondary | ICD-10-CM | POA: Diagnosis not present

## 2018-09-09 DIAGNOSIS — E559 Vitamin D deficiency, unspecified: Secondary | ICD-10-CM | POA: Diagnosis not present

## 2018-09-09 DIAGNOSIS — E1122 Type 2 diabetes mellitus with diabetic chronic kidney disease: Secondary | ICD-10-CM | POA: Diagnosis not present

## 2018-09-09 DIAGNOSIS — Z0000 Encounter for general adult medical examination without abnormal findings: Secondary | ICD-10-CM | POA: Diagnosis not present

## 2018-09-11 DIAGNOSIS — Z1159 Encounter for screening for other viral diseases: Secondary | ICD-10-CM | POA: Diagnosis not present

## 2018-09-29 ENCOUNTER — Ambulatory Visit
Admission: RE | Admit: 2018-09-29 | Discharge: 2018-09-29 | Disposition: A | Discharge status: Home / Self Care | Payer: Medicare Other | Source: Ambulatory Visit | Attending: Family Medicine | Admitting: Family Medicine

## 2018-09-29 ENCOUNTER — Other Ambulatory Visit: Payer: Self-pay

## 2018-09-29 DIAGNOSIS — Z1231 Encounter for screening mammogram for malignant neoplasm of breast: Secondary | ICD-10-CM | POA: Diagnosis not present

## 2018-10-03 ENCOUNTER — Telehealth: Payer: Self-pay | Admitting: *Deleted

## 2018-10-03 NOTE — Telephone Encounter (Signed)

## 2018-10-06 ENCOUNTER — Ambulatory Visit (INDEPENDENT_AMBULATORY_CARE_PROVIDER_SITE_OTHER)
Admission: RE | Admit: 2018-10-06 | Discharge: 2018-10-06 | Disposition: A | Discharge status: Home / Self Care | Payer: PPO | Source: Ambulatory Visit | Attending: Pulmonary Disease | Admitting: Pulmonary Disease

## 2018-10-06 ENCOUNTER — Other Ambulatory Visit: Payer: Self-pay

## 2018-10-06 DIAGNOSIS — R911 Solitary pulmonary nodule: Secondary | ICD-10-CM | POA: Diagnosis not present

## 2018-10-06 DIAGNOSIS — R918 Other nonspecific abnormal finding of lung field: Secondary | ICD-10-CM | POA: Diagnosis not present

## 2018-10-15 ENCOUNTER — Ambulatory Visit: Payer: Self-pay | Admitting: Primary Care

## 2018-10-16 ENCOUNTER — Ambulatory Visit (INDEPENDENT_AMBULATORY_CARE_PROVIDER_SITE_OTHER): Payer: PPO | Admitting: Primary Care

## 2018-10-16 ENCOUNTER — Other Ambulatory Visit: Payer: Self-pay

## 2018-10-16 ENCOUNTER — Encounter: Payer: Self-pay | Admitting: Primary Care

## 2018-10-16 VITALS — BP 120/70 | HR 100 | Temp 98.2°F | Ht 66.5 in | Wt 252.2 lb

## 2018-10-16 DIAGNOSIS — J438 Other emphysema: Secondary | ICD-10-CM | POA: Diagnosis not present

## 2018-10-16 DIAGNOSIS — I7090 Unspecified atherosclerosis: Secondary | ICD-10-CM | POA: Diagnosis not present

## 2018-10-16 DIAGNOSIS — R918 Other nonspecific abnormal finding of lung field: Secondary | ICD-10-CM | POA: Diagnosis not present

## 2018-10-16 DIAGNOSIS — Z720 Tobacco use: Secondary | ICD-10-CM | POA: Diagnosis not present

## 2018-10-16 DIAGNOSIS — R911 Solitary pulmonary nodule: Secondary | ICD-10-CM | POA: Diagnosis not present

## 2018-10-16 NOTE — Assessment & Plan Note (Addendum)
-   Current tobacco use (10 cigarettes/day) - Strongly encouraged patient quit smoking - Using nicotine patches and on wellbutrin to cut back  - Refer to lung cancer screening program for annual LD-CT starting June 2021

## 2018-10-16 NOTE — Assessment & Plan Note (Addendum)
-   CT chest 10/07/18 showed Coronary artery calcification and Aortic Atherosclerosis - Continue Zetia 10mg  daily  - Encourage weight loss, diet low in saturated fat and regular exercise - Follow up with PCP

## 2018-10-16 NOTE — Patient Instructions (Addendum)
STOP SMOKING TODAY!!!!  Encourage regular physical exercise (10-42mins bicycle three times a week)  Follow up in 1 year with Dr. Vaughan Browner OR sooner if needed      Coronary Artery Disease, Female  Coronary artery disease (CAD) is a condition in which the arteries that lead to the heart (coronary arteries) become narrow or blocked. The narrowing or blockage can lead to decreased blood flow to the heart. Prolonged reduced blood flow can cause a heart attack (myocardial infarction or MI). This condition may also be called coronary heart disease. Because CAD is the leading cause of death in women, it is important to understand what causes this condition and how it is treated. What are the causes? CAD is most often caused by atherosclerosis. This is the buildup of fat and cholesterol (plaque) on the inside of the arteries. Over time, the plaque may narrow or block the artery, reducing blood flow to the heart. Plaque can also become weak and break off within a coronary artery and cause a sudden blockage. Other less common causes of CAD include:  An embolism or blood clot in a coronary artery.  A tearing of the artery (spontaneous coronary artery dissection).  An aneurysm.  Inflammation (vasculitis) in the artery wall. What increases the risk? The following factors may make you more likely to develop this condition:  Age. Women over age 40 are at a greater risk of CAD.  Family history of CAD.  High blood pressure (hypertension).  Diabetes.  High cholesterol levels.  Tobacco use.  Lack of exercise.  Menopause. ? All postmenopausal women are at greater risk of CAD. ? Women who have experienced menopause between the ages of 17-45 (early menopause) are at a higher risk of CAD. ? Women who have experienced menopause before age 14 (premature menopause) are at a very high risk of CAD.  Excessive alcohol use  A diet high in saturated and trans fats, such as fried food and processed meat.  Other possible risk factors include:  High stress levels.  Depression  Obesity.  Sleep apnea. What are the signs or symptoms? Many people do not have any symptoms during the early stages of CAD. As the condition progresses, symptoms may include:  Chest pain (angina). The pain can: ? Feel like crushing or squeezing, or a tightness, pressure, fullness, or heaviness in the chest. ? Last more than a few minutes or can stop and recur. The pain tends to get worse with exercise or stress and to fade with rest.  Pain in the arms, neck, jaw, or back.  Unexplained heartburn or indigestion.  Shortness of breath.  Nausea.  Sudden cold sweats.  Sudden light-headedness.  Fluttering or fast heartbeat (palpitations). Many women have chest discomfort and the other symptoms. However, women often have unusual (atypical) symptoms, such as:  Fatigue.  Vomiting.  Unexplained feelings of nervousness or anxiety.  Unexplained weakness.  Dizziness or fainting. How is this diagnosed? This condition is diagnosed based on:  Your family and medical history.  A physical exam.  Tests, including: ? A test to check the electrical signals in your heart (electrocardiogram). ? Exercise stress test. This looks for signs of blockage when the heart is stressed with exercise, such as running on a treadmill. ? Pharmacologic stress test. This test looks for signs of blockage when the heart is being stressed with a medicine. ? Blood tests. ? Coronary angiogram. This is a procedure to look at the coronary arteries to see if there is any blockage.  During this test, a dye is injected into your arteries so they appear on an X-ray. ? A test that uses sound waves to take a picture of your heart (echocardiogram). ? Chest X-ray. How is this treated? This condition may be treated by:  Healthy lifestyle changes to reduce risk factors.  Medicines such as: ? Antiplatelet medicines and blood-thinning medicines,  such as aspirin. These help prevent blood clots. ? Nitroglycerin. ? Blood pressure medicines. ? Cholesterol-lowering medicine.  Coronary angioplasty and stenting. During this procedure, a thin, flexible tube is inserted through a blood vessel and into a blocked artery. A balloon or similar device on the end of the tube is inflated to open up the artery. In some cases, a small, mesh tube (stent) is inserted into the artery to keep it open.  Coronary artery bypass surgery. During this surgery, veins or arteries from other parts of the body are used to create a bypass around the blockage and allow blood to reach your heart. Follow these instructions at home: Medicines  Take over-the-counter and prescription medicines only as told by your health care provider.  Do not take the following medicines unless your health care provider approves: ? NSAIDs, such as ibuprofen, naproxen, or celecoxib. ? Vitamin supplements that contain vitamin A, vitamin E, or both. ? Hormone replacement therapy that contains estrogen with or without progestin. Lifestyle  Follow an exercise program approved by your health care provider. Aim for 150 minutes of moderate exercise or 75 minutes of vigorous exercise each week.  Maintain a healthy weight or lose weight as approved by your health care provider.  Rest when you are tired.  Learn to manage stress or try to limit your stress. Ask your health care provider for suggestions if you need help.  Get screened for depression and seek treatment, if needed.  Do not use any products that contain nicotine or tobacco, such as cigarettes and e-cigarettes. If you need help quitting, ask your health care provider.  Do not use illegal drugs. Eating and drinking  Follow a heart-healthy diet. A dietitian can help educate you about healthy food options and changes. In general, eat plenty of fruits and vegetables, lean meats, and whole grains.  Avoid foods high in: ? Sugar.  ? Salt (sodium). ? Saturated fats, such as processed or fatty meat. ? Trans fats, such as fried food.  Use healthy cooking methods such as roasting, grilling, broiling, baking, poaching, steaming, or stir-frying.  If you drink alcohol, and your health care provider approves, limit your alcohol intake to no more than 1 drink per day. One drink equals 12 ounces of beer, 5 ounces of wine, or 1 ounces of hard liquor. General instructions  Manage any other health conditions, such as hypertension and diabetes. These conditions affect your heart.  Your health care provider may ask you to monitor your blood pressure. Ideally, your blood pressure should be below 130/80.  Keep all follow-up visits as told by your health care provider. This is important. Get help right away if:  You have pain in your chest, neck, arm, jaw, stomach, or back that: ? Lasts more than a few minutes. ? Is recurring. ? Is not relieved by taking medicine under your tongue (sublingualnitroglycerin).  You have profuse sweating without cause.  You have unexplained: ? Heartburn or indigestion. ? Shortness of breath or difficulty breathing. ? Fluttering or fast heartbeat (palpitations). ? Nausea or vomiting. ? Fatigue. ? Feelings of nervousness or anxiety. ? Weakness. ? Diarrhea.  You have sudden light-headedness or dizziness.  You faint.  You feel like hurting yourself or think about taking your own life. These symptoms may represent a serious problem that is an emergency. Do not wait to see if the symptoms will go away. Get medical help right away. Call your local emergency services (911 in the [place] Do not drive yourself to the hospital. Summary  Coronary artery disease (CAD) is a process in which the arteries that lead to the heart (coronary arteries) become narrow or blocked. The narrowing or blockage can lead to a heart attack.  Many women have chest discomfort and other common symptoms of CAD. However,  women often have different (atypical) symptoms, such as fatigue, vomiting, and dizziness or weakness.  CAD can be treated with lifestyle changes, medicines, surgery, or a combination of these treatments. This information is not intended to replace advice given to you by your health care provider. Make sure you discuss any questions you have with your health care provider. Document Released: 07/02/2011 Document Revised: 03/30/2016 Document Reviewed: 03/30/2016 Elsevier Interactive Patient Education  2019 [employer].    Steps to Quit Smoking  Smoking tobacco can be bad for your health. It can also affect almost every organ in your body. Smoking puts you and people around you at risk for many serious long-lasting (chronic) diseases. Quitting smoking is hard, but it is one of the best things that you can do for your health. It is never too late to quit. What are the benefits of quitting smoking? When you quit smoking, you lower your risk for getting serious diseases and conditions. They can include:  Lung cancer or lung disease.  Heart disease.  Stroke.  Heart attack.  Not being able to have children (infertility).  Weak bones (osteoporosis) and broken bones (fractures). If you have coughing, wheezing, and shortness of breath, those symptoms may get better when you quit. You may also get sick less often. If you are pregnant, quitting smoking can help to lower your chances of having a baby of low birth weight. What can I do to help me quit smoking? Talk with your doctor about what can help you quit smoking. Some things you can do (strategies) include:  Quitting smoking totally, instead of slowly cutting back how much you smoke over a period of time.  Going to in-person counseling. You are more likely to quit if you go to many counseling sessions.  Using resources and support systems, such as: ? Database administrator with a Social worker. ? Phone quitlines. ? Careers information officer. ?  Support groups or group counseling. ? Text messaging programs. ? Mobile phone apps or applications.  Taking medicines. Some of these medicines may have nicotine in them. If you are pregnant or breastfeeding, do not take any medicines to quit smoking unless your doctor says it is okay. Talk with your doctor about counseling or other things that can help you. Talk with your doctor about using more than one strategy at the same time, such as taking medicines while you are also going to in-person counseling. This can help make quitting easier. What things can I do to make it easier to quit? Quitting smoking might feel very hard at first, but there is a lot that you can do to make it easier. Take these steps:  Talk to your family and friends. Ask them to support and encourage you.  Call phone quitlines, reach out to support groups, or work with a Social worker.  Ask people who  smoke to not smoke around you.  Avoid places that make you want (trigger) to smoke, such as: ? Bars. ? Parties. ? Smoke-break areas at work.  Spend time with people who do not smoke.  Lower the stress in your life. Stress can make you want to smoke. Try these things to help your stress: ? Getting regular exercise. ? Deep-breathing exercises. ? Yoga. ? Meditating. ? Doing a body scan. To do this, close your eyes, focus on one area of your body at a time from head to toe, and notice which parts of your body are tense. Try to relax the muscles in those areas.  Download or buy apps on your mobile phone or tablet that can help you stick to your quit plan. There are many free apps, such as QuitGuide from the [employer] Office manager for Disease Control and Prevention). You can find more support from smokefree.gov and other websites. This information is not intended to replace advice given to you by your health care provider. Make sure you discuss any questions you have with your health care provider. Document Released: 02/03/2009 Document  Revised: 12/06/2015 Document Reviewed: 08/24/2014 Elsevier Interactive Patient Education  2019 [employer].

## 2018-10-16 NOTE — Assessment & Plan Note (Addendum)
-   Asymptomatic, continue to monitor symptoms off inhalers

## 2018-10-16 NOTE — Progress Notes (Signed)
_0  ID: Stephanie Fox, female    DOB: 22-Mar-1951, 68 y.o.   MRN: [id number]  Chief Complaint  Patient presents with  . Follow-up    follow ct scan    Referring provider: Jonathon Jordan, MD  HPI: 68 year old female, current every day smoker. PMH significant for HTN, hepatic cyst, type 2 diabetes, COPD/emphysema, lung nodule. Patient of Dr. Vaughan Browner, last seen on 10/29/17.  CT chest 07/2017 showed 108m RUL nodule and mild emphysematous changes. PFTs showed no overt obstruction but there is a curvature to flow loop and reduction in mid-flow suggestive of small airway disease. Not on any maintenance inhalers.   10/16/2018 Patient presents today for annual follow-up. She is feeling well, no acute complaints. Denies shortness of breath. Gets fatigued with activity. She is still smoking 10 cigarettes a day, trying to reduce her use with nicotine patches and is on wellbutrin. States that she is smoking more since retirement. CT chest 10/07/18 showed stable 44mright upper lobe pulmonary nodule considered benign. Incidental findings coronary artery calcification and aortic atherosclerosis. Needs to follow up with lung cancer screening clinic to start annual low-dose screening.     Data Reviewed: CT chest 10/07/18- 4 mm RIGHT upper lobe pulmonary nodule unchanged from comparison exam. No new pulmonary nodules. Coronary artery calcification and Aortic Atherosclerosis  CT chest 08/16/2017- mild centrilobular and paraseptal emphysema.  Mild scarring in the right middle lobe, lingula, lower lobes.  4 mm right upper lobe nodule.  PFTs 10/29/2017 FVC 2.17 [79%], FEV1 1.63 [76%], F/F 75, TLC 84%, DLCO 50% Minimal obstruction, moderate-severe diffusion defect  No Known Allergies  Immunization History  Administered Date(s) Administered  . Influenza, Seasonal, Injecte, Preservative Fre 02/04/2017  . Pneumococcal Conjugate-13 08/07/2017  . Pneumococcal Polysaccharide-23 02/28/2014    Past Medical History:   Diagnosis Date  . Anal or rectal pain    per pt / had 3 times  in last 3 years  . Anemia    as a child   . Arthritis   . DDD (degenerative disc disease), lumbosacral    l4-5, S1  . Diabetes [place]   type II   . Dyspnea    with exertion   . Edema    lower extremities   . GERD (gastroesophageal reflux disease)    in past  . History of kidney stones   . Hyperlipidemia   . Hypertension   . Low back pain   . Spinal stenosis     Tobacco History: Social History   Tobacco Use  Smoking Status Current Every Day Smoker  . Packs/day: 0.50  . Types: Cigarettes  Smokeless Tobacco Never Used   Ready to quit: Not Answered Counseling given: Not Answered   Outpatient Medications Prior to Visit  Medication Sig Dispense Refill  . benzonatate (TESSALON) 100 MG capsule Take 100 mg by mouth 2 (two) times daily as needed for cough.     . Marland KitchenuPROPion (WELLBUTRIN XL) 150 MG 24 hr tablet Take 150 mg by mouth daily.    . Cholecalciferol (VITAMIN D3) 2000 UNITS TABS Take 2,000 Units by mouth daily.     . Marland Kitchenzetimibe (ZETIA) 10 MG tablet Take 10 mg by mouth daily.  6  . furosemide (LASIX) 40 MG tablet Take 40 mg by mouth daily as needed for fluid.     . Marland Kitchenlimepiride (AMARYL) 1 MG tablet Take 1 mg by mouth as needed.    . Marland KitchenYDROcodone-acetaminophen (NORCO/VICODIN) 5-325 MG tablet Take 1-2 tablets by mouth  every 4 (four) hours as needed for moderate pain or severe pain. 30 tablet 0  . Liraglutide (VICTOZA) 18 MG/3ML SOPN Inject 0.6 mg into the skin daily.     . metFORMIN (GLUCOPHAGE) 500 MG tablet Take 500 mg by mouth 2 (two) times daily with a meal.    . Multiple Vitamin (MULTIVITAMIN) tablet Take 1 tablet by mouth daily. Reported on 07/21/2015    . Nicotine 21-14-7 MG/24HR KIT USE AS DIRECTED 56 each 0  . valsartan-hydrochlorothiazide (DIOVAN-HCT) 80-12.5 MG per tablet Take 1 tablet by mouth daily.    . vitamin E 400 UNIT capsule Take 400 Units by mouth 2 (two) times daily.      No  facility-administered medications prior to visit.    Review of Systems  Review of Systems  Constitutional: Negative.   HENT: Negative.   Respiratory: Negative for cough, shortness of breath and wheezing.   Cardiovascular: Negative.    Physical Exam  BP 120/70 (BP Location: Left Arm, Cuff Size: Normal)   Pulse 100   Temp 98.2 F (36.8 C)   Ht 5' 6.5" (1.689 m)   Wt 252 lb 3.2 oz (114.4 kg)   SpO2 97%   BMI 40.10 kg/m  Physical Exam Constitutional:      General: She is not in acute distress.    Appearance: Normal appearance. She is obese. She is not ill-appearing.  HENT:     Head: Normocephalic and atraumatic.     Mouth/Throat:     Mouth: Mucous membranes are moist.     Pharynx: Oropharynx is clear.  Neck:     Musculoskeletal: Normal range of motion and neck supple.  Cardiovascular:     Pulses: Normal pulses.     Heart sounds: Normal heart sounds.  Pulmonary:     Effort: Pulmonary effort is normal.     Breath sounds: Normal breath sounds. No wheezing or rhonchi.  Skin:    General: Skin is warm and dry.  Neurological:     General: No focal deficit present.     Mental Status: She is alert and oriented to person, place, and time. Mental status is at baseline.  Psychiatric:        Mood and Affect: Mood normal.        Behavior: Behavior normal.        Thought Content: Thought content normal.        Judgment: Judgment normal.      Lab Results:  CBC    Component Value Date/Time   WBC 10.5 12/06/2017 0423   RBC 3.98 12/06/2017 0423   HGB 12.0 12/06/2017 0423   HCT 36.7 12/06/2017 0423   PLT 287 12/06/2017 0423   MCV 92.2 12/06/2017 0423   MCH 30.2 12/06/2017 0423   MCHC 32.7 12/06/2017 0423   RDW 14.0 12/06/2017 0423   LYMPHSABS 2.2 12/06/2017 0423   MONOABS 1.0 12/06/2017 0423   EOSABS 0.1 12/06/2017 0423   BASOSABS 0.1 12/06/2017 0423    BMET    Component Value Date/Time   NA 135 12/05/2017 1530   K 3.7 12/05/2017 1530   CL 99 12/05/2017 1530   CO2  29 12/05/2017 1530   GLUCOSE 117 (H) 12/05/2017 1530   BUN 13 12/05/2017 1530   CREATININE 0.77 12/05/2017 1530   CALCIUM 9.2 12/05/2017 1530   GFRNONAA >60 12/05/2017 1530   GFRAA >60 12/05/2017 1530    BNP No results found for: BNP  ProBNP No results found for: PROBNP  Imaging: Ct Chest  Wo Contrast  Result Date: 10/07/2018 CLINICAL DATA:  Follow-up lung nodule.  RIGHT middle lobe nodule EXAM: CT CHEST WITHOUT CONTRAST TECHNIQUE: Multidetector CT imaging of the chest was performed following the standard protocol without IV contrast. COMPARISON:  CT 08/16/2017 FINDINGS: Cardiovascular: Coronary artery calcification and aortic atherosclerotic calcification. Mediastinum/Nodes: No axillary or supraclavicular adenopathy. No mediastinal. No pericardial. Trachea and esophagus normal Lungs/Pleura: 4 mm RIGHT upper lobe pulmonary nodule unchanged from comparison exam. No new pulmonary nodules. Upper Abdomen: Multiple benign hepatic cysts unchanged. Musculoskeletal: No aggressive osseous lesion. IMPRESSION: 1. Stable RIGHT upper lobe pulmonary nodule over 1 year consistent with benign etiology per Fleischner criteria. 2. Coronary artery calcification and Aortic Atherosclerosis (ICD10-I70.0). Electronically Signed   By: Suzy Bouchard M.D.   On: 10/07/2018 08:05   Mm 3d Screen Breast Bilateral  Result Date: 09/29/2018 CLINICAL DATA:  Screening. EXAM: DIGITAL SCREENING BILATERAL MAMMOGRAM WITH TOMO AND CAD COMPARISON:  Previous exam(s). ACR Breast Density Category b: There are scattered areas of fibroglandular density. FINDINGS: There are no findings suspicious for malignancy. Images were processed with CAD. IMPRESSION: No mammographic evidence of malignancy. A result letter of this screening mammogram will be mailed directly to the patient. RECOMMENDATION: Screening mammogram in one year. (Code:[id number]-01Y) BI-RADS CATEGORY  1: Negative. Electronically Signed   By: Claudie Revering M.D.   On: 09/29/2018 15:43      Assessment & Plan:   Other emphysema ([place]) - Asymptomatic, continue to monitor symptoms off inhalers   Solitary pulmonary nodule - CT chest 10/07/18- 4 mm RIGHT upper lobe pulmonary nodule unchanged from comparison exam and considered benign. No new pulmonary nodules.  Tobacco use - Current tobacco use (10 cigarettes/day) - Strongly encouraged patient quit smoking - Using nicotine patches and on wellbutrin to cut back  - Refer to lung cancer screening program for annual LD-CT starting June 2021  Atherosclerosis - CT chest 10/07/18 showed Coronary artery calcification and Aortic Atherosclerosis - Continue Zetia 74m daily  - Encourage weight loss, diet low in saturated fat and regular exercise - Follow up with PCP    EMartyn Ehrich NP 10/16/2018

## 2018-10-16 NOTE — Assessment & Plan Note (Addendum)
-   CT chest 10/07/18- 4 mm RIGHT upper lobe pulmonary nodule unchanged from comparison exam and considered benign. No new pulmonary nodules.

## 2018-11-19 ENCOUNTER — Other Ambulatory Visit: Payer: Self-pay

## 2018-11-19 ENCOUNTER — Ambulatory Visit (INDEPENDENT_AMBULATORY_CARE_PROVIDER_SITE_OTHER): Payer: PPO | Admitting: Obstetrics

## 2018-11-19 ENCOUNTER — Encounter: Payer: Self-pay | Admitting: Obstetrics

## 2018-11-19 ENCOUNTER — Other Ambulatory Visit (HOSPITAL_COMMUNITY)
Admission: RE | Admit: 2018-11-19 | Discharge: 2018-11-19 | Disposition: A | Discharge status: Home / Self Care | Payer: PPO | Source: Ambulatory Visit | Attending: Obstetrics | Admitting: Obstetrics

## 2018-11-19 VITALS — BP 129/78 | HR 102 | Wt 254.3 lb

## 2018-11-19 DIAGNOSIS — Z780 Asymptomatic menopausal state: Secondary | ICD-10-CM | POA: Insufficient documentation

## 2018-11-19 DIAGNOSIS — N898 Other specified noninflammatory disorders of vagina: Secondary | ICD-10-CM | POA: Insufficient documentation

## 2018-11-19 DIAGNOSIS — F17210 Nicotine dependence, cigarettes, uncomplicated: Secondary | ICD-10-CM

## 2018-11-19 DIAGNOSIS — Z01419 Encounter for gynecological examination (general) (routine) without abnormal findings: Secondary | ICD-10-CM | POA: Insufficient documentation

## 2018-11-19 DIAGNOSIS — Z1151 Encounter for screening for human papillomavirus (HPV): Secondary | ICD-10-CM | POA: Diagnosis not present

## 2018-11-19 DIAGNOSIS — Z6841 Body Mass Index (BMI) 40.0 and over, adult: Secondary | ICD-10-CM

## 2018-11-19 NOTE — Progress Notes (Signed)
Pt is here for annual exam. Last pap 07/25/2017, normal. MMG last month, normal per patient. Colonoscopy current.

## 2018-11-19 NOTE — Progress Notes (Signed)
Subjective:        Stephanie Fox is a 68 y.o. female here for a routine exam.  Current complaints: None.    Personal health questionnaire:  Is patient Stephanie Fox, have a family history of breast and/or ovarian cancer: no Is there a family history of uterine cancer diagnosed at age < 55, gastrointestinal cancer, urinary tract cancer, family member who is a Field seismologist syndrome-associated carrier: no Is the patient overweight and hypertensive, family history of diabetes, personal history of gestational diabetes, preeclampsia or PCOS: yes Is patient over 72, have PCOS,  family history of premature CHD under age 89, diabetes, smoke, have hypertension or peripheral artery disease:  yes At any time, has a partner hit, kicked or otherwise hurt or frightened you?: no Over the past 2 weeks, have you felt down, depressed or hopeless?: no Over the past 2 weeks, have you felt little interest or pleasure in doing things?:no   Gynecologic History No LMP recorded. Patient is postmenopausal. Contraception: post menopausal status Last Pap: 07-25-2017. Results were: normal Last mammogram: 09-29-2018. Results were: normal  Obstetric History OB History  Gravida Para Term Preterm AB Living  2 0 0 0 2 0  SAB TAB Ectopic Multiple Live Births  0 2 0 0      # Outcome Date GA Lbr Len/2nd Weight Sex Delivery Anes PTL Lv  2 TAB           1 TAB             Past Medical History:  Diagnosis Date  . Anal or rectal pain    per pt / had 3 times  in last 3 years  . Anemia    as a child   . Arthritis   . DDD (degenerative disc disease), lumbosacral    l4-5, S1  . Diabetes mellitus ([place])    type II   . Dyspnea    with exertion   . Edema    lower extremities   . GERD (gastroesophageal reflux disease)    in past  . History of kidney stones   . Hyperlipidemia   . Hypertension   . Low back pain   . Spinal stenosis     Past Surgical History:  Procedure Laterality Date  . BREAST REDUCTION SURGERY   12/2002   Bil  . KNEE SURGERY    . LUMBAR EPIDURAL INJECTION     In Dec 2018,had 4 injection  . REDUCTION MAMMAPLASTY Bilateral 15 years ago  . thumb surgery  1990   right thumb  . 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 underarm surgery  1980's   gland under right arm got infected due to shaving     Current Outpatient Medications:  .  ASPIRIN 81 PO, aspirin 81 mg tablet,delayed release  Take 1 tablet every day by oral route., Disp: , Rfl:  .  benzonatate (TESSALON) 100 MG capsule, Take 100 mg by mouth 2 (two) times daily as needed for cough. , Disp: , Rfl:  .  buPROPion (WELLBUTRIN XL) 150 MG 24 hr tablet, Take 150 mg by mouth daily., Disp: , Rfl:  .  Cholecalciferol (VITAMIN D3) 2000 UNITS TABS, Take 2,000 Units by mouth daily. , Disp: , Rfl:  .  ezetimibe (ZETIA) 10 MG tablet, Take 10 mg by mouth daily., Disp: , Rfl: 6 .  furosemide (LASIX) 40 MG tablet, Take 40 mg by mouth daily as needed for fluid. , Disp: , Rfl:  .  glimepiride (AMARYL) 1 MG tablet, Take 1 mg by mouth as needed., Disp: , Rfl:  .  Liraglutide (VICTOZA) 18 MG/3ML SOPN, Inject 0.6 mg into the skin daily. , Disp: , Rfl:  .  metFORMIN (GLUCOPHAGE) 500 MG tablet, Take 500 mg by mouth 2 (two) times daily with a meal., Disp: , Rfl:  .  Multiple Vitamin (MULTIVITAMIN) tablet, Take 1 tablet by mouth daily. Reported on 07/21/2015, Disp: , Rfl:  .  valsartan-hydrochlorothiazide (DIOVAN-HCT) 80-12.5 MG per tablet, Take 1 tablet by mouth daily., Disp: , Rfl:  .  vitamin E 400 UNIT capsule, Take 400 Units by mouth 2 (two) times daily. , Disp: , Rfl:  .  HYDROcodone-acetaminophen (NORCO/VICODIN) 5-325 MG tablet, Take 1-2 tablets by mouth every 4 (four) hours as needed for moderate pain or severe pain. (Patient not taking: Reported on 11/19/2018), Disp: 30 tablet, Rfl:  0 .  Nicotine 21-14-7 MG/24HR KIT, USE AS DIRECTED (Patient not taking: Reported on 11/19/2018), Disp: 46 each, Rfl: 0 No Known Allergies  Social History   Tobacco Use  . Smoking status: Current Every Day Smoker    Packs/day: 0.50    Types: Cigarettes  . Smokeless tobacco: Never Used  Substance Use Topics  . Alcohol use: No    Family History  Problem Relation Age of Onset  . Colon cancer Neg Hx       Review of Systems  Constitutional: negative for fatigue and weight loss Respiratory: negative for cough and wheezing Cardiovascular: negative for chest pain, fatigue and palpitations Gastrointestinal: negative for abdominal pain and change in bowel habits Musculoskeletal:negative for myalgias Neurological: negative for gait problems and tremors Behavioral/Psych: negative for abusive relationship, depression Endocrine: negative for temperature intolerance    Genitourinary:negative for abnormal menstrual periods, genital lesions, hot flashes, sexual problems and vaginal discharge Integument/breast: negative for breast lump, breast tenderness, nipple discharge and skin lesion(s)    Objective:       BP 129/78   Pulse (!) 102   Wt 254 lb 4.8 oz (115.3 kg)   BMI 40.43 kg/m  General:   alert  Skin:   no rash or abnormalities  Lungs:   clear to auscultation bilaterally  Heart:   regular rate and rhythm, S1, S2 normal, no murmur, click, rub or gallop  Breasts:   normal without suspicious masses, skin or nipple changes or axillary nodes  Abdomen:  normal findings: no organomegaly, soft, non-tender and no hernia  Pelvis:  External genitalia: normal general appearance Urinary system: urethral meatus normal and bladder without fullness, nontender Vaginal: normal without tenderness, induration or masses Cervix: normal appearance Adnexa: normal bimanual exam Uterus: anteverted and non-tender, normal size   Lab Review Urine pregnancy test Labs reviewed yes Radiologic studies  reviewed yes  50% of 25 min visit spent on counseling and coordination of care.   Assessment:     1. Encounter for routine gynecological examination with Papanicolaou smear of cervix Rx: - Cytology - PAP( Stephanie Fox)  2. Vaginal discharge Rx: - Cervicovaginal ancillary only  3. Postmenopause  4. Tobacco dependence due to cigarettes - program of medication and behavioral modification recommended  5. Class 3 severe obesity due to excess calories without serious comorbidity with body mass index (BMI) of 40.0 to 44.9 in adult [facility]) - program of caloric reduction, exercise and behavioral modification recommended   Plan:  Education reviewed: calcium supplements, depression evaluation, low fat, low cholesterol diet, safe sex/STD prevention, self breast exams, smoking cessation and weight bearing exercise. Follow up in: 2 years.   No orders of the defined types were placed in this encounter.  No orders of the defined types were placed in this encounter.   Shelly Bombard MD 11-19-2018

## 2018-11-20 LAB — CERVICOVAGINAL ANCILLARY ONLY
Bacterial vaginitis: NEGATIVE
Candida vaginitis: NEGATIVE

## 2018-11-21 ENCOUNTER — Encounter: Payer: Self-pay | Admitting: Obstetrics

## 2018-11-21 LAB — CYTOLOGY - PAP
Diagnosis: NEGATIVE
HPV: NOT DETECTED

## 2018-12-04 ENCOUNTER — Encounter

## 2018-12-23 DIAGNOSIS — R634 Abnormal weight loss: Secondary | ICD-10-CM | POA: Diagnosis not present

## 2018-12-23 DIAGNOSIS — E119 Type 2 diabetes mellitus without complications: Secondary | ICD-10-CM | POA: Diagnosis not present

## 2018-12-23 DIAGNOSIS — I10 Essential (primary) hypertension: Secondary | ICD-10-CM | POA: Diagnosis not present

## 2018-12-23 DIAGNOSIS — E7800 Pure hypercholesterolemia, unspecified: Secondary | ICD-10-CM | POA: Diagnosis not present

## 2019-02-13 ENCOUNTER — Other Ambulatory Visit: Payer: Self-pay

## 2019-02-13 DIAGNOSIS — Z20822 Contact with and (suspected) exposure to covid-19: Secondary | ICD-10-CM

## 2019-02-15 LAB — NOVEL CORONAVIRUS, NAA: SARS-CoV-2, NAA: NOT DETECTED

## 2019-04-27 DIAGNOSIS — R634 Abnormal weight loss: Secondary | ICD-10-CM | POA: Diagnosis not present

## 2019-04-27 DIAGNOSIS — E7800 Pure hypercholesterolemia, unspecified: Secondary | ICD-10-CM | POA: Diagnosis not present

## 2019-04-27 DIAGNOSIS — E119 Type 2 diabetes mellitus without complications: Secondary | ICD-10-CM | POA: Diagnosis not present

## 2019-04-29 DIAGNOSIS — Z0000 Encounter for general adult medical examination without abnormal findings: Secondary | ICD-10-CM | POA: Diagnosis not present

## 2019-04-29 DIAGNOSIS — Z720 Tobacco use: Secondary | ICD-10-CM | POA: Diagnosis not present

## 2019-04-29 DIAGNOSIS — I270 Primary pulmonary hypertension: Secondary | ICD-10-CM | POA: Diagnosis not present

## 2019-04-29 DIAGNOSIS — K219 Gastro-esophageal reflux disease without esophagitis: Secondary | ICD-10-CM | POA: Diagnosis not present

## 2019-04-29 DIAGNOSIS — Z79899 Other long term (current) drug therapy: Secondary | ICD-10-CM | POA: Diagnosis not present

## 2019-04-29 DIAGNOSIS — I10 Essential (primary) hypertension: Secondary | ICD-10-CM | POA: Diagnosis not present

## 2019-04-29 DIAGNOSIS — E1169 Type 2 diabetes mellitus with other specified complication: Secondary | ICD-10-CM | POA: Diagnosis not present

## 2019-04-29 DIAGNOSIS — G720 Drug-induced myopathy: Secondary | ICD-10-CM | POA: Diagnosis not present

## 2019-04-29 DIAGNOSIS — E559 Vitamin D deficiency, unspecified: Secondary | ICD-10-CM | POA: Diagnosis not present

## 2019-04-29 DIAGNOSIS — I2510 Atherosclerotic heart disease of native coronary artery without angina pectoris: Secondary | ICD-10-CM | POA: Diagnosis not present

## 2019-04-29 DIAGNOSIS — E7800 Pure hypercholesterolemia, unspecified: Secondary | ICD-10-CM | POA: Diagnosis not present

## 2019-05-04 DIAGNOSIS — I10 Essential (primary) hypertension: Secondary | ICD-10-CM | POA: Diagnosis not present

## 2019-05-04 DIAGNOSIS — E119 Type 2 diabetes mellitus without complications: Secondary | ICD-10-CM | POA: Diagnosis not present

## 2019-05-04 DIAGNOSIS — E7800 Pure hypercholesterolemia, unspecified: Secondary | ICD-10-CM | POA: Diagnosis not present

## 2019-06-22 DIAGNOSIS — J441 Chronic obstructive pulmonary disease with (acute) exacerbation: Secondary | ICD-10-CM | POA: Diagnosis not present

## 2019-07-01 ENCOUNTER — Telehealth (INDEPENDENT_AMBULATORY_CARE_PROVIDER_SITE_OTHER): Payer: PPO | Admitting: Primary Care

## 2019-07-01 ENCOUNTER — Encounter: Payer: Self-pay | Admitting: Primary Care

## 2019-07-01 ENCOUNTER — Ambulatory Visit (INDEPENDENT_AMBULATORY_CARE_PROVIDER_SITE_OTHER): Payer: PPO

## 2019-07-01 DIAGNOSIS — J441 Chronic obstructive pulmonary disease with (acute) exacerbation: Secondary | ICD-10-CM | POA: Diagnosis not present

## 2019-07-01 DIAGNOSIS — R05 Cough: Secondary | ICD-10-CM | POA: Diagnosis not present

## 2019-07-01 MED ORDER — MONTELUKAST SODIUM 10 MG PO TABS: 10.0000 mg | ORAL_TABLET | Freq: Every day | ORAL | 3 refills | Status: DC

## 2019-07-01 MED ORDER — PREDNISONE 10 MG PO TABS: ORAL_TABLET | ORAL | 0 refills | Status: DC

## 2019-07-01 MED ORDER — STIOLTO RESPIMAT 2.5-2.5 MCG/ACT IN AERS: 2.0000 | INHALATION_SPRAY | Freq: Every day | RESPIRATORY_TRACT | 0 refills | Status: DC

## 2019-07-01 NOTE — Progress Notes (Signed)
CXR showed some thickening of central airway consistent with bronchitis. No pnuemonia. Typically we treat bronchitis with mucinex and bronchodilators. If she feels she needs abx we can try

## 2019-07-01 NOTE — Progress Notes (Signed)
Virtual Visit via Video Note  I connected with Stephanie Fox on 07/01/19 at  9:30 AM EST by a video enabled telemedicine application and verified that I am speaking with the correct person using two identifiers.  Location: Patient: Home Provider: Office   I discussed the limitations of evaluation and management by telemedicine and the availability of in person appointments. The patient expressed understanding and agreed to proceed.  History of Present Illness: 69 year old female, current every day smoker. PMH significant for HTN, hepatic cyst, type 2 diabetes, COPD/emphysema, solitary lung nodule. Patient of Dr. Vaughan Browner. PFTs showed no overt obstruction but there is a curvature to flow loop and reduction in mid-flow suggestive of small airway disease. Not on any maintenance inhalers.   CT chest 07/2017 showed 63mm RUL nodule and mild emphysematous changes. CT chest 10/07/18- 4 mm RIGHT upper lobe pulmonary nodule unchanged from comparison exam and considered benign. No new pulmonary nodules  Previous [facility] pulmonary encounter: 10/16/2018 Patient presents today for annual follow-up. She is feeling well, no acute complaints. Denies shortness of breath. Gets fatigued with activity. She is still smoking 10 cigarettes a day, trying to reduce her use with nicotine patches and is on wellbutrin. States that she is smoking more since retirement. CT chest 10/07/18 showed stable 55mm right upper lobe pulmonary nodule considered benign. Incidental findings coronary artery calcification and aortic atherosclerosis. Needs to follow up with lung cancer screening clinic to start annual low-dose screening.   07/01/2019 Patient contacted today for video visit. Reports shortness of breath and cough x 10 days. She originally saw her PCP and was prescribed fluticasone nasal spray, combivent hfa, codeine-guaifenesin cough syrup and zpack. She completed antibiotic on March 5th. Her shortness of breath is some better but she still  have a congested cough with thick clear mucus. Associated wheezing. Cough keeps her up at night. She has been taking mucinex which has helped thin her mucus but has not decreased production. She is hesitant to take prednisone d/t previous weight gain with medication. Denies sick contact, fever, chills, N/V/D. Due for covid vaccine   Observations/Objective:  Appears well, able to speak in full sentences   Data Reviewed: CT chest 10/07/18- 4 mm RIGHT upper lobe pulmonary nodule unchanged from comparison exam. No new pulmonary nodules. Coronary artery calcification and Aortic Atherosclerosis  CT chest 08/16/2017- mild centrilobular and paraseptal emphysema.  Mild scarring in the right middle lobe, lingula, lower lobes.  4 mm right upper lobe nodule.  PFTs 10/29/2017 FVC 2.17 [79%], FEV1 1.63 [76%], F/F 75, TLC 84%, DLCO 50% Minimal obstruction, moderate-severe diffusion defect  Assessment and Plan:  COPD/emphysema: - Increased shortness of breath, wheezing and cough x 10 days - No improvement after completing zpack  - Not on any maintenance inhalers. Trial Stiolto 2 puffs once daily  - Continue Combivent hfa 1 puff every 6 hours for breakthrough shortness of breath - Continue mucinex twice daily - Add Singulair 10mg  at bedtime and use flonase at bedtime  - RX prednisone 10mg  x 5 days if symptoms do not improve - Orders: CXR   Tobacco use:  - Quit smoking March 1st, congratulated on her hard work (continue to ensure smoking cessation) - Referred to lung cancer screening program at last visit (due June 2021)   Follow Up Instructions:   - If symptoms do not improve in 7-10 days   I discussed the assessment and treatment plan with the patient. The patient was provided an opportunity to ask questions and all  were answered. The patient agreed with the plan and demonstrated an understanding of the instructions.   The patient was advised to call back or seek an in-person evaluation if the  symptoms worsen or if the condition fails to improve as anticipated.  I provided 22 minutes of non-face-to-face time during this encounter.   Martyn Ehrich, NP

## 2019-07-01 NOTE — Patient Instructions (Addendum)
Recommendation - Trial Stiolto two puffs in morning every day - Back up is combivent 1 puffs every 6 hours  - Take Flonase at night - Take Singulair at night  - Continue mucinex twice daily  - Take low dose prednisone for 5 days if symptoms do not improve   Orders: - CXR today  Re: cough   Rx: - Singulair 10mg  at bedtime  - Prednisone 10mg  x 5 days  - Sample Stiolto inhaler (pick up at office)

## 2019-07-06 ENCOUNTER — Telehealth: Payer: Self-pay | Admitting: Primary Care

## 2019-07-06 NOTE — Telephone Encounter (Signed)
atc pt, no answer, no vm.  Wcb.  

## 2019-07-07 MED ORDER — DOXYCYCLINE HYCLATE 100 MG PO TABS: 100.0000 mg | ORAL_TABLET | Freq: Two times a day (BID) | ORAL | 0 refills | Status: DC

## 2019-07-07 NOTE — Telephone Encounter (Signed)
ATC pt, there was no answer and I could not leave a message. Will try back. 

## 2019-07-07 NOTE — Telephone Encounter (Signed)
I'm glad she is doing some better. Continue Stiolto, Singulair and mucin. Will send in course of doxycycline.

## 2019-07-07 NOTE — Telephone Encounter (Signed)
Called and spoke with patient.  [name],NP recommendations given.  Understanding stated. Nothing further at this time.

## 2019-07-07 NOTE — Telephone Encounter (Signed)
Patient is returning phone call. Patient phone number is [phone].

## 2019-07-07 NOTE — Telephone Encounter (Signed)
Patient is returning phone call. Patient phone number is [phone]. May leave detailed message on voicemail.

## 2019-07-07 NOTE — Telephone Encounter (Signed)
Called and spoke with Patient.  Patient stated she is feeling better, but is still having chest congestion.  Patient stated she coughed this morning, and had clear, glob, of mucus. Patient stated she has finished prednisone, continued Stiolto, Singulair, nasal spray, and mucinex.  Patient stated [name], NP,had mentioned possible antibiotic. Patient requested call back on her cell # [phone].  Message routed to [place], NP

## 2019-07-07 NOTE — Telephone Encounter (Signed)
lmtcb for pt.  

## 2019-07-12 IMAGING — CT CT CHEST LUNG CANCER SCREENING LOW DOSE W/O CM
1 of 4 series · 10 of 40 positions shown, 13 images · non-contrast
Comparison: None.

ADDENDUM:
Patient has had routine annual mammography. She has a stable benign
mass lateral left breast which correlates with the small mass seen
on the current lung cancer screening CT scan. No further evaluation
is indicated.
CLINICAL DATA: Current smoker, 45 pack-year history, lung cancer
screening.

EXAM:
CT CHEST WITHOUT CONTRAST LOW-DOSE FOR LUNG CANCER SCREENING
TECHNIQUE: Multidetector CT imaging of the chest was performed following the
standard protocol without IV contrast.

[ct lung segmentation data · axial · 0.78mm/px · z∈[-350,-350]mm · 10 of 324 frames shown]
[frame 1/324  mediastinal]
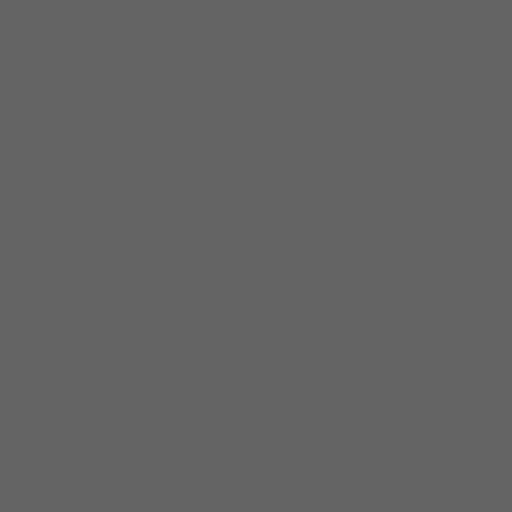
[frame 1/324  lung]
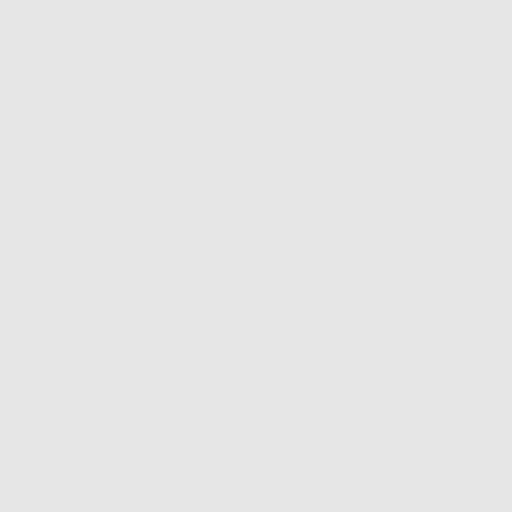
[frame 36/324  lung]
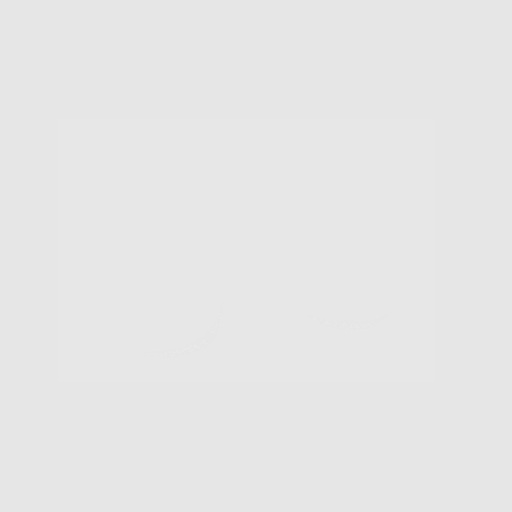
[frame 72/324  lung]
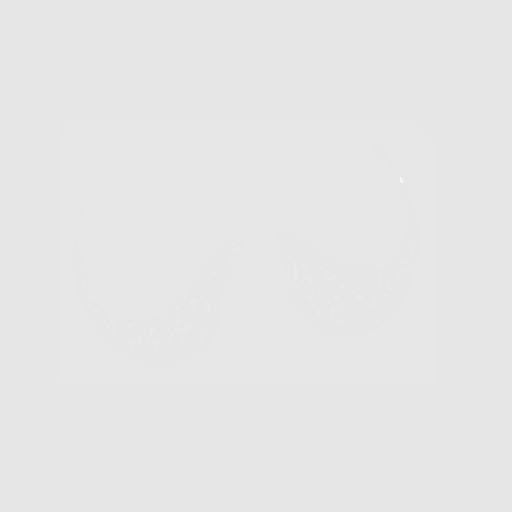
[frame 108/324  lung]
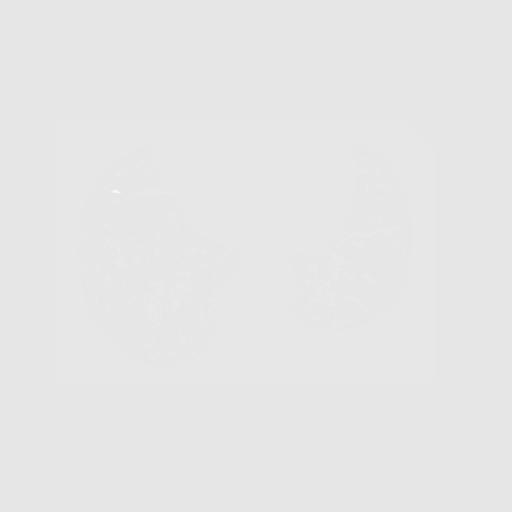
[frame 144/324  mediastinal]
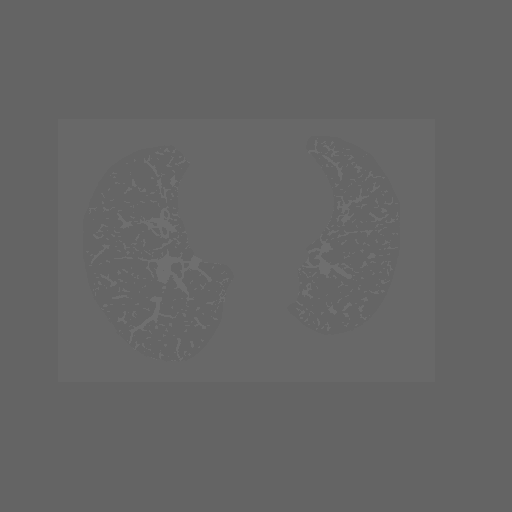
[frame 144/324  lung]
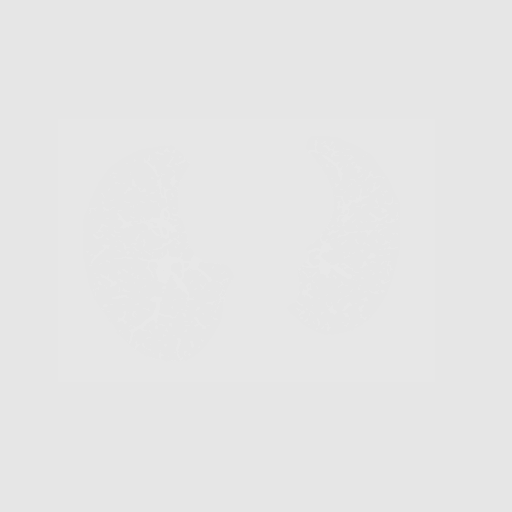
[frame 180/324  lung]
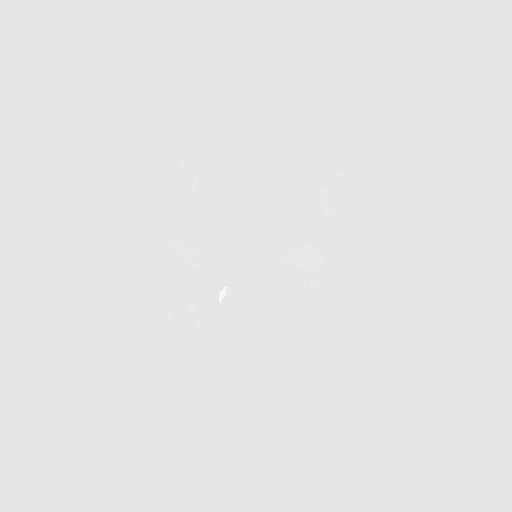
[frame 216/324  lung]
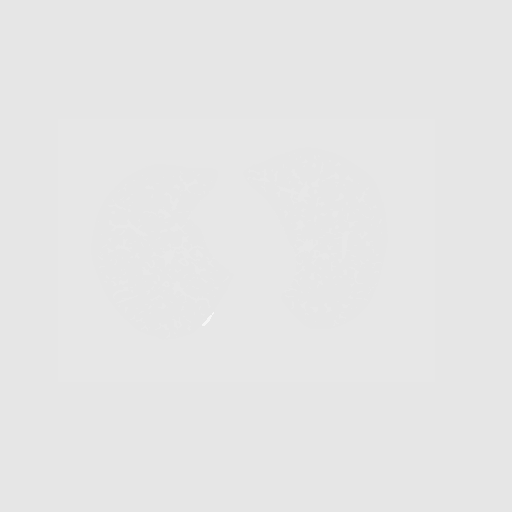
[frame 252/324  lung]
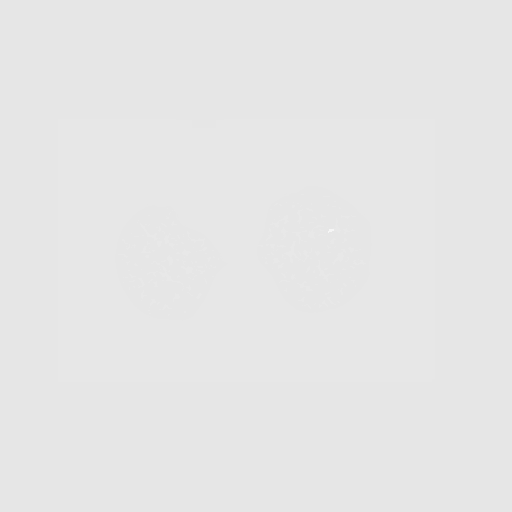
[frame 288/324  mediastinal]
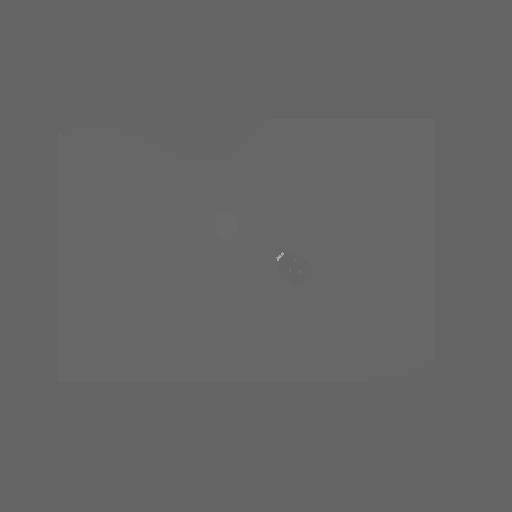
[frame 288/324  lung]
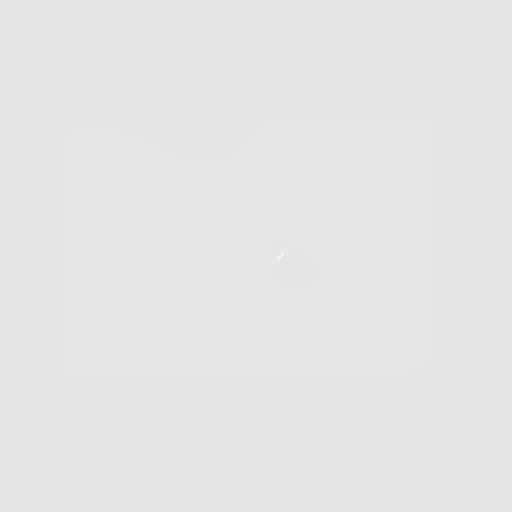
[frame 324/324  lung]
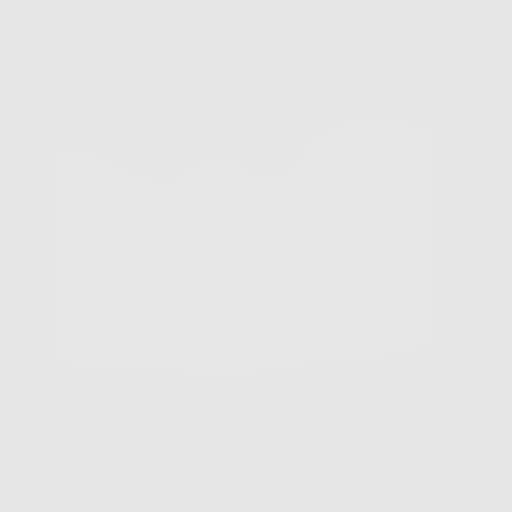

[10 of 40 positions shown; findings below may reference images not displayed]

FINDINGS: Cardiovascular: Atherosclerotic calcification of the arterial
vasculature, including coronary arteries. Pulmonary arteries are
enlarged. Heart size within normal limits. Small amount of
pericardial fluid may be physiologic.

Mediastinum/Nodes: No pathologically enlarged mediastinal or
axillary lymph nodes. Hilar regions are difficult to evaluate
without IV contrast but appear grossly unremarkable. Esophagus is
grossly unremarkable.

Lungs/Pleura: Centrilobular and paraseptal emphysema. Mild scarring
in the right middle lobe, lingula and both lower lobes. 4 mm right
upper lobe nodule. Lungs are otherwise clear. No pleural fluid.
Airway is unremarkable.

Upper Abdomen: Low-attenuation lesions in the liver measure up to
7.2 cm in the left hepatic lobe, incompletely imaged. Visualized
portions of the gallbladder, adrenal glands, left kidney, spleen,
pancreas, stomach and bowel are grossly unremarkable.

Musculoskeletal: Degenerative changes in the spine. No worrisome
lytic or sclerotic lesions. 1.7 cm nodule in the left breast.
IMPRESSION: 1. Lung-RADS 2, benign appearance or behavior. Continue annual
screening with low-dose chest CT without contrast in 12 months.
2. Aortic atherosclerosis (INYEV-170.0). Coronary artery
calcification.
3. Enlarged pulmonary arteries, indicative of pulmonary arterial
hypertension.
4.  Emphysema (INYEV-9JV.D).
5. Left breast nodule. Please correlate with recent mammography and
physical exam.

## 2019-07-20 ENCOUNTER — Telehealth: Payer: Self-pay | Admitting: Primary Care

## 2019-07-20 ENCOUNTER — Encounter: Payer: Self-pay | Admitting: Acute Care

## 2019-07-20 ENCOUNTER — Telehealth (INDEPENDENT_AMBULATORY_CARE_PROVIDER_SITE_OTHER): Payer: PPO | Admitting: Acute Care

## 2019-07-20 DIAGNOSIS — J301 Allergic rhinitis due to pollen: Secondary | ICD-10-CM

## 2019-07-20 DIAGNOSIS — Z87891 Personal history of nicotine dependence: Secondary | ICD-10-CM

## 2019-07-20 DIAGNOSIS — J441 Chronic obstructive pulmonary disease with (acute) exacerbation: Secondary | ICD-10-CM

## 2019-07-20 DIAGNOSIS — R05 Cough: Secondary | ICD-10-CM

## 2019-07-20 DIAGNOSIS — R059 Cough, unspecified: Secondary | ICD-10-CM

## 2019-07-20 NOTE — Patient Instructions (Addendum)
It is good to talk with you today. Congratulations on quitting smoking. I am doing a happy dance Remember to continue your Stialto every day without fail. Take 2 puffs daily Rinse mouth after use. This is your maintenance medication We will have a Stialto sample ready for you tomorrow at the office. Use the Combivent for breakthrough cough or shortness of breath/ wheezing. Continue Singulair every day. Sips of water instead of throat clearing Sugar Free [employer] or [employer] originals for throat soothing. Delsym Cough syrup 5 cc's every 12 hours Non-sedating antihistamine of your choice daily ( Zyrtec, Allegra, Xyzol, Claritin ( Generic ok)>> take this in the morning and your Singulair in the evening. Use your Flonase 2 sprays each nare daily in the morning. Make sure you blow your nose before using the Flonase so that it makes contact with your mucus membranes.  Follow up in 2 weeks with Derl Barrow 08/03/2019 at 3 pm Please contact office for sooner follow up if symptoms do not improve or worsen or seek emergency care . Call us sooner if you need [place]. Remember to get your Covid vaccine if you have not already done so.

## 2019-07-20 NOTE — Telephone Encounter (Signed)
Called and spoke to pt. Pt states she has completed her pred but is still coughing. Mychart appt made with Eric Form, NP, this afternoon at4pm. Pt verbalized understanding and denied any further questions or concerns at this time.

## 2019-07-20 NOTE — Progress Notes (Signed)
Virtual Visit via Video Note  I connected with Stephanie Fox on 07/20/19 at  4:00 PM EDT by a video enabled telemedicine application and verified that I am speaking with the correct person using two identifiers.  Location: Patient: At home Provider:  [address], [place], [place], [address]   I discussed the limitations of evaluation and management by telemedicine and the availability of in person appointments. The patient expressed understanding and agreed to proceed.  Synopsis: 69 year old female, former smoker ( Quit 06/2019) PMH significant for HTN, hepatic cyst, type 2 diabetes, COPD/emphysema, solitary lung nodule. Patient of Dr. Vaughan Browner. PFTs showed no overt obstruction but there is a curvature to flow loop and reduction in mid-flow suggestive of small airway disease. Not on any maintenance inhalers.   CT chest 07/2017 showed 67mm RUL nodule and mild emphysematous changes. CT chest 10/07/18-4 mm RIGHT upper lobe pulmonary nodule unchanged from comparison examand considered benign.No new pulmonary nodules  History of Present Illness: Pt. Presents for an acute OV. She was seen by Derl Barrow 07/01/2019 . She Quit smoking 06/22/2019!!! We celebrated!!!  She was treated for a COPD exacerbation with a prednisone 10 mg x 5 days, and a z pack and Doxycycline. She completed all treatment. . She calls today stating she still has a cough. It is better than it was but it is not gone. She stopped her [place] last Thursday, as well as her Singulair and her Mucinex. She thought she was  supposed to stop these medications when her antibiotic stopped. We have reviewed that the Mart Piggs is a maintenance medication. She is going to continue this daily until she has follow up in 2 weeks with Derl Barrow NP. I have looked at her formulary, and this is not a preferred inhaler. I will have [name] prescribe her maintenance based on insurance preference. They seem to prefer Anoro and Breo, both or which are powder  based, and may not be a good option with her cough. Pt. States her secretions are clear. They are thicker in the morning, but she is complaining of very thin secretions form her nose. She endorses PND, and frequent throat clearing. She denies any fever or chest pain, no orthopnea or hemoptysis.   Observations/Objective: CXR 07/01/2019 Central peribronchial thickening consistent with a degree of bronchitis. Slight scarring right mid lung. No edema or airspace opacity. Cardiac silhouette normal. No adenopathy.  CT chest 10/07/18- 4 mm RIGHT upper lobe pulmonary nodule unchanged from comparison exam. No new pulmonary nodules. Coronary artery calcification and Aortic Atherosclerosis  CT chest 08/16/2017-mild centrilobular and paraseptal emphysema. Mild scarring in the right middle lobe, lingula, lower lobes. 4 mm right upper lobe nodule.  PFTs 10/29/2017 FVC 2.17 [79%], FEV1 1.63 [76%], F/F 75, TLC 84%, DLCO 50% Minimal obstruction, moderate-severe diffusion defect  Assessment and Plan: Slow to Resolve COPD exacerbation Better after pred taper/ z pack and Doxy, but cough is lingering Plan Congratulations on quitting smoking. I am doing a happy dance Remember to continue your Stialto every day without fail. Take 2 puffs daily Rinse mouth after use. This is your maintenance medication We will have a Stialto sample ready for you tomorrow at the office. Use the Combivent for breakthrough cough or shortness of breath/ wheezing. We will prescribe a maintenance inhaler that is preferred by your insurance at your follow up in 2 weeks ( Breo and Anoro are preferred, but are powder and may irritate her cough) Continue Singulair 10 mg every day. Sips of  water instead of throat clearing Sugar Free [employer] or [employer]ther[employer] originals for throat soothing. Delsym Cough syrup 5 cc's every 12 hours Non-sedating antihistamine of your choice daily ( Zyrtec, Allegra, Xyzol, Claritin ( Generic ok)>> take  this in the morning and your Singulair in the evening. Use your Flonase 2 sprays each nare daily in the morning. Make sure you blow your nose before using the Flonase so that it makes contact with your mucus membranes.  Follow up in 2 weeks with Derl Barrow 08/03/2019 at 3 pm Please contact office for sooner follow up if symptoms do not improve or worsen or seek emergency care . Call us sooner if you need [place]. Remember to get your Covid vaccine if you have not already done so.   Pulmonary Nodule Will be followed by the screening program starting 09/2019. Scan is due 09/2019      Follow Up Instructions: Follow up video visit with Derl Barrow NP, 08/03/2019 at 3 pm  Call sooner if you need [place] sooner.   I discussed the assessment and treatment plan with the patient. The patient was provided an opportunity to ask questions and all were answered. The patient agreed with the plan and demonstrated an understanding of the instructions.   The patient was advised to call back or seek an in-person evaluation if the symptoms worsen or if the condition fails to improve as anticipated.  I provided 35 minutes of non-face-to-face time during this encounter.   Magdalen Spatz, NP 07/20/2019 5:11 PM

## 2019-07-21 MED ORDER — STIOLTO RESPIMAT 2.5-2.5 MCG/ACT IN AERS: 2.0000 | INHALATION_SPRAY | Freq: Every day | RESPIRATORY_TRACT | 0 refills | Status: DC

## 2019-07-21 NOTE — Addendum Note (Signed)
Addended by: Jannette Spanner on: 07/21/2019 08:43 AM   Modules accepted: Orders

## 2019-07-28 ENCOUNTER — Telehealth (INDEPENDENT_AMBULATORY_CARE_PROVIDER_SITE_OTHER): Payer: PPO | Admitting: Primary Care

## 2019-07-28 ENCOUNTER — Encounter: Payer: Self-pay | Admitting: Primary Care

## 2019-07-28 DIAGNOSIS — R918 Other nonspecific abnormal finding of lung field: Secondary | ICD-10-CM | POA: Diagnosis not present

## 2019-07-28 DIAGNOSIS — J439 Emphysema, unspecified: Secondary | ICD-10-CM

## 2019-07-28 DIAGNOSIS — F17201 Nicotine dependence, unspecified, in remission: Secondary | ICD-10-CM

## 2019-07-28 DIAGNOSIS — F17200 Nicotine dependence, unspecified, uncomplicated: Secondary | ICD-10-CM

## 2019-07-28 NOTE — Progress Notes (Signed)
Virtual Visit via Video Note  I connected with Stephanie Fox on 07/28/19 at  3:00 PM EDT by a video enabled telemedicine application and verified that I am speaking with the correct person using two identifiers.  Location: Patient: Home Provider: Home   I discussed the limitations of evaluation and management by telemedicine and the availability of in person appointments. The patient expressed understanding and agreed to proceed.  History of Present Illness: 69 year old female, former smoker. PMH significant for HTN, hepatic cyst, type 2 diabetes, COPD/emphysema, solitary lung nodule. Patient of Dr. Vaughan Browner. PFTs showed no overt obstruction but there is a curvature to flow loop and reduction in mid-flow suggestive of small airway disease. Not on any maintenance inhalers.   CT chest 07/2017 showed 61mm RUL nodule and mild emphysematous changes. CT chest 10/07/18- 4 mm RIGHT upper lobe pulmonary nodule unchanged from comparison exam and considered benign. No new pulmonary nodules  Previous [facility] pulmonary encounter: 10/16/2018 Patient presents today for annual follow-up. She is feeling well, no acute complaints. Denies shortness of breath. Gets fatigued with activity. She is still smoking 10 cigarettes a day, trying to reduce her use with nicotine patches and is on wellbutrin. States that she is smoking more since retirement. CT chest 10/07/18 showed stable 56mm right upper lobe pulmonary nodule considered benign. Incidental findings coronary artery calcification and aortic atherosclerosis. Needs to follow up with lung cancer screening clinic to start annual low-dose screening.   07/01/2019 Patient contacted today for video visit. Reports shortness of breath and cough x 10 days. She originally saw her PCP and was prescribed fluticasone nasal spray, combivent hfa, codeine-guaifenesin cough syrup and zpack. She completed antibiotic on March 5th. Her shortness of breath is some better but she still have a  congested cough with thick clear mucus. Associated wheezing. Cough keeps her up at night. She has been taking mucinex which has helped thin her mucus but has not decreased production. She is hesitant to take prednisone d/t previous weight gain with medication. Denies sick contact, fever, chills, N/V/D. Due for covid vaccine   07/28/2019 Patient contacted today for follow-up video call. She feels back to her baseline. Her cough and breathing have improved. She complete sample of Stiolto, she has not needed to use combivent rescue inhaler. She does not have daily respiratory symptoms. She has been taking Singulair daily.    Observations/Objective:  - Appears well; no shortness of breath, wheezing or cough  Assessment and Plan:  COPD/emphysema - Previously well controlled. She was treated for extended COPD exacerbation in March with zpack, prednisone and given sample of Stiolto. She has returned to baseline. Denies cough or shortness of breath.  - Ok to stop Stiolto and continue prn combivent 1 puff q 6 hours for sob/wheezing  Former smoker - Quit smoking June 22 2019 - Refer to lung cancer screening program  Pulmonary nodules: - CT chest 10/07/18 showed stable 57mm right upper lobe pulmonary nodule considered benign  Follow Up Instructions:  -3-4 months fu with Dr. Vaughan Browner   I discussed the assessment and treatment plan with the patient. The patient was provided an opportunity to ask questions and all were answered. The patient agreed with the plan and demonstrated an understanding of the instructions.   The patient was advised to call back or seek an in-person evaluation if the symptoms worsen or if the condition fails to improve as anticipated.  I provided 18 minutes of non-face-to-face time during this encounter.   Martyn Ehrich,  NP

## 2019-07-28 NOTE — Patient Instructions (Addendum)
Recommendations: Stop stiolto  Use combivent rescue inhaler 1 puff every 6 hours as needed for breakthrough shortness of breath or wheezing   Referral: Lung cancer screening clinic  Follow-up: 3-4 month with Dr. Vaughan Browner

## 2019-08-02 ENCOUNTER — Encounter: Payer: Self-pay | Admitting: Primary Care

## 2019-08-02 ENCOUNTER — Telehealth: Payer: Self-pay | Admitting: Primary Care

## 2019-08-02 MED ORDER — IPRATROPIUM-ALBUTEROL 20-100 MCG/ACT IN AERS: 1.0000 | INHALATION_SPRAY | Freq: Four times a day (QID) | RESPIRATORY_TRACT | 1 refills | Status: DC

## 2019-08-02 NOTE — Telephone Encounter (Signed)
Refer to lung cancer screening if not already enrolled

## 2019-08-03 ENCOUNTER — Ambulatory Visit: Payer: PPO | Admitting: Primary Care

## 2019-08-04 NOTE — Telephone Encounter (Signed)
Referral already in.

## 2019-08-27 ENCOUNTER — Other Ambulatory Visit: Payer: Self-pay

## 2019-08-27 ENCOUNTER — Encounter: Payer: Self-pay | Admitting: Obstetrics

## 2019-08-27 ENCOUNTER — Ambulatory Visit: Payer: PPO | Admitting: Obstetrics

## 2019-08-27 ENCOUNTER — Other Ambulatory Visit (HOSPITAL_COMMUNITY)
Admission: RE | Admit: 2019-08-27 | Discharge: 2019-08-27 | Disposition: A | Discharge status: Home / Self Care | Payer: PPO | Source: Ambulatory Visit | Attending: Obstetrics | Admitting: Obstetrics

## 2019-08-27 VITALS — BP 125/82 | HR 96 | Wt 263.0 lb

## 2019-08-27 DIAGNOSIS — N950 Postmenopausal bleeding: Secondary | ICD-10-CM | POA: Diagnosis not present

## 2019-08-27 DIAGNOSIS — R3129 Other microscopic hematuria: Secondary | ICD-10-CM | POA: Diagnosis not present

## 2019-08-27 DIAGNOSIS — Z6841 Body Mass Index (BMI) 40.0 and over, adult: Secondary | ICD-10-CM | POA: Diagnosis not present

## 2019-08-27 DIAGNOSIS — N898 Other specified noninflammatory disorders of vagina: Secondary | ICD-10-CM

## 2019-08-27 DIAGNOSIS — E66813 Obesity, class 3: Secondary | ICD-10-CM

## 2019-08-27 LAB — POCT URINALYSIS DIPSTICK
Bilirubin, UA: NEGATIVE
Glucose, UA: NEGATIVE
Ketones, UA: NEGATIVE
Leukocytes, UA: NEGATIVE
Nitrite, UA: NEGATIVE
Protein, UA: NEGATIVE
Spec Grav, UA: >=1.03 — AB (ref 1.010–1.025)
Urobilinogen, UA: 0.2 E.U./dL
pH, UA: 6 (ref 5.0–8.0)

## 2019-08-27 NOTE — Progress Notes (Signed)
Patient ID: Stephanie Fox, female   DOB: 1951/02/17, 69 y.o.   MRN: [id number]  Chief Complaint  Patient presents with  . Gynecologic Exam    HPI Stephanie Fox is a 69 y.o. female.  Noticed dark blood on tissue, scant.  No active BRB. HPI  Past Medical History:  Diagnosis Date  . Anal or rectal pain    per pt / had 3 times  in last 3 years  . Anemia    as a child   . Arthritis   . DDD (degenerative disc disease), lumbosacral    l4-5, S1  . Diabetes mellitus ([place])    type II   . Dyspnea    with exertion   . Edema    lower extremities   . GERD (gastroesophageal reflux disease)    in past  . History of kidney stones   . Hyperlipidemia   . Hypertension   . Low back pain   . Spinal stenosis     Past Surgical History:  Procedure Laterality Date  . BREAST REDUCTION SURGERY  12/2002   [place]  . KNEE SURGERY    . LUMBAR EPIDURAL INJECTION     In Dec 2018,had 4 injection  . REDUCTION MAMMAPLASTY Bilateral 15 years ago  . thumb surgery  1990   right thumb  . 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 underarm surgery  1980's   gland under right arm got infected due to shaving    Family History  Problem Relation Age of Onset  . Colon cancer Neg Hx     Social History Social History   Tobacco Use  . Smoking status: Former Smoker    Packs/day: 0.50    Types: Cigarettes    Quit date: 06/22/2019    Years since quitting: 0.1  . Smokeless tobacco: Never Used  Substance Use Topics  . Alcohol use: No  . Drug use: No    No Known Allergies  Current Outpatient Medications  Medication Sig Dispense Refill  . ASPIRIN 81 PO aspirin 81 mg tablet,delayed release  Take 1 tablet every day by oral route.    . benzonatate (TESSALON) 100 MG capsule Take 100 mg by mouth 2 (two) times daily as needed for cough.     Marland Kitchen buPROPion  (WELLBUTRIN XL) 150 MG 24 hr tablet Take 150 mg by mouth daily.    . Cholecalciferol (VITAMIN D3) 2000 UNITS TABS Take 2,000 Units by mouth daily.     . Dulaglutide (TRULICITY) 1.5 GG/8.3MO SOPN Inject 1.5 mg into the skin.    Marland Kitchen ezetimibe (ZETIA) 10 MG tablet Take 10 mg by mouth daily.  6  . fluticasone (FLONASE) 50 MCG/ACT nasal spray Place into both nostrils daily.    . furosemide (LASIX) 40 MG tablet Take 40 mg by mouth daily as needed for fluid.     Marland Kitchen glimepiride (AMARYL) 1 MG tablet Take 1 mg by mouth as needed.    . Ipratropium-Albuterol (COMBIVENT) 20-100 MCG/ACT AERS respimat Inhale 1 puff into the lungs every 6 (six) hours. 4 g 1  . Liraglutide (VICTOZA) 18 MG/3ML SOPN Inject 0.6 mg into the skin daily.     . metFORMIN (GLUCOPHAGE) 500 MG tablet Take 500 mg by mouth 2 (two) times daily with a meal.    . montelukast (SINGULAIR) 10 MG tablet Take 1 tablet (10 mg  total) by mouth at bedtime. 30 tablet 3  . Multiple Vitamin (MULTIVITAMIN) tablet Take 1 tablet by mouth daily. Reported on 07/21/2015    . valsartan-hydrochlorothiazide (DIOVAN-HCT) 80-12.5 MG per tablet Take 1 tablet by mouth daily.    . vitamin E 400 UNIT capsule Take 400 Units by mouth 2 (two) times daily.     . Dulaglutide (TRULICITY [place]) Inject into the skin once a week.    Marland Kitchen HYDROcodone-acetaminophen (NORCO/VICODIN) 5-325 MG tablet Take 1-2 tablets by mouth every 4 (four) hours as needed for moderate pain or severe pain. (Patient not taking: Reported on 08/27/2019) 30 tablet 0  . Nicotine 21-14-7 MG/24HR KIT USE AS DIRECTED (Patient not taking: Reported on 08/27/2019) 56 each 0   No current facility-administered medications for this visit.    Review of Systems Review of Systems Constitutional: negative for fatigue and weight loss Respiratory: negative for cough and wheezing Cardiovascular: negative for chest pain, fatigue and palpitations Gastrointestinal: negative for abdominal pain and change in bowel  habits Genitourinary:positive for vaginal spotting Integument/breast: negative for nipple discharge Musculoskeletal:negative for myalgias Neurological: negative for gait problems and tremors Behavioral/Psych: negative for abusive relationship, depression Endocrine: negative for temperature intolerance      Blood pressure 125/82, pulse 96, weight 263 lb (119.3 kg).  Physical Exam Physical Exam           General: Alert and no distress Abdomen:  normal findings: no organomegaly, soft, non-tender and no hernia  Pelvis:  External genitalia: normal general appearance Urinary system: urethral meatus normal and bladder without fullness, nontender Vaginal: normal without tenderness, induration or masses Cervix: normal appearance Adnexa: normal bimanual exam Uterus: anteverted and non-tender, normal size    50% of 20 min visit spent on counseling and coordination of care.   Data Reviewed U/A Wet Prep  Assessment    1. Postmenopausal bleeding Rx: - US PELVIC COMPLETE WITH TRANSVAGINAL; Future  2. Vaginal discharge Rx: - Cervicovaginal ancillary only  3. Hematuria, microscopic Rx: - Urine Culture - POCT urinalysis dipstick  4. Class 3 severe obesity due to excess calories without serious comorbidity with body mass index (BMI) of 40.0 to 44.9 in adult [facility]) - Phentermine 37.5 mg po daily in AM for short term start-up of a more long-term weight management program that includes caloric reduction, exercise and behavioral modification  Plan   Follow up in 2 weeks  Orders Placed This Encounter  Procedures  . Urine Culture  . US PELVIC COMPLETE WITH TRANSVAGINAL    HTA Epic ORDER NO TRAVEL/NO COVID WT: 263/NO NEEDS EW SW ANEETRA [id number] 32 OZ LIQUID 1 HR. PRIOR TO APPOINTMENT. FINISH IN 30 MINUTES.  ARRIVE ALONE/WEAR MASK/INS CARD/ID  ORDER CHECKED EW 08/27/19    Standing Status:   Future    Standing Expiration Date:   10/26/2020    Order Specific Question:   Reason for  Exam (SYMPTOM  OR DIAGNOSIS REQUIRED)    Answer:   Postmenopausal Bleeding    Order Specific Question:   Preferred imaging location?    Answer:   [facility]  . POCT urinalysis dipstick      Shelly Bombard, MD 08/27/2019 3:16 PM    Endometrial Biopsy Procedure Note  Pre-operative Diagnosis: Postmenopausal Bleeding  Post-operative Diagnosis: same  Indications: postmenopausal bleeding  Procedure Details   Urine pregnancy test was not done.  The risks (including infection, bleeding, pain, and uterine perforation) and benefits of the procedure were explained to the patient and Written informed consent was  obtained.    The patient was placed in the dorsal lithotomy position.  Bimanual exam showed the uterus to be in the neutral position.  A Graves' speculum inserted in the vagina, and the cervix prepped with povidone iodine.  Endocervical curettage with a Kevorkian curette was performed.   A sharp tenaculum was applied to the anterior lip of the cervix for stabilization.  A sterile uterine sound was used to sound the uterus to a depth of 5cm.  A Pipelle endometrial aspirator was used to sample the endometrium.  Sample was sent for pathologic examination.  Condition: Stable  Complications: None  Plan:  The patient was advised to call for any fever or for prolonged or severe pain or bleeding. She was advised to use NSAID as needed for mild to moderate pain. She was advised to avoid vaginal intercourse for 48 hours or until the bleeding has completely stopped.  Attending Physician Documentation: I was present for or participated in the entire procedure, including opening and closing.  Shelly Bombard, MD 08/27/2019 3:22 PM

## 2019-08-28 ENCOUNTER — Other Ambulatory Visit (HOSPITAL_COMMUNITY)
Admission: RE | Admit: 2019-08-28 | Discharge: 2019-08-28 | Disposition: A | Discharge status: Home / Self Care | Payer: PPO | Source: Ambulatory Visit | Attending: Obstetrics | Admitting: Obstetrics

## 2019-08-28 DIAGNOSIS — N950 Postmenopausal bleeding: Secondary | ICD-10-CM | POA: Insufficient documentation

## 2019-08-28 LAB — CERVICOVAGINAL ANCILLARY ONLY
Bacterial Vaginitis (gardnerella): NEGATIVE
Candida Glabrata: NEGATIVE
Candida Vaginitis: NEGATIVE
Comment: NEGATIVE
Comment: NEGATIVE
Comment: NEGATIVE

## 2019-08-28 NOTE — Addendum Note (Signed)
Addended by: Lewie Loron D on: 08/28/2019 11:33 AM   Modules accepted: Orders

## 2019-08-29 LAB — URINE CULTURE: Organism ID, Bacteria: NO GROWTH

## 2019-09-01 LAB — SURGICAL PATHOLOGY

## 2019-09-04 ENCOUNTER — Ambulatory Visit
Admission: RE | Admit: 2019-09-04 | Discharge: 2019-09-04 | Disposition: A | Discharge status: Home / Self Care | Payer: PPO | Source: Ambulatory Visit | Attending: Obstetrics | Admitting: Obstetrics

## 2019-09-04 DIAGNOSIS — D250 Submucous leiomyoma of uterus: Secondary | ICD-10-CM | POA: Diagnosis not present

## 2019-09-04 DIAGNOSIS — N950 Postmenopausal bleeding: Secondary | ICD-10-CM

## 2019-09-07 ENCOUNTER — Other Ambulatory Visit: Payer: Self-pay | Admitting: Obstetrics

## 2019-09-07 ENCOUNTER — Telehealth: Payer: Self-pay | Admitting: Obstetrics

## 2019-09-07 DIAGNOSIS — N950 Postmenopausal bleeding: Secondary | ICD-10-CM

## 2019-09-07 NOTE — Patient Instructions (Signed)
Hysteroscopy Hysteroscopy is a procedure that is used to examine the inside of a woman's womb (uterus). This may be done for various reasons, including:  To look for lumps (tumors) and other growths in the uterus.  To evaluate abnormal bleeding, fibroid tumors, polyps, scar tissue (adhesions), or cancer of the uterus.  To determine the cause of an inability to get pregnant (infertility) or repeated losses of pregnancies (miscarriages).  To find a lost IUD (intrauterine device).  To perform a procedure that permanently prevents pregnancy (sterilization). During this procedure, a thin, flexible tube with a small light and camera (hysteroscope) is used to examine the uterus. The camera sends images to a monitor in the room so that your health care provider can view the inside of your uterus. A hysteroscopy should be done right after a menstrual period to make sure that you are not pregnant. Tell a health care provider about:  Any allergies you have.  All medicines you are taking, including vitamins, herbs, eye drops, creams, and over-the-counter medicines.  Any problems you or family members have had with the use of anesthetic medicines.  Any blood disorders you have.  Any surgeries you have had.  Any medical conditions you have.  Whether you are pregnant or may be pregnant. What are the risks? Generally, this is a safe procedure. However, problems may occur, including:  Excessive bleeding.  Infection.  Damage to the uterus or other structures or organs.  Allergic reaction to medicines or fluids that are used in the procedure. What happens before the procedure? Staying hydrated Follow instructions from your health care provider about hydration, which may include:  Up to 2 hours before the procedure - you may continue to drink clear liquids, such as water, clear fruit juice, black coffee, and plain tea. Eating and drinking restrictions Follow instructions from your health care  provider about eating and drinking, which may include:  8 hours before the procedure - stop eating solid foods and drink clear liquids only  2 hours before the procedure - stop drinking clear liquids. General instructions  Ask your health care provider about: ? Changing or stopping your normal medicines. This is important if you take diabetes medicines or blood thinners. ? Taking medicines such as aspirin and ibuprofen. These medicines can thin your blood and cause bleeding. Do not take these medicines for 1 week before your procedure, or as told by your health care provider.  Do not use any products that contain nicotine or tobacco for 2 weeks before the procedure. This includes cigarettes and e-cigarettes. If you need help quitting, ask your health care provider.  Medicine may be placed in your cervix the day before the procedure. This medicine causes the cervix to have a larger opening (dilate). The larger opening makes it easier for the hysteroscope to be inserted into the uterus during the procedure.  Plan to have someone with you for the first 24-48 hours after the procedure, especially if you are given a medicine to make you fall asleep (general anesthetic).  Plan to have someone take you home from the hospital or clinic. What happens during the procedure?  To lower your risk of infection: ? Your health care team will wash or sanitize their hands. ? Your skin will be washed with soap. ? Hair may be removed from the surgical area.  An IV tube will be inserted into one of your veins.  You may be given one or more of the following: ? A medicine to help  you relax (sedative). ? A medicine that numbs the area around the cervix (local anesthetic). ? A medicine to make you fall asleep (general anesthetic).  A hysteroscope will be inserted through your vagina and into your uterus.  Air or fluid will be used to enlarge your uterus, enabling your health care provider to see your uterus  better. The amount of fluid used will be carefully checked throughout the procedure.  In some cases, tissue may be gently scraped from inside the uterus and sent to a lab for testing (biopsy). The procedure may vary among health care providers and hospitals. What happens after the procedure?  Your blood pressure, heart rate, breathing rate, and blood oxygen level will be monitored until the medicines you were given have worn off.  You may have some cramping. You may be given medicines for this.  You may have bleeding, which varies from light spotting to menstrual-like bleeding. This is normal.  If you had a biopsy done, it is your responsibility to get the results of your procedure. Ask your health care provider, or the department performing the procedure, when your results will be ready. Summary  Hysteroscopy is a procedure that is used to examine the inside of a woman's womb (uterus).  After the procedure, you may have bleeding, which varies from light spotting to menstrual-like bleeding. This is normal. You may also have cramping.  Plan to have someone take you home from the hospital or clinic. This information is not intended to replace advice given to you by your health care provider. Make sure you discuss any questions you have with your health care provider. Document Revised: 03/22/2017 Document Reviewed: 05/08/2016 Elsevier Patient Education  [place].  Postmenopausal Bleeding  Postmenopausal bleeding is any bleeding that a woman has after she has entered into menopause. Menopause is the end of a woman's fertile years. After menopause, a woman no longer ovulates and does not have menstrual periods. Postmenopausal bleeding may have various causes, including:  Menopausal hormone therapy (MHT).  Endometrial atrophy. After menopause, low estrogen hormone levels cause the membrane that lines the uterus (endometrium) to become thinner. You may have bleeding as the  endometrium thins.  Endometrial hyperplasia. This condition is caused by excess estrogen hormones and low levels of progesterone hormones. The excess estrogen causes the endometrium to thicken, which can lead to bleeding. In some cases, this can lead to cancer of the uterus.  Endometrial cancer.  Non-cancerous growths (polyps) on the endometrium, the lining of the uterus, or the cervix.  Uterine fibroids. These are non-cancerous growths in or around the uterus muscle tissue that can cause heavy bleeding. Any type of postmenopausal bleeding, even if it appears to be a typical menstrual period, should be evaluated by your health care provider. Treatment will depend on the cause of the bleeding. Follow these instructions at home:  Pay attention to any changes in your symptoms.  Avoid using tampons and douches as told by your health care provider.  Change your pads regularly.  Get regular pelvic exams and Pap tests.  Take iron supplements as told by your health care provider.  Take over-the-counter and prescription medicines only as told by your health care provider.  Keep all follow-up visits as told by your health care provider. This is important. Contact a health care provider if:  Your bleeding lasts more than 1 week.  You have abdominal pain.  You have bleeding with or after sexual intercourse.  You have bleeding that happens more  often than every 3 weeks. Get help right away if:  You have a fever, chills, headache, dizziness, muscle aches, and bleeding.  You have severe pain with bleeding.  You are passing blood clots.  You have heavy bleeding, need more than 1 pad an hour, and have never experienced this before.  You feel faint. Summary  Postmenopausal bleeding is any bleeding that a woman has after she has entered into menopause.  Postmenopausal bleeding may have various causes. Treatment will depend on the cause of the bleeding.  Any type of postmenopausal  bleeding, even if it appears to be a typical menstrual period, should be evaluated by your health care provider.  Be sure to pay attention to any changes in your symptoms and keep all follow-up visits as told by your health care provider. This information is not intended to replace advice given to you by your health care provider. Make sure you discuss any questions you have with your health care provider. Document Revised: 07/17/2017 Document Reviewed: 07/03/2016 Elsevier Patient Education  [place] is a procedure to examine the inside of the uterus. This exam uses sound waves that are sent to a computer to make images of the lining of the uterus (endometrium). To get the best images, a germ-free, salt-water solution (sterile saline) is put into the uterus through the vagina. You may have this procedure if you have certain reproductive problems, such as abnormal bleeding, infertility, or miscarriage. This procedure can show what may be causing these problems. Possible causes include scarring or abnormal growths such as fibroids inside your uterus. It can also show if your uterus is an abnormal shape or if the lining of the uterus is too thin. Tell a health care provider about:  All medicines you are taking, including vitamins, herbs, eye drops, creams, and over-the-counter medicines.  Any allergies you have.  Any blood disorders you have.  Any surgeries you have had.  Any medical conditions you have.  Whether you are pregnant or may be pregnant.  The date of the first day of your last period.  Any signs of infection, such as fever, pain in your lower abdomen, or abnormal discharge from your vagina. What are the risks? Generally, this is a safe procedure. However, problems may occur, including:  Abdominal pain or cramping.  Light bleeding (spotting).  Increased vaginal discharge.  Infection. What happens before the  procedure?  Your health care provider may have you take an over-the-counter pain medicine.  You may be given medicine to stop any abnormal bleeding.  You may be given antibiotic medicine to help prevent infection.  You may be asked to take a pregnancy test. This is usually in the form of a urine test.  You may have a pelvic exam.  You will be asked to empty your bladder. What happens during the procedure?  You will lie down on the exam table with your feet in stirrups or with your knees bent and your feet flat on the table.  A slender, handheld device (transducer) will be lubricated and placed into your vagina.  The transducer will be positioned to send sound waves to your uterus. The sound waves are sent to a computer and are turned into images, which your health care provider sees during the procedure.  The transducer will be removed from your vagina.  An instrument will be inserted to widen the opening of your vagina (speculum).  A swab with germ-killing solution (antiseptic) will be  used to clean the opening to your uterus (cervix).  A long, thin tube (catheter) will be placed through your cervix into your uterus.  The speculum will be removed.  The transducer will be placed back into your vagina to take more images.  Your uterus will be filled with a germ-free, salt-water solution (sterile saline) through the catheter. You may feel some cramping.  A fluid that contains air bubbles may be sent through the catheter to make it easier to see the fallopian tubes.  The transducer and catheter will be removed. The procedure may vary among health care providers and hospitals. What happens after the procedure?  It is up to you to get the results of your procedure. Ask your health care provider, or the department that is doing the procedure, when your results will be ready. Summary  A sonohysterogram is a procedure that creates images of the inside of the uterus.  The risks  of this procedure are very low. Most women experience cramping and spotting after the procedure.  You may need to have a pelvic exam and take a pregnancy test before this procedure. This procedure will not be done if you are pregnant or have an infection. This information is not intended to replace advice given to you by your health care provider. Make sure you discuss any questions you have with your health care provider. Document Revised: 03/22/2017 Document Reviewed: 03/05/2016 Elsevier Patient Education  2020 [employer].

## 2019-09-07 NOTE — Telephone Encounter (Signed)
Called patient to discuss results of pelvic ultrasound that was done for postmenopausal bleeding.  The findings on ultrasound were discussed in detail, including the visualization of small myometrial calcified and noncalcified fibroids, endometrium of 2.4 mm thickness with a mass seen in the cavity that may be blocking some fluid posterior to the mass.  I emphasized that the reassuring findings are the small probable inactive myometrial fibroids and the thin endometrial stripe; and the findings that are questionable and must be resolved are the endometrial mass and whether it is cancerous.  I recommended a sonohysterography followed by operative hysteroscopy / D&C with resection of the endometrial mass for pathological evaluation. All questions were answered to her satisfaction.

## 2019-09-10 ENCOUNTER — Telehealth (INDEPENDENT_AMBULATORY_CARE_PROVIDER_SITE_OTHER): Payer: PPO | Admitting: Obstetrics

## 2019-09-10 ENCOUNTER — Encounter: Payer: Self-pay | Admitting: Obstetrics

## 2019-09-10 DIAGNOSIS — F17210 Nicotine dependence, cigarettes, uncomplicated: Secondary | ICD-10-CM | POA: Diagnosis not present

## 2019-09-10 DIAGNOSIS — Z6841 Body Mass Index (BMI) 40.0 and over, adult: Secondary | ICD-10-CM | POA: Diagnosis not present

## 2019-09-10 DIAGNOSIS — N950 Postmenopausal bleeding: Secondary | ICD-10-CM | POA: Diagnosis not present

## 2019-09-10 NOTE — Progress Notes (Signed)
Patient presents for Mychart visit to discuss u/s results. Pt identified with 2 patient identifiers. Patient has no concerns today.

## 2019-09-10 NOTE — Progress Notes (Signed)
GYNECOLOGY VIRTUAL VISIT ENCOUNTER NOTE  Provider location: [facility] [employer]   I connected with Stephanie Fox on 09/10/19 at  1:45 PM EDT by MyChart Video Encounter at home and verified that I am speaking with the correct person using two identifiers.   I discussed the limitations, risks, security and privacy concerns of performing an evaluation and management service virtually and the availability of in person appointments. I also discussed with the patient that there may be a patient responsible charge related to this service. The patient expressed understanding and agreed to proceed.   History:  Stephanie Fox is a 69 y.o. [id number] female being evaluated today for results of ultrasound and management recommendations. She denies any abnormal vaginal discharge, pelvic pain or other concerns.       Past Medical History:  Diagnosis Date  . Anal or rectal pain    per pt / had 3 times  in last 3 years  . Anemia    as a child   . Arthritis   . DDD (degenerative disc disease), lumbosacral    l4-5, S1  . Diabetes mellitus ([place])    type II   . Dyspnea    with exertion   . Edema    lower extremities   . GERD (gastroesophageal reflux disease)    in past  . History of kidney stones   . Hyperlipidemia   . Hypertension   . Low back pain   . Spinal stenosis    Past Surgical History:  Procedure Laterality Date  . BREAST REDUCTION SURGERY  12/2002   Bil  . KNEE SURGERY    . LUMBAR EPIDURAL INJECTION     In Dec 2018,had 4 injection  . REDUCTION MAMMAPLASTY Bilateral 15 years ago  . thumb surgery  1990   right thumb  . 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 underarm surgery  1980's   gland under right arm got infected due to shaving   The following portions of the patient's history were reviewed  and updated as appropriate: allergies, current medications, past family history, past medical history, past social history, past surgical history and problem list.   HEALTH MAINTENANCE:                                                      Last pap smear:  08-28-2019.  Normal. Last mammogram:  09-29-2018.  Normal  Review of Systems:  Pertinent items noted in HPI and remainder of comprehensive ROS otherwise negative.  Physical Exam:   General:  Alert, oriented and cooperative. Patient appears to be in no acute distress.  Mental Status: Normal mood and affect. Normal behavior. Normal judgment and thought content.   Respiratory: Normal respiratory effort, no problems with respiration noted  Rest of physical exam deferred due to type of encounter  Labs and Imaging Results for orders placed or performed in visit on 08/27/19 (from the past 336 hour(s))  Surgical pathology( [place])   Collection Time: 08/28/19 11:28 AM  Result Value Ref Range   SURGICAL PATHOLOGY      SURGICAL PATHOLOGY CASE: [id number] PATIENT: [facility] Report  Clinical History: Postmenopausal bleeding (nt)     FINAL MICROSCOPIC DIAGNOSIS:  A. ENDOMETRIUM, BIOPSY: - Benign endocervical glandular and squamous type epithelium. - No distinct endometrial tissue present for evaluation.   GROSS DESCRIPTION:  Received in formalin is blood tinged mucus that is entirely submitted. Volume: 2 x 2 x 0.5 cm (2 B) SW 08/28/2019    Final Diagnosis performed by Gillie Manners, MD.   Electronically signed 09/01/2019 Technical and / or Professional components performed at [employer]. [facility], [place] [address], [place], [place] [address].  Immunohistochemistry Technical component (if applicable) was performed at [facility]. [address], [place], [place], [place] [address].   IMMUNOHISTOCHEMISTRY DISCLAIMER (if applicable): Some of these  immunohistochemical stains may have been developed and the performance c haracteristics determine by [facility]. Some may not have been cleared or approved by the U.S. Food and Drug Administration. The FDA has determined that such clearance or approval is not necessary. This test is used for clinical purposes. It should not be regarded as investigational or for research. This laboratory is certified under the [place] (CLIA-88) as qualified to perform high complexity clinical laboratory testing.  The controls stained appropriately.    US PELVIC COMPLETE WITH TRANSVAGINAL  Result Date: 09/05/2019 CLINICAL DATA:  Postmenopausal bleeding EXAM: TRANSABDOMINAL AND TRANSVAGINAL ULTRASOUND OF PELVIS TECHNIQUE: Both transabdominal and transvaginal ultrasound examinations of the pelvis were performed. Transabdominal technique was performed for global imaging of the pelvis including uterus, ovaries, adnexal regions, and pelvic cul-de-sac. It was necessary to proceed with endovaginal exam following the transabdominal exam to visualize the uterus endometrium ovaries. COMPARISON:  CT 12/05/2017 FINDINGS: Uterus Measurements: 8.4 x 6.1 x 6.7 cm. = volume: 177.9 mL. Multiple dense myometrial calcifications, likely reflecting calcified fibroids. Arc like submucosal calcification at the uterine fundus. 1.8 x 1.2 x 2 cm anterior submucosal calcified fibroid. Left uterine wall fibroid measuring 2.6 x 1.5 x 2.5 cm. 2.4 by 2 x 2.9 cm exophytic fibroid off the posterior uterus. Multiple additional mildly tree 0 masses. Endometrium Thickness: 2.3 mm. Moderate fluid within the endometrial canal. Polypoid endometrial mass measuring 2.8 x 1.6 x 1.2 cm. Right ovary Measurements: 2.7 x 1.7 x 0.9 cm = volume: 1.4 mL. Normal appearance/no adnexal mass. Left ovary Measurements: 1.8 x 0.8 x 1.6 cm = volume: 1.2 mL. Normal appearance/no adnexal mass. Other findings No abnormal  free fluid. IMPRESSION: 1. Distorted appearance of the uterus secondary to multiple calcified and noncalcified fibroids. 2. Moderate fluid distension of the endometrium, raises possibility of distal obstruction. 2.8 cm polypoid endometrial mass. In the setting of post-menopausal bleeding, endometrial sampling is indicated to exclude carcinoma. If results are benign, sonohysterogram should be considered for focal lesion work-up prior to hysteroscopy. (Ref: Radiological Reasoning: Algorithmic Workup of Abnormal Vaginal Bleeding with Endovaginal Sonography and Sonohysterography. AJR [id number]) Electronically Signed   By: Donavan Foil M.D.   On: 09/05/2019 00:01       Assessment and Plan:     1. Postmenopausal bleeding - Endometrial Biopsy: no endometrial tissue identified - Pelvic Ultrasound: 2.8 cm polypoid endometrial mass with moderate amount of fluid cephalad to it.  Sonohysterogram for lesion work-up was recommended and was scheduled. - will probably need hysteroscopy and D&C in the future   2. Class 3 severe obesity due to excess calories without serious comorbidity with body mass index (BMI) of 40.0 to 44.9 in adult ([place])  3. Tobacco dependence due to cigarettes  I discussed the assessment and treatment plan with the patient. The patient was provided an opportunity to ask questions and all were answered. The patient agreed with the plan and demonstrated an understanding of the instructions.   The patient was advised to call back or seek an in-person evaluation/go to the ED if the symptoms worsen or if the condition fails to improve as anticipated.  I provided 15 minutes of face-to-face time during this encounter.   Baltazar Najjar, MD [facility], [facility] 09/10/2019

## 2019-09-22 ENCOUNTER — Telehealth: Payer: Self-pay | Admitting: *Deleted

## 2019-09-22 ENCOUNTER — Other Ambulatory Visit: Payer: Self-pay | Admitting: *Deleted

## 2019-09-22 DIAGNOSIS — Z87891 Personal history of nicotine dependence: Secondary | ICD-10-CM

## 2019-09-24 ENCOUNTER — Ambulatory Visit
Admission: RE | Admit: 2019-09-24 | Discharge: 2019-09-24 | Disposition: A | Discharge status: Home / Self Care | Payer: PPO | Source: Ambulatory Visit | Attending: Obstetrics | Admitting: Obstetrics

## 2019-09-24 DIAGNOSIS — N950 Postmenopausal bleeding: Secondary | ICD-10-CM

## 2019-09-25 ENCOUNTER — Other Ambulatory Visit: Payer: Self-pay | Admitting: Primary Care

## 2019-10-07 ENCOUNTER — Ambulatory Visit: Payer: PPO | Admitting: Obstetrics & Gynecology

## 2019-10-07 ENCOUNTER — Encounter: Payer: Self-pay | Admitting: Obstetrics & Gynecology

## 2019-10-07 ENCOUNTER — Other Ambulatory Visit: Payer: Self-pay

## 2019-10-07 VITALS — BP 121/82 | HR 101 | Wt 270.0 lb

## 2019-10-07 DIAGNOSIS — N950 Postmenopausal bleeding: Secondary | ICD-10-CM | POA: Insufficient documentation

## 2019-10-07 NOTE — Patient Instructions (Signed)
Hysteroscopy °Hysteroscopy is a procedure that is used to examine the inside of a woman's womb (uterus). This may be done for various reasons, including: °· To look for lumps (tumors) and other growths in the uterus. °· To evaluate abnormal bleeding, fibroid tumors, polyps, scar tissue (adhesions), or cancer of the uterus. °· To determine the cause of an inability to get pregnant (infertility) or repeated losses of pregnancies (miscarriages). °· To find a lost IUD (intrauterine device). °· To perform a procedure that permanently prevents pregnancy (sterilization). °During this procedure, a thin, flexible tube with a small light and camera (hysteroscope) is used to examine the uterus. The camera sends images to a monitor in the room so that your health care provider can view the inside of your uterus. A hysteroscopy should be done right after a menstrual period to make sure that you are not pregnant. °Tell a health care provider about: °· Any allergies you have. °· All medicines you are taking, including vitamins, herbs, eye drops, creams, and over-the-counter medicines. °· Any problems you or family members have had with the use of anesthetic medicines. °· Any blood disorders you have. °· Any surgeries you have had. °· Any medical conditions you have. °· Whether you are pregnant or may be pregnant. °What are the risks? °Generally, this is a safe procedure. However, problems may occur, including: °· Excessive bleeding. °· Infection. °· Damage to the uterus or other structures or organs. °· Allergic reaction to medicines or fluids that are used in the procedure. °What happens before the procedure? °Staying hydrated °Follow instructions from your health care provider about hydration, which may include: °· Up to 2 hours before the procedure - you may continue to drink clear liquids, such as water, clear fruit juice, black coffee, and plain tea. °Eating and drinking restrictions °Follow instructions from your health care  provider about eating and drinking, which may include: °· 8 hours before the procedure - stop eating solid foods and drink clear liquids only °· 2 hours before the procedure - stop drinking clear liquids. °General instructions °· Ask your health care provider about: °? Changing or stopping your normal medicines. This is important if you take diabetes medicines or blood thinners. °? Taking medicines such as aspirin and ibuprofen. These medicines can thin your blood and cause bleeding. Do not take these medicines for 1 week before your procedure, or as told by your health care provider. °· Do not use any products that contain nicotine or tobacco for 2 weeks before the procedure. This includes cigarettes and e-cigarettes. If you need help quitting, ask your health care provider. °· Medicine may be placed in your cervix the day before the procedure. This medicine causes the cervix to have a larger opening (dilate). The larger opening makes it easier for the hysteroscope to be inserted into the uterus during the procedure. °· Plan to have someone with you for the first 24-48 hours after the procedure, especially if you are given a medicine to make you fall asleep (general anesthetic). °· Plan to have someone take you home from the hospital or clinic. °What happens during the procedure? °· To lower your risk of infection: °? Your health care team will wash or sanitize their hands. °? Your skin will be washed with soap. °? Hair may be removed from the surgical area. °· An IV tube will be inserted into one of your veins. °· You may be given one or more of the following: °? A medicine to help   you relax (sedative). °? A medicine that numbs the area around the cervix (local anesthetic). °? A medicine to make you fall asleep (general anesthetic). °· A hysteroscope will be inserted through your vagina and into your uterus. °· Air or fluid will be used to enlarge your uterus, enabling your health care provider to see your uterus  better. The amount of fluid used will be carefully checked throughout the procedure. °· In some cases, tissue may be gently scraped from inside the uterus and sent to a lab for testing (biopsy). °The procedure may vary among health care providers and hospitals. °What happens after the procedure? °· Your blood pressure, heart rate, breathing rate, and blood oxygen level will be monitored until the medicines you were given have worn off. °· You may have some cramping. You may be given medicines for this. °· You may have bleeding, which varies from light spotting to menstrual-like bleeding. This is normal. °· If you had a biopsy done, it is your responsibility to get the results of your procedure. Ask your health care provider, or the department performing the procedure, when your results will be ready. °Summary °· Hysteroscopy is a procedure that is used to examine the inside of a woman's womb (uterus). °· After the procedure, you may have bleeding, which varies from light spotting to menstrual-like bleeding. This is normal. You may also have cramping. °· Plan to have someone take you home from the hospital or clinic. °This information is not intended to replace advice given to you by your health care provider. Make sure you discuss any questions you have with your health care provider. °Document Revised: 03/22/2017 Document Reviewed: 05/08/2016 °Elsevier Patient Education © 2020 [employer] ° °

## 2019-10-07 NOTE — Progress Notes (Signed)
Patient ID: Stephanie Fox, female   DOB: 07/05/1950, 69 y.o.   MRN: [id number]  Chief Complaint  Patient presents with  . Cyst  PMP bleeding  HPI Stephanie Fox is a 69 y.o. female.  [id number] S/p PMP bleeding episode in May, with [place] and [place] showing fibroid and suspected endometrial polyp. Bx done in May was not diagnostic. HPI  Past Medical History:  Diagnosis Date  . Anal or rectal pain    per pt / had 3 times  in last 3 years  . Anemia    as a child   . Arthritis   . DDD (degenerative disc disease), lumbosacral    l4-5, S1  . Diabetes mellitus ([place])    type II   . Dyspnea    with exertion   . Edema    lower extremities   . GERD (gastroesophageal reflux disease)    in past  . History of kidney stones   . Hyperlipidemia   . Hypertension   . Low back pain   . Spinal stenosis     Past Surgical History:  Procedure Laterality Date  . BREAST REDUCTION SURGERY  12/2002   Bil  . KNEE SURGERY    . LUMBAR EPIDURAL INJECTION     In Dec 2018,had 4 injection  . REDUCTION MAMMAPLASTY Bilateral 15 years ago  . thumb surgery  1990   right thumb  . 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 underarm surgery  1980's   gland under right arm got infected due to shaving    Family History  Problem Relation Age of Onset  . Colon cancer Neg Hx     Social History Social History   Tobacco Use  . Smoking status: Former Smoker    Packs/day: 0.50    Types: Cigarettes    Quit date: 06/22/2019    Years since quitting: 0.2  . Smokeless tobacco: Never Used  Substance Use Topics  . Alcohol use: No  . Drug use: No    No Known Allergies  Current Outpatient Medications  Medication Sig Dispense Refill  . ASPIRIN 81 PO aspirin 81 mg tablet,delayed release  Take 1 tablet every day by oral route.    . benzonatate (TESSALON) 100  MG capsule Take 100 mg by mouth 2 (two) times daily as needed for cough.     Marland Kitchen buPROPion (WELLBUTRIN XL) 150 MG 24 hr tablet Take 150 mg by mouth daily.    . Cholecalciferol (VITAMIN D3) 2000 UNITS TABS Take 2,000 Units by mouth daily.     . Dulaglutide (TRULICITY [place]) Inject into the skin once a week.    . Dulaglutide (TRULICITY) 1.5 ON/6.2XB SOPN Inject 1.5 mg into the skin.    Marland Kitchen ezetimibe (ZETIA) 10 MG tablet Take 10 mg by mouth daily.  6  . fluticasone (FLONASE) 50 MCG/ACT nasal spray Place into both nostrils daily.    . furosemide (LASIX) 40 MG tablet Take 40 mg by mouth daily as needed for fluid.     Marland Kitchen glimepiride (AMARYL) 1 MG tablet Take 1 mg by mouth as needed.    . Ipratropium-Albuterol (COMBIVENT) 20-100 MCG/ACT AERS respimat Inhale 1 puff into the lungs every 6 (six) hours. 4 g 1  . Liraglutide (VICTOZA) 18 MG/3ML SOPN Inject 0.6 mg into the skin daily.     . metFORMIN (GLUCOPHAGE) 500  MG tablet Take 500 mg by mouth 2 (two) times daily with a meal.    . montelukast (SINGULAIR) 10 MG tablet TAKE 1 TABLET BY MOUTH EVERYDAY AT BEDTIME 90 tablet 1  . Multiple Vitamin (MULTIVITAMIN) tablet Take 1 tablet by mouth daily. Reported on 07/21/2015    . Nicotine 21-14-7 MG/24HR KIT USE AS DIRECTED (Patient not taking: Reported on 08/27/2019) 56 each 0  . valsartan-hydrochlorothiazide (DIOVAN-HCT) 80-12.5 MG per tablet Take 1 tablet by mouth daily.    . vitamin E 400 UNIT capsule Take 400 Units by mouth 2 (two) times daily.      No current facility-administered medications for this visit.    Review of Systems Review of Systems  Genitourinary: Positive for pelvic pain and vaginal bleeding. Negative for vaginal discharge.    Blood pressure 121/82, pulse (!) 101, weight 270 lb (122.5 kg).  Physical Exam Physical Exam Constitutional:      Appearance: She is obese.  Cardiovascular:     Rate and Rhythm: Normal rate.  Pulmonary:     Effort: Pulmonary effort is normal.  Neurological:      General: No focal deficit present.     Mental Status: She is alert.  Psychiatric:        Mood and Affect: Mood normal.        Behavior: Behavior normal.     Data Reviewed CLINICAL DATA:  69 year old postmenopausal female presents with postmenopausal bleeding and abnormal recent pelvic ultrasound.  EXAM: [place] SONOHYSTEROGRAM  TECHNIQUE: Following cleansing of the cervix and vagina with Betadine, a hysterosalpingogram catheter was placed within the endocervical canal. Sonohysterogram was then performed with transvaginal sonography during infusion of sterile saline solution into the endometrial cavity.  COMPARISON:  09/04/2019 pelvic sonogram.  FINDINGS: Redemonstrated are numerous coarsely calcified uterine fibroids scattered throughout uterus, including a submucosal 2.2 x 1.3 x 1.5 cm calcified left upper uterine body fibroid. There is a lobulated 2.8 x 1.7 x 1.1 cm noncalcified intracavitary polypoid mass with partial internal cystic degeneration and internal vascularity on color Doppler, which appears to arise from the fundal cavity, unchanged from 09/04/2019 pelvic sonogram. No additional noncalcified intracavitary masses.  IMPRESSION: 1. Polypoid 2.8 x 1.7 x 1.1 cm lobulated noncalcified intracavitary mass with internal vascularity and partial internal cystic degeneration, which appears to arise from the fundal cavity, favor an endometrial polyp. Consider directed hysteroscopic biopsy to exclude malignancy. 2. Several coarsely calcified uterine fibroids scattered throughout the uterus, including a submucosal 2.2 cm left upper uterine body calcified fibroid.   Electronically Signed   By: Ilona Sorrel M.D.   On: 09/24/2019 14:58   Assessment PMP bleeding with suspected polyp which needs resection. Patient desires surgical management with hysteroscopy and resection.  The risks of surgery were discussed in detail with the patient including but not limited to:  bleeding which may require transfusion or reoperation; infection which may require prolonged hospitalization or re-hospitalization and antibiotic therapy; injury to bowel, bladder, ureters and major vessels or other surrounding organs; formation of adhesions; need for additional procedures including laparotomy; thromboembolic phenomenon; incisional problems and other postoperative or anesthesia complications.  Patient was told that the likelihood that her condition and symptoms will be treated effectively with this surgical management was very high; the postoperative expectations were also discussed in detail. The patient also understands the alternative treatment options which were discussed in full. All questions were answered.  She was told that she will be contacted by our surgical scheduler regarding the time and date of  her surgery; routine preoperative instructions will be given to her by the preoperative nursing team.   She is aware of need for preoperative COVID testing and subsequent quarantine from time of test to time of surgery; she will be given further preoperative instructions at that [place] screening visit.  Printed patient education handouts about the procedure were given to the patient to review at home.  Plan   hysteroscopy and resection as OP   Emeterio Reeve 10/07/2019, 2:37 PM

## 2019-10-07 NOTE — H&P (View-Only) (Signed)
Patient ID: Stephanie Fox, female   DOB: [birth year], 69 y.o.   MRN: [id number]  Chief Complaint  Patient presents with  . Cyst  PMP bleeding  HPI Stephanie Fox is a 69 y.o. female.  [id number] S/p PMP bleeding episode in May, with [place] and [place] showing fibroid and suspected endometrial polyp. Bx done in May was not diagnostic. HPI  Past Medical History:  Diagnosis Date  . Anal or rectal pain    per pt / had 3 times  in last 3 years  . Anemia    as a child   . Arthritis   . DDD (degenerative disc disease), lumbosacral    l4-5, S1  . Diabetes mellitus ([place])    type II   . Dyspnea    with exertion   . Edema    lower extremities   . GERD (gastroesophageal reflux disease)    in past  . History of kidney stones   . Hyperlipidemia   . Hypertension   . Low back pain   . Spinal stenosis     Past Surgical History:  Procedure Laterality Date  . BREAST REDUCTION SURGERY  12/2002   Bil  . KNEE SURGERY    . LUMBAR EPIDURAL INJECTION     In Dec 2018,had 4 injection  . REDUCTION MAMMAPLASTY Bilateral 15 years ago  . thumb surgery  1990   right thumb  . 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 underarm surgery  1980's   gland under right arm got infected due to shaving    Family History  Problem Relation Age of Onset  . Colon cancer Neg Hx     Social History Social History   Tobacco Use  . Smoking status: Former Smoker    Packs/day: 0.50    Types: Cigarettes    Quit date: 06/22/2019    Years since quitting: 0.2  . Smokeless tobacco: Never Used  Substance Use Topics  . Alcohol use: No  . Drug use: No    No Known Allergies  Current Outpatient Medications  Medication Sig Dispense Refill  . ASPIRIN 81 PO aspirin 81 mg tablet,delayed release  Take 1 tablet every day by oral route.    . benzonatate (TESSALON) 100  MG capsule Take 100 mg by mouth 2 (two) times daily as needed for cough.     Marland Kitchen buPROPion (WELLBUTRIN XL) 150 MG 24 hr tablet Take 150 mg by mouth daily.    . Cholecalciferol (VITAMIN D3) 2000 UNITS TABS Take 2,000 Units by mouth daily.     . Dulaglutide (TRULICITY [place]) Inject into the skin once a week.    . Dulaglutide (TRULICITY) 1.5 CW/2.3JS SOPN Inject 1.5 mg into the skin.    Marland Kitchen ezetimibe (ZETIA) 10 MG tablet Take 10 mg by mouth daily.  6  . fluticasone (FLONASE) 50 MCG/ACT nasal spray Place into both nostrils daily.    . furosemide (LASIX) 40 MG tablet Take 40 mg by mouth daily as needed for fluid.     Marland Kitchen glimepiride (AMARYL) 1 MG tablet Take 1 mg by mouth as needed.    . Ipratropium-Albuterol (COMBIVENT) 20-100 MCG/ACT AERS respimat Inhale 1 puff into the lungs every 6 (six) hours. 4 g 1  . Liraglutide (VICTOZA) 18 MG/3ML SOPN Inject 0.6 mg into the skin daily.     . metFORMIN (GLUCOPHAGE) 500  MG tablet Take 500 mg by mouth 2 (two) times daily with a meal.    . montelukast (SINGULAIR) 10 MG tablet TAKE 1 TABLET BY MOUTH EVERYDAY AT BEDTIME 90 tablet 1  . Multiple Vitamin (MULTIVITAMIN) tablet Take 1 tablet by mouth daily. Reported on 07/21/2015    . Nicotine 21-14-7 MG/24HR KIT USE AS DIRECTED (Patient not taking: Reported on 08/27/2019) 56 each 0  . valsartan-hydrochlorothiazide (DIOVAN-HCT) 80-12.5 MG per tablet Take 1 tablet by mouth daily.    . vitamin E 400 UNIT capsule Take 400 Units by mouth 2 (two) times daily.      No current facility-administered medications for this visit.    Review of Systems Review of Systems  Genitourinary: Positive for pelvic pain and vaginal bleeding. Negative for vaginal discharge.    Blood pressure 121/82, pulse (!) 101, weight 270 lb (122.5 kg).  Physical Exam Physical Exam Constitutional:      Appearance: She is obese.  Cardiovascular:     Rate and Rhythm: Normal rate.  Pulmonary:     Effort: Pulmonary effort is normal.  Neurological:      General: No focal deficit present.     Mental Status: She is alert.  Psychiatric:        Mood and Affect: Mood normal.        Behavior: Behavior normal.     Data Reviewed CLINICAL DATA:  69 year old postmenopausal female presents with postmenopausal bleeding and abnormal recent pelvic ultrasound.  EXAM: [place] SONOHYSTEROGRAM  TECHNIQUE: Following cleansing of the cervix and vagina with Betadine, a hysterosalpingogram catheter was placed within the endocervical canal. Sonohysterogram was then performed with transvaginal sonography during infusion of sterile saline solution into the endometrial cavity.  COMPARISON:  09/04/2019 pelvic sonogram.  FINDINGS: Redemonstrated are numerous coarsely calcified uterine fibroids scattered throughout uterus, including a submucosal 2.2 x 1.3 x 1.5 cm calcified left upper uterine body fibroid. There is a lobulated 2.8 x 1.7 x 1.1 cm noncalcified intracavitary polypoid mass with partial internal cystic degeneration and internal vascularity on color Doppler, which appears to arise from the fundal cavity, unchanged from 09/04/2019 pelvic sonogram. No additional noncalcified intracavitary masses.  IMPRESSION: 1. Polypoid 2.8 x 1.7 x 1.1 cm lobulated noncalcified intracavitary mass with internal vascularity and partial internal cystic degeneration, which appears to arise from the fundal cavity, favor an endometrial polyp. Consider directed hysteroscopic biopsy to exclude malignancy. 2. Several coarsely calcified uterine fibroids scattered throughout the uterus, including a submucosal 2.2 cm left upper uterine body calcified fibroid.   Electronically Signed   By: Ilona Sorrel M.D.   On: 09/24/2019 14:58   Assessment PMP bleeding with suspected polyp which needs resection. Patient desires surgical management with hysteroscopy and resection.  The risks of surgery were discussed in detail with the patient including but not limited to:  bleeding which may require transfusion or reoperation; infection which may require prolonged hospitalization or re-hospitalization and antibiotic therapy; injury to bowel, bladder, ureters and major vessels or other surrounding organs; formation of adhesions; need for additional procedures including laparotomy; thromboembolic phenomenon; incisional problems and other postoperative or anesthesia complications.  Patient was told that the likelihood that her condition and symptoms will be treated effectively with this surgical management was very high; the postoperative expectations were also discussed in detail. The patient also understands the alternative treatment options which were discussed in full. All questions were answered.  She was told that she will be contacted by our surgical scheduler regarding the time and date of  her surgery; routine preoperative instructions will be given to her by the preoperative nursing team.   She is aware of need for preoperative COVID testing and subsequent quarantine from time of test to time of surgery; she will be given further preoperative instructions at that [place] screening visit.  Printed patient education handouts about the procedure were given to the patient to review at home.  Plan   hysteroscopy and resection as OP   Stephanie Fox 10/07/2019, 2:37 PM

## 2019-10-14 ENCOUNTER — Other Ambulatory Visit: Payer: Self-pay

## 2019-10-14 ENCOUNTER — Encounter (HOSPITAL_BASED_OUTPATIENT_CLINIC_OR_DEPARTMENT_OTHER): Payer: Self-pay | Admitting: Obstetrics & Gynecology

## 2019-10-17 ENCOUNTER — Other Ambulatory Visit (HOSPITAL_COMMUNITY): Payer: PPO

## 2019-10-19 ENCOUNTER — Other Ambulatory Visit: Payer: Self-pay | Admitting: Obstetrics & Gynecology

## 2019-10-19 ENCOUNTER — Other Ambulatory Visit (HOSPITAL_COMMUNITY)
Admission: RE | Admit: 2019-10-19 | Discharge: 2019-10-19 | Disposition: A | Discharge status: Home / Self Care | Payer: PPO | Source: Ambulatory Visit | Attending: Obstetrics & Gynecology | Admitting: Obstetrics & Gynecology

## 2019-10-19 ENCOUNTER — Encounter (HOSPITAL_BASED_OUTPATIENT_CLINIC_OR_DEPARTMENT_OTHER)
Admission: RE | Admit: 2019-10-19 | Discharge: 2019-10-19 | Disposition: A | Discharge status: Home / Self Care | Payer: PPO | Source: Ambulatory Visit | Attending: Obstetrics & Gynecology | Admitting: Obstetrics & Gynecology

## 2019-10-19 DIAGNOSIS — J449 Chronic obstructive pulmonary disease, unspecified: Secondary | ICD-10-CM | POA: Diagnosis not present

## 2019-10-19 DIAGNOSIS — E119 Type 2 diabetes mellitus without complications: Secondary | ICD-10-CM | POA: Diagnosis not present

## 2019-10-19 DIAGNOSIS — Z01812 Encounter for preprocedural laboratory examination: Secondary | ICD-10-CM | POA: Insufficient documentation

## 2019-10-19 DIAGNOSIS — M5137 Other intervertebral disc degeneration, lumbosacral region: Secondary | ICD-10-CM | POA: Diagnosis not present

## 2019-10-19 DIAGNOSIS — E785 Hyperlipidemia, unspecified: Secondary | ICD-10-CM | POA: Diagnosis not present

## 2019-10-19 DIAGNOSIS — D250 Submucous leiomyoma of uterus: Secondary | ICD-10-CM | POA: Diagnosis not present

## 2019-10-19 DIAGNOSIS — Z87442 Personal history of urinary calculi: Secondary | ICD-10-CM | POA: Diagnosis not present

## 2019-10-19 DIAGNOSIS — Z20822 Contact with and (suspected) exposure to covid-19: Secondary | ICD-10-CM | POA: Diagnosis not present

## 2019-10-19 DIAGNOSIS — N840 Polyp of corpus uteri: Secondary | ICD-10-CM | POA: Diagnosis not present

## 2019-10-19 DIAGNOSIS — N950 Postmenopausal bleeding: Secondary | ICD-10-CM | POA: Diagnosis not present

## 2019-10-19 DIAGNOSIS — Z6841 Body Mass Index (BMI) 40.0 and over, adult: Secondary | ICD-10-CM | POA: Diagnosis not present

## 2019-10-19 DIAGNOSIS — I10 Essential (primary) hypertension: Secondary | ICD-10-CM | POA: Diagnosis not present

## 2019-10-19 DIAGNOSIS — R0602 Shortness of breath: Secondary | ICD-10-CM | POA: Diagnosis not present

## 2019-10-19 DIAGNOSIS — D649 Anemia, unspecified: Secondary | ICD-10-CM | POA: Diagnosis not present

## 2019-10-19 DIAGNOSIS — K219 Gastro-esophageal reflux disease without esophagitis: Secondary | ICD-10-CM | POA: Diagnosis not present

## 2019-10-19 DIAGNOSIS — Z87891 Personal history of nicotine dependence: Secondary | ICD-10-CM | POA: Diagnosis not present

## 2019-10-19 DIAGNOSIS — M1990 Unspecified osteoarthritis, unspecified site: Secondary | ICD-10-CM | POA: Diagnosis not present

## 2019-10-19 DIAGNOSIS — Z780 Asymptomatic menopausal state: Secondary | ICD-10-CM | POA: Diagnosis not present

## 2019-10-19 DIAGNOSIS — Z7984 Long term (current) use of oral hypoglycemic drugs: Secondary | ICD-10-CM | POA: Diagnosis not present

## 2019-10-19 DIAGNOSIS — Z7982 Long term (current) use of aspirin: Secondary | ICD-10-CM | POA: Diagnosis not present

## 2019-10-19 DIAGNOSIS — M545 Low back pain: Secondary | ICD-10-CM | POA: Diagnosis not present

## 2019-10-19 DIAGNOSIS — Z96653 Presence of artificial knee joint, bilateral: Secondary | ICD-10-CM | POA: Diagnosis not present

## 2019-10-19 DIAGNOSIS — Z79899 Other long term (current) drug therapy: Secondary | ICD-10-CM | POA: Diagnosis not present

## 2019-10-19 LAB — BASIC METABOLIC PANEL
Anion gap: 10 (ref 5–15)
BUN: 16 mg/dL (ref 8–23)
CO2: 23 mmol/L (ref 22–32)
Calcium: 9.8 mg/dL (ref 8.9–10.3)
Chloride: 104 mmol/L (ref 98–111)
Creatinine, Ser: 1.08 mg/dL — ABNORMAL HIGH (ref 0.44–1.00)
GFR calc Af Amer: >60 mL/min (ref >60)
GFR calc non Af Amer: 53 mL/min — ABNORMAL LOW (ref >60)
Glucose, Bld: 144 mg/dL — ABNORMAL HIGH (ref 70–99)
Potassium: 4.1 mmol/L (ref 3.5–5.1)
Sodium: 137 mmol/L (ref 135–145)

## 2019-10-19 LAB — SARS CORONAVIRUS 2 (TAT 6-24 HRS): SARS Coronavirus 2: NEGATIVE

## 2019-10-19 NOTE — Progress Notes (Signed)
Orders for surgery 

## 2019-10-19 NOTE — Progress Notes (Signed)

## 2019-10-20 NOTE — Anesthesia Preprocedure Evaluation (Addendum)
Anesthesia Evaluation  Patient identified by MRN, date of birth, ID band Patient awake    Reviewed: Allergy & Precautions, NPO status , Patient's Chart, lab work & pertinent test results  Airway Mallampati: II  TM Distance: >3 FB Neck ROM: Full    Dental no notable dental hx. (+) Teeth Intact, Dental Advisory Given   Pulmonary shortness of breath, COPD, Patient abstained from smoking., former smoker,    Pulmonary exam normal breath sounds clear to auscultation       Cardiovascular hypertension, Pt. on medications Normal cardiovascular exam Rhythm:Regular Rate:Normal     Neuro/Psych    GI/Hepatic Neg liver ROS, GERD  ,  Endo/Other  diabetes, Type 2Morbid obesity  Renal/GU      Musculoskeletal  (+) Arthritis ,   Abdominal   Peds  Hematology negative hematology ROS (+) anemia ,   Anesthesia Other Findings   Reproductive/Obstetrics                            Anesthesia Physical Anesthesia Plan  ASA: III  Anesthesia Plan: General   Post-op Pain Management:    Induction: Intravenous  PONV Risk Score and Plan: Treatment may vary due to age or medical condition, Ondansetron and Dexamethasone  Airway Management Planned: LMA  Additional Equipment: None  Intra-op Plan:   Post-operative Plan:   Informed Consent: I have reviewed the patients History and Physical, chart, labs and discussed the procedure including the risks, benefits and alternatives for the proposed anesthesia with the patient or authorized representative who has indicated his/her understanding and acceptance.     Dental advisory given  Plan Discussed with: CRNA  Anesthesia Plan Comments:        Anesthesia Quick Evaluation

## 2019-10-21 ENCOUNTER — Other Ambulatory Visit: Payer: Self-pay

## 2019-10-21 ENCOUNTER — Ambulatory Visit (HOSPITAL_BASED_OUTPATIENT_CLINIC_OR_DEPARTMENT_OTHER): Payer: PPO | Admitting: Anesthesiology

## 2019-10-21 ENCOUNTER — Encounter (HOSPITAL_BASED_OUTPATIENT_CLINIC_OR_DEPARTMENT_OTHER)
Admission: RE | Disposition: A | Discharge status: Home / Self Care | Payer: Self-pay | Source: Home / Self Care | Attending: Obstetrics & Gynecology

## 2019-10-21 ENCOUNTER — Ambulatory Visit (HOSPITAL_BASED_OUTPATIENT_CLINIC_OR_DEPARTMENT_OTHER)
Admission: RE | Admit: 2019-10-21 | Discharge: 2019-10-21 | Disposition: A | Discharge status: Home / Self Care | Payer: PPO | Attending: Obstetrics & Gynecology | Admitting: Obstetrics & Gynecology

## 2019-10-21 ENCOUNTER — Encounter (HOSPITAL_BASED_OUTPATIENT_CLINIC_OR_DEPARTMENT_OTHER): Payer: Self-pay | Admitting: Obstetrics & Gynecology

## 2019-10-21 DIAGNOSIS — Z87442 Personal history of urinary calculi: Secondary | ICD-10-CM | POA: Insufficient documentation

## 2019-10-21 DIAGNOSIS — M545 Low back pain: Secondary | ICD-10-CM | POA: Diagnosis not present

## 2019-10-21 DIAGNOSIS — Z87891 Personal history of nicotine dependence: Secondary | ICD-10-CM | POA: Insufficient documentation

## 2019-10-21 DIAGNOSIS — E785 Hyperlipidemia, unspecified: Secondary | ICD-10-CM | POA: Diagnosis not present

## 2019-10-21 DIAGNOSIS — E119 Type 2 diabetes mellitus without complications: Secondary | ICD-10-CM | POA: Insufficient documentation

## 2019-10-21 DIAGNOSIS — Z7984 Long term (current) use of oral hypoglycemic drugs: Secondary | ICD-10-CM | POA: Insufficient documentation

## 2019-10-21 DIAGNOSIS — J449 Chronic obstructive pulmonary disease, unspecified: Secondary | ICD-10-CM | POA: Insufficient documentation

## 2019-10-21 DIAGNOSIS — K219 Gastro-esophageal reflux disease without esophagitis: Secondary | ICD-10-CM | POA: Insufficient documentation

## 2019-10-21 DIAGNOSIS — N950 Postmenopausal bleeding: Secondary | ICD-10-CM

## 2019-10-21 DIAGNOSIS — D250 Submucous leiomyoma of uterus: Secondary | ICD-10-CM | POA: Diagnosis not present

## 2019-10-21 DIAGNOSIS — N840 Polyp of corpus uteri: Secondary | ICD-10-CM | POA: Insufficient documentation

## 2019-10-21 DIAGNOSIS — Z79899 Other long term (current) drug therapy: Secondary | ICD-10-CM | POA: Insufficient documentation

## 2019-10-21 DIAGNOSIS — I10 Essential (primary) hypertension: Secondary | ICD-10-CM | POA: Diagnosis not present

## 2019-10-21 DIAGNOSIS — D649 Anemia, unspecified: Secondary | ICD-10-CM | POA: Insufficient documentation

## 2019-10-21 DIAGNOSIS — Z7982 Long term (current) use of aspirin: Secondary | ICD-10-CM | POA: Insufficient documentation

## 2019-10-21 DIAGNOSIS — M5137 Other intervertebral disc degeneration, lumbosacral region: Secondary | ICD-10-CM | POA: Insufficient documentation

## 2019-10-21 DIAGNOSIS — M1990 Unspecified osteoarthritis, unspecified site: Secondary | ICD-10-CM | POA: Insufficient documentation

## 2019-10-21 DIAGNOSIS — Z780 Asymptomatic menopausal state: Secondary | ICD-10-CM | POA: Insufficient documentation

## 2019-10-21 DIAGNOSIS — R0602 Shortness of breath: Secondary | ICD-10-CM | POA: Insufficient documentation

## 2019-10-21 DIAGNOSIS — Z96653 Presence of artificial knee joint, bilateral: Secondary | ICD-10-CM | POA: Insufficient documentation

## 2019-10-21 DIAGNOSIS — Z6841 Body Mass Index (BMI) 40.0 and over, adult: Secondary | ICD-10-CM | POA: Insufficient documentation

## 2019-10-21 HISTORY — DX: Chronic obstructive pulmonary disease, unspecified: J44.9

## 2019-10-21 HISTORY — PX: DILATATION & CURETTAGE/HYSTEROSCOPY WITH MYOSURE: SHX6511

## 2019-10-21 LAB — GLUCOSE, CAPILLARY
Glucose-Capillary: 140 mg/dL — ABNORMAL HIGH (ref 70–99)
Glucose-Capillary: 116 mg/dL — ABNORMAL HIGH (ref 70–99)

## 2019-10-21 SURGERY — DILATATION & CURETTAGE/HYSTEROSCOPY WITH MYOSURE
Anesthesia: General | Site: Uterus

## 2019-10-21 MED ORDER — BUPIVACAINE HCL (PF) 0.5 % IJ SOLN
INTRAMUSCULAR | Status: AC
  Filled 2019-10-21: qty 30

## 2019-10-21 MED ORDER — OXYCODONE-ACETAMINOPHEN 5-325 MG PO TABS: 1.0000 | ORAL_TABLET | Freq: Four times a day (QID) | ORAL | 0 refills | Status: DC | PRN

## 2019-10-21 MED ORDER — BUPIVACAINE HCL (PF) 0.5 % IJ SOLN
INTRAMUSCULAR | Status: DC | PRN
  Administered 2019-10-21: 10 mL

## 2019-10-21 MED ORDER — PROPOFOL 10 MG/ML IV BOLUS
INTRAVENOUS | Status: DC | PRN
  Administered 2019-10-21: 150 mg via INTRAVENOUS

## 2019-10-21 MED ORDER — MIDAZOLAM HCL 2 MG/2ML IJ SOLN
INTRAMUSCULAR | Status: AC
  Filled 2019-10-21: qty 2

## 2019-10-21 MED ORDER — POVIDONE-IODINE 10 % EX SWAB
2.0000 "application " | Freq: Once | CUTANEOUS | Status: AC
  Administered 2019-10-21: 2 via TOPICAL

## 2019-10-21 MED ORDER — DEXAMETHASONE SODIUM PHOSPHATE 4 MG/ML IJ SOLN
INTRAMUSCULAR | Status: DC | PRN
  Administered 2019-10-21: 10 mg via INTRAVENOUS

## 2019-10-21 MED ORDER — FENTANYL CITRATE (PF) 100 MCG/2ML IJ SOLN
INTRAMUSCULAR | Status: AC
  Filled 2019-10-21: qty 2

## 2019-10-21 MED ORDER — ACETAMINOPHEN 10 MG/ML IV SOLN
1000.0000 mg | Freq: Once | INTRAVENOUS | Status: DC | PRN
  Administered 2019-10-21: 1000 mg via INTRAVENOUS

## 2019-10-21 MED ORDER — LIDOCAINE 2% (20 MG/ML) 5 ML SYRINGE
INTRAMUSCULAR | Status: DC | PRN
  Administered 2019-10-21: 100 mg via INTRAVENOUS

## 2019-10-21 MED ORDER — GLYCOPYRROLATE PF 0.2 MG/ML IJ SOSY
PREFILLED_SYRINGE | INTRAMUSCULAR | Status: AC
  Filled 2019-10-21: qty 1

## 2019-10-21 MED ORDER — PROPOFOL 500 MG/50ML IV EMUL
INTRAVENOUS | Status: AC
  Filled 2019-10-21: qty 50

## 2019-10-21 MED ORDER — DEXAMETHASONE SODIUM PHOSPHATE 10 MG/ML IJ SOLN
INTRAMUSCULAR | Status: AC
  Filled 2019-10-21: qty 1

## 2019-10-21 MED ORDER — FENTANYL CITRATE (PF) 100 MCG/2ML IJ SOLN
25.0000 ug | INTRAMUSCULAR | Status: DC | PRN
  Administered 2019-10-21: 25 ug via INTRAVENOUS

## 2019-10-21 MED ORDER — ACETAMINOPHEN 10 MG/ML IV SOLN
INTRAVENOUS | Status: AC
  Filled 2019-10-21: qty 100

## 2019-10-21 MED ORDER — FENTANYL CITRATE (PF) 100 MCG/2ML IJ SOLN
INTRAMUSCULAR | Status: DC | PRN
  Administered 2019-10-21: 100 ug via INTRAVENOUS

## 2019-10-21 MED ORDER — ONDANSETRON HCL 4 MG/2ML IJ SOLN
INTRAMUSCULAR | Status: DC | PRN
  Administered 2019-10-21: 4 mg via INTRAVENOUS

## 2019-10-21 MED ORDER — LACTATED RINGERS IV SOLN: INTRAVENOUS | Status: DC

## 2019-10-21 MED ORDER — ONDANSETRON HCL 4 MG/2ML IJ SOLN
INTRAMUSCULAR | Status: AC
  Filled 2019-10-21: qty 2

## 2019-10-21 MED ORDER — MIDAZOLAM HCL 5 MG/5ML IJ SOLN
INTRAMUSCULAR | Status: DC | PRN
  Administered 2019-10-21: 2 mg via INTRAVENOUS

## 2019-10-21 MED ORDER — ONDANSETRON HCL 4 MG/2ML IJ SOLN: 4.0000 mg | Freq: Once | INTRAMUSCULAR | Status: DC | PRN

## 2019-10-21 MED ORDER — SODIUM CHLORIDE 0.9 % IR SOLN
Status: DC | PRN
  Administered 2019-10-21: 3000 mL

## 2019-10-21 MED ORDER — LIDOCAINE 2% (20 MG/ML) 5 ML SYRINGE
INTRAMUSCULAR | Status: AC
  Filled 2019-10-21: qty 5

## 2019-10-21 SURGICAL SUPPLY — 18 items
CANISTER SUCT 1200ML W/VALVE (MISCELLANEOUS) ×2 IMPLANT
CATH ROBINSON RED A/P 16FR (CATHETERS) IMPLANT
DEVICE MYOSURE LITE (MISCELLANEOUS) IMPLANT
DEVICE MYOSURE REACH (MISCELLANEOUS) ×2 IMPLANT
GAUZE 4X4 16PLY RFD (DISPOSABLE) ×2 IMPLANT
GLOVE BIO SURGEON STRL SZ 6.5 (GLOVE) ×2 IMPLANT
GLOVE BIOGEL PI IND STRL 7.0 (GLOVE) ×2 IMPLANT
GLOVE BIOGEL PI INDICATOR 7.0 (GLOVE) ×2
GOWN STRL REUS W/ TWL LRG LVL3 (GOWN DISPOSABLE) ×2 IMPLANT
GOWN STRL REUS W/TWL LRG LVL3 (GOWN DISPOSABLE) ×4
KIT PROCEDURE FLUENT (KITS) ×2 IMPLANT
KIT TURNOVER KIT B (KITS) ×2 IMPLANT
PACK VAGINAL MINOR WOMEN LF (CUSTOM PROCEDURE TRAY) ×2 IMPLANT
PAD OB MATERNITY 4.3X12.25 (PERSONAL CARE ITEMS) ×2 IMPLANT
PAD PREP 24X48 CUFFED NSTRL (MISCELLANEOUS) ×2 IMPLANT
SEAL ROD LENS SCOPE MYOSURE (ABLATOR) ×2 IMPLANT
SLEEVE SCD COMPRESS KNEE MED (MISCELLANEOUS) ×2 IMPLANT
TOWEL GREEN STERILE FF (TOWEL DISPOSABLE) ×4 IMPLANT

## 2019-10-21 NOTE — Anesthesia Postprocedure Evaluation (Signed)
Anesthesia Post Note  Patient: Stephanie Fox  Procedure(s) Performed: DILATATION & CURETTAGE/HYSTEROSCOPY WITH MYOSURE (N/A Uterus)     Patient location during evaluation: PACU Anesthesia Type: General Level of consciousness: awake and alert Pain management: pain level controlled Vital Signs Assessment: post-procedure vital signs reviewed and stable Respiratory status: spontaneous breathing, nonlabored ventilation, respiratory function stable and patient connected to nasal cannula oxygen Cardiovascular status: blood pressure returned to baseline and stable Postop Assessment: no apparent nausea or vomiting Anesthetic complications: no   No complications documented.  Last Vitals:  Vitals:   10/21/19 1415 10/21/19 1430  BP: 106/64 111/66  Pulse: 89 90  Resp: 16 20  Temp:    SpO2: 100% 95%    Last Pain:  Vitals:   10/21/19 1430  TempSrc:   PainSc: 5    Pain Goal:                   [place]et [place]

## 2019-10-21 NOTE — Anesthesia Procedure Notes (Signed)
Procedure Name: LMA Insertion Date/Time: 10/21/2019 1:03 PM Performed by: Willa Frater, CRNA Pre-anesthesia Checklist: Patient identified, Emergency Drugs available, Suction available and Patient being monitored Patient Re-evaluated:Patient Re-evaluated prior to induction Oxygen Delivery Method: Circle system utilized Preoxygenation: Pre-oxygenation with 100% oxygen Induction Type: IV induction Ventilation: Mask ventilation without difficulty LMA: LMA inserted LMA Size: 5.0 Number of attempts: 1 Airway Equipment and Method: Bite block Placement Confirmation: positive ETCO2 Tube secured with: Tape Dental Injury: Teeth and Oropharynx as per pre-operative assessment

## 2019-10-21 NOTE — Transfer of Care (Signed)
Immediate Anesthesia Transfer of Care Note  Patient: Stephanie Fox  Procedure(s) Performed: DILATATION & CURETTAGE/HYSTEROSCOPY WITH MYOSURE (N/A Uterus)  Patient Location: PACU  Anesthesia Type:General  Level of Consciousness: awake, alert , oriented and drowsy  Airway & Oxygen Therapy: Patient Spontanous Breathing and Patient connected to nasal cannula oxygen  Post-op Assessment: Report given to RN and Post -op Vital signs reviewed and stable  Post vital signs: Reviewed and stable  Last Vitals:  Vitals Value Taken Time  BP    Temp    Pulse 98 10/21/19 1349  Resp 11 10/21/19 1349  SpO2 98 % 10/21/19 1349  Vitals shown include unvalidated device data.  Last Pain:  Vitals:   10/21/19 1217  TempSrc: Oral  PainSc: 0-No pain         Complications: No complications documented.

## 2019-10-21 NOTE — Discharge Instructions (Signed)
Hysteroscopy, Care After This sheet gives you information about how to care for yourself after your procedure. Your health care provider may also give you more specific instructions. If you have problems or questions, contact your health care provider. What can I expect after the procedure? After the procedure, it is common to have:  Cramping.  Bleeding. This can vary from light spotting to menstrual-like bleeding. Follow these instructions at home: Activity  Rest for 1-2 days after the procedure.  Do not douche, use tampons, or have sex for 2 weeks after the procedure, or until your health care provider approves.  Do not drive for 24 hours after the procedure, or for as long as told by your health care provider.  Do not drive, use heavy machinery, or drink alcohol while taking prescription pain medicines. Medicines   Take over-the-counter and prescription medicines only as told by your health care provider.  Do not take aspirin during recovery. It can increase the risk of bleeding. General instructions  Do not take baths, swim, or use a hot tub until your health care provider approves. Take showers instead of baths for 2 weeks, or for as long as told by your health care provider.  To prevent or treat constipation while you are taking prescription pain medicine, your health care provider may recommend that you: ? Drink enough fluid to keep your urine clear or pale yellow. ? Take over-the-counter or prescription medicines. ? Eat foods that are high in fiber, such as fresh fruits and vegetables, whole grains, and beans. ? Limit foods that are high in fat and processed sugars, such as fried and sweet foods.  Keep all follow-up visits as told by your health care provider. This is important. Contact a health care provider if:  You feel dizzy or lightheaded.  You feel nauseous.  You have abnormal vaginal discharge.  You have a rash.  You have pain that does not get better with  medicine.  You have chills. Get help right away if:  You have bleeding that is heavier than a normal menstrual period.  You have a fever.  You have pain or cramps that get worse.  You develop new abdominal pain.  You faint.  You have pain in your shoulders.  You have shortness of breath. Summary  After the procedure, you may have cramping and some vaginal bleeding.  Do not douche, use tampons, or have sex for 2 weeks after the procedure, or until your health care provider approves.  Do not take baths, swim, or use a hot tub until your health care provider approves. Take showers instead of baths for 2 weeks, or for as long as told by your health care provider.  Report any unusual symptoms to your health care provider.  Keep all follow-up visits as told by your health care provider. This is important. This information is not intended to replace advice given to you by your health care provider. Make sure you discuss any questions you have with your health care provider. Document Revised: 03/22/2017 Document Reviewed: 05/08/2016 Elsevier Patient Education  [place] Instructions  Activity: Get plenty of rest for the remainder of the day. A responsible individual must stay with you for 24 hours following the procedure.  For the next 24 hours, DO NOT: -Drive a car -Paediatric nurse -Drink alcoholic beverages -Take any medication unless instructed by your physician -Make any legal decisions or sign important papers.  Meals: Start with liquid foods  such as gelatin or soup. Progress to regular foods as tolerated. Avoid greasy, spicy, heavy foods. If nausea and/or vomiting occur, drink only clear liquids until the nausea and/or vomiting subsides. Call your physician if vomiting continues.  Special Instructions/Symptoms: Your throat may feel dry or sore from the anesthesia or the breathing tube placed in your throat during surgery. If this  causes discomfort, gargle with warm salt water. The discomfort should disappear within 24 hours.  May have Tylenol at 8pm today 10/21/2019

## 2019-10-21 NOTE — Interval H&P Note (Signed)
History and Physical Interval Note:  10/21/2019 12:44 PM  Stephanie Fox  has presented today for surgery, with the diagnosis of Polyp.  The various methods of treatment have been discussed with the patient and family. After consideration of risks, benefits and other options for treatment, the patient has consented to  Procedure(s): [place] (N/A) as a surgical intervention.  The patient's history has been reviewed, patient examined, no change in status, stable for surgery.  I have reviewed the patient's chart and labs.  Questions were answered to the patient's satisfaction.     Emeterio Reeve

## 2019-10-21 NOTE — Op Note (Signed)
PREOPERATIVE DIAGNOSIS:  Postmenopausal bleeding POSTOPERATIVE DIAGNOSIS: The same PROCEDURE: Hysteroscopy, MyoSure resection of lesion, Dilation and Curettage. SURGEON:  Dr. Emeterio Reeve   INDICATIONS: 69 y.o. G2P0020  here for scheduled surgery for the aforementioned diagnoses.   Risks of surgery were discussed with the patient including but not limited to: bleeding which may require transfusion; infection which may require antibiotics; injury to uterus or surrounding organs; intrauterine scarring which may impair future fertility; need for additional procedures including laparotomy or laparoscopy; and other postoperative/anesthesia complications. Written informed consent was obtained.    FINDINGS:  A 6 week size uterus.  Endometrial polyp. Atrophic endometrium.  Normal ostia bilaterally.  ANESTHESIA:   General, paracervical block with 10 ml of 0.5% Marcaine INTRAVENOUS FLUIDS:  500 ml of LR FLUID DEFICITS:  139ml of Glycine ESTIMATED BLOOD LOSS:  Less than 20 ml SPECIMENS: Endometrial curettings sent to pathology COMPLICATIONS:  None immediate.  PROCEDURE DETAILS:  The patient was then taken to the operating room where general anesthesia was administered and was found to be adequate.  After an adequate timeout was performed, she was placed in the dorsal lithotomy position and examined; then prepped and draped in the sterile manner. A speculum was then placed in the patient's vagina and a single tooth tenaculum was applied to the anterior lip of the cervix.   A paracervical block using 10 ml of 0.5% Marcaine was administered.  The cervix was sounded to 8 cm and dilated manually with metal dilators to accommodate the MyoSure hysteroscope.  Once the cervix was dilated, the hysteroscope was inserted under direct visualization using saline as a suspension medium.  The uterine cavity was carefully examined with the findings as noted above. The MyoSure was used to fully resect the polyp.  After further  careful visualization of the uterine cavity, the hysteroscope was removed under direct visualization.  A sharp curettage was then performed to obtain a small amount of endometrial curettings.  The tenaculum was removed from the anterior lip of the cervix and the vaginal speculum was removed after noting good hemostasis.  The patient tolerated the procedure well and was taken to the recovery area awake, extubated and in stable condition.   The patient will be discharged to home as per PACU criteria.  Routine postoperative instructions given.  She was prescribed Percocet. She will follow up in the office  for postoperative evaluation.  Woodroe Mode, MD 10/21/2019

## 2019-10-22 ENCOUNTER — Encounter (HOSPITAL_BASED_OUTPATIENT_CLINIC_OR_DEPARTMENT_OTHER): Payer: Self-pay | Admitting: Obstetrics & Gynecology

## 2019-10-22 LAB — SURGICAL PATHOLOGY

## 2019-10-22 NOTE — Addendum Note (Signed)
Addendum  created 10/22/19 1220 by Zong Mcquarrie, Ernesta Amble, CRNA   Charge Capture section accepted

## 2019-10-29 ENCOUNTER — Telehealth: Payer: Self-pay

## 2019-10-29 NOTE — Telephone Encounter (Signed)
S/w pt and answered questions regarding recent surgery, advised pt that scheduler will call for follow up appt.

## 2019-11-02 ENCOUNTER — Ambulatory Visit (INDEPENDENT_AMBULATORY_CARE_PROVIDER_SITE_OTHER): Payer: PPO | Admitting: Acute Care

## 2019-11-02 ENCOUNTER — Other Ambulatory Visit: Payer: Self-pay

## 2019-11-02 ENCOUNTER — Ambulatory Visit (INDEPENDENT_AMBULATORY_CARE_PROVIDER_SITE_OTHER)
Admission: RE | Admit: 2019-11-02 | Discharge: 2019-11-02 | Disposition: A | Discharge status: Home / Self Care | Payer: PPO | Source: Ambulatory Visit | Attending: Acute Care | Admitting: Acute Care

## 2019-11-02 ENCOUNTER — Encounter: Payer: Self-pay | Admitting: Acute Care

## 2019-11-02 DIAGNOSIS — Z87891 Personal history of nicotine dependence: Secondary | ICD-10-CM

## 2019-11-02 DIAGNOSIS — Z01811 Encounter for preprocedural respiratory examination: Secondary | ICD-10-CM | POA: Insufficient documentation

## 2019-11-02 DIAGNOSIS — E119 Type 2 diabetes mellitus without complications: Secondary | ICD-10-CM | POA: Diagnosis not present

## 2019-11-02 DIAGNOSIS — E7800 Pure hypercholesterolemia, unspecified: Secondary | ICD-10-CM | POA: Diagnosis not present

## 2019-11-02 DIAGNOSIS — Z122 Encounter for screening for malignant neoplasm of respiratory organs: Secondary | ICD-10-CM | POA: Insufficient documentation

## 2019-11-02 NOTE — Patient Instructions (Signed)
Thank you for participating in the [facility]. It was our pleasure to meet you today. We will call you with the results of your scan within the next few days. Your scan will be assigned a Lung RADS category score by the physicians reading the scans.  This Lung RADS score determines follow up scanning.  See below for description of categories, and follow up screening recommendations. We will be in touch to schedule your follow up screening annually or based on recommendations of our providers. We will fax a copy of your scan results to your Primary Care Physician, or the physician who referred you to the program, to ensure they have the results. Please call the office if you have any questions or concerns regarding your scanning experience or results.  Our office number is [phone]. Please speak with Denise Phelps, RN. She is our Lung Cancer Screening RN. If she is unavailable when you call, please have the office staff send her a message. She will return your call at her earliest convenience. Remember, if your scan is normal, we will scan you annually as long as you continue to meet the criteria for the program. (Age 55-77, Current smoker or smoker who has quit within the last 15 years). If you are a smoker, remember, quitting is the single most powerful action that you can take to decrease your risk of lung cancer and other pulmonary, breathing related problems. We know quitting is hard, and we are here to help.  Please let us know if there is anything we can do to help you meet your goal of quitting. If you are a former smoker, congratulations. We are proud of you! Remain smoke free! Remember you can refer friends or family members through the number above.  We will screen them to make sure they meet criteria for the program. Thank you for helping us take better care of you by participating in Lung Screening.  Lung RADS Categories:  Lung RADS 1: no nodules  or definitely non-concerning nodules.  Recommendation is for a repeat annual scan in 12 months.  Lung RADS 2:  nodules that are non-concerning in appearance and behavior with a very low likelihood of becoming an active cancer. Recommendation is for a repeat annual scan in 12 months.  Lung RADS 3: nodules that are probably non-concerning , includes nodules with a low likelihood of becoming an active cancer.  Recommendation is for a 6-month repeat screening scan. Often noted after an upper respiratory illness. We will be in touch to make sure you have no questions, and to schedule your 6-month scan.  Lung RADS 4 A: nodules with concerning findings, recommendation is most often for a follow up scan in 3 months or additional testing based on our provider's assessment of the scan. We will be in touch to make sure you have no questions and to schedule the recommended 3 month follow up scan.  Lung RADS 4 B:  indicates findings that are concerning. We will be in touch with you to schedule additional diagnostic testing based on our provider's  assessment of the scan.   

## 2019-11-02 NOTE — Progress Notes (Signed)
Shared Decision Making Visit Lung Cancer Screening Program [phone])   Eligibility:  Age 69 y.o.  Pack Years Smoking History Calculation 30 pack year smoking history (# packs/per year x # years smoked)  Recent History of coughing up blood  no  Unexplained weight loss? no ( >Than 15 pounds within the last 6 months )  Prior History Lung / other cancer no (Diagnosis within the last 5 years already requiring surveillance chest CT Scans).  Smoking Status Former Smoker  Former Smokers: Years since quit: < 1 year  Quit Date: 06/22/2019  Visit Components:  Discussion included one or more decision making aids. yes  Discussion included risk/benefits of screening. yes  Discussion included potential follow up diagnostic testing for abnormal scans. yes  Discussion included meaning and risk of over diagnosis. yes  Discussion included meaning and risk of False Positives. yes  Discussion included meaning of total radiation exposure. yes  Counseling Included:  Importance of adherence to annual lung cancer LDCT screening. yes  Impact of comorbidities on ability to participate in the program. yes  Ability and willingness to under diagnostic treatment. yes  Smoking Cessation Counseling:  Current Smokers:   Discussed importance of smoking cessation. yes  Information about tobacco cessation classes and interventions provided to patient. yes  Patient provided with "ticket" for LDCT Scan. yes  Symptomatic Patient. no  Counseling NA  Diagnosis Code: Tobacco Use Z72.0  Asymptomatic Patient yes  Counseling (Intermediate counseling: > three minutes counseling) U9323  Former Smokers:   Discussed the importance of maintaining cigarette abstinence. yes  Diagnosis Code: Personal History of Nicotine Dependence. F57.322  Information about tobacco cessation classes and interventions provided to patient. Yes  Patient provided with "ticket" for LDCT Scan. yes  Written Order for Lung  Cancer Screening with LDCT placed in Epic. Yes (CT Chest Lung Cancer Screening Low Dose W/O CM) [id number] Z12.2-Screening of respiratory organs Z87.891-Personal history of nicotine dependence  I spent 25 minutes of face to face time with Stephanie Fox discussing the risks and benefits of lung cancer screening. We viewed a power point together that explained in detail the above noted topics. We took the time to pause the power point at intervals to allow for questions to be asked and answered to ensure understanding. We discussed that she had taken the single most powerful action possible to decrease her risk of developing lung cancer when she quit smoking. I counseled her to remain smoke free, and to contact me if she ever had the desire to smoke again so that I can provide resources and tools to help support the effort to remain smoke free. We discussed the time and location of the scan, and that either  Stephanie Glassman RN or I will call with the results within  24-48 hours of receiving them. She has my card and contact information in the event she needs to speak with me, in addition to a copy of the power point we reviewed as a resource. She verbalized understanding of all of the above and had no further questions upon leaving the office.     I explained to the patient that there has been a high incidence of coronary artery disease noted on these exams. I explained that this is a non-gated exam therefore degree or severity cannot be determined. This patient is  Not on statin therapy. I have asked the patient to follow-up with their PCP regarding any incidental finding of coronary artery disease and management with diet or medication as they  feel is clinically indicated. The patient verbalized understanding of the above and had no further questions.     Magdalen Spatz, NP 11/02/2019

## 2019-11-03 ENCOUNTER — Other Ambulatory Visit: Payer: Self-pay | Admitting: *Deleted

## 2019-11-03 DIAGNOSIS — Z87891 Personal history of nicotine dependence: Secondary | ICD-10-CM

## 2019-11-03 NOTE — Progress Notes (Signed)
Please call patient and let them  know their  low dose Ct was read as a Lung RADS 2: nodules that are benign in appearance and behavior with a very low likelihood of becoming a clinically active cancer due to size or lack of growth. Recommendation per radiology is for a repeat LDCT in 12 months. .Please let them  know we will order and schedule their  annual screening scan for 10/2020. Please let them  know there was notation of CAD on their  scan.  Please remind the patient  that this is a non-gated exam therefore degree or severity of disease  cannot be determined. Please have them  follow up with their PCP regarding potential risk factor modification, dietary therapy or pharmacologic therapy if clinically indicated. Pt.  is not  currently on statin therapy. Please place order for annual  screening scan for  10/2020 and fax results to PCP. Thanks so much.  Langley Gauss, please encourage patient to follow up with her PCP regarding three vessel CAD. She would most likely benefit from am echo to eval for pulmonary HTN too. Thanks

## 2019-11-09 DIAGNOSIS — E119 Type 2 diabetes mellitus without complications: Secondary | ICD-10-CM | POA: Diagnosis not present

## 2019-11-09 DIAGNOSIS — I10 Essential (primary) hypertension: Secondary | ICD-10-CM | POA: Diagnosis not present

## 2019-11-09 DIAGNOSIS — E7800 Pure hypercholesterolemia, unspecified: Secondary | ICD-10-CM | POA: Diagnosis not present

## 2019-11-17 ENCOUNTER — Other Ambulatory Visit: Payer: Self-pay | Admitting: Family Medicine

## 2019-11-17 DIAGNOSIS — Z1231 Encounter for screening mammogram for malignant neoplasm of breast: Secondary | ICD-10-CM

## 2019-11-18 ENCOUNTER — Ambulatory Visit (INDEPENDENT_AMBULATORY_CARE_PROVIDER_SITE_OTHER): Payer: PPO | Admitting: Obstetrics & Gynecology

## 2019-11-18 ENCOUNTER — Encounter: Payer: Self-pay | Admitting: Obstetrics & Gynecology

## 2019-11-18 ENCOUNTER — Other Ambulatory Visit: Payer: Self-pay

## 2019-11-18 VITALS — BP 119/79 | HR 97 | Wt 263.0 lb

## 2019-11-18 DIAGNOSIS — N939 Abnormal uterine and vaginal bleeding, unspecified: Secondary | ICD-10-CM

## 2019-11-18 DIAGNOSIS — Z9889 Other specified postprocedural states: Secondary | ICD-10-CM

## 2019-11-18 DIAGNOSIS — N950 Postmenopausal bleeding: Secondary | ICD-10-CM

## 2019-11-18 NOTE — Progress Notes (Signed)
Patient ID: Stephanie Fox, female   DOB: [birth year], 69 y.o.   MRN: [id number] Subjective:     Stephanie Fox is a 69 y.o. female who presents to the clinic 4 weeks status post operative hysteroscopy for abnormal uterine bleeding. Eating a regular diet without difficulty. Bowel movements are normal. The patient is not having any pain.  The following portions of the patient's history were reviewed and updated as appropriate: allergies, current medications, past family history, past medical history, past social history, past surgical history and problem list.  Review of Systems Pertinent items are noted in HPI.    Objective:    BP 119/79   Pulse 97   Wt (!) 263 lb (119.3 kg)   BMI 41.19 kg/m  General:  alert, cooperative and no distress  Abdomen: Not distended   Assessment:    Doing well postoperatively. Operative findings again reviewed. Pathology report discussed.    Plan:    1. Continue any current medications. 2. Wound care discussed. 3. Activity restrictions: none 4. Anticipated return to work: now. 5. Follow up:prn  Woodroe Mode, MD 11/18/2019

## 2019-11-18 NOTE — Patient Instructions (Signed)
Postmenopausal Bleeding  Postmenopausal bleeding is any bleeding that occurs after menopause. Menopause is when a woman's period stops. Any type of bleeding after menopause should be checked by your doctor. Treatment will depend on the cause. Follow these instructions at home:  Pay attention to any changes in your symptoms.  Avoid using tampons and douches as told by your doctor.  Change your pads regularly.  Get regular pelvic exams and Pap tests.  Take iron pills as told by your doctor.  Take over-the-counter and prescription medicines only as told by your doctor.  Keep all follow-up visits as told by your doctor. This is important. Contact a doctor if:  Your bleeding lasts for more than 1 week.  You have pain in your belly (abdomen).  You have bleeding during or after sex.  You have bleeding that happens more often than every 3 weeks. Get help right away if:  You have fever, chills, headache, dizziness, muscle aches, or bleeding.  You have very bad pain with bleeding.  You have clumps of blood (blood clots) coming from your vagina.  You have a lot of bleeding, and: ? You use more than 1 pad an hour. ? This kind of bleeding has never happened before.  You feel like you are going to pass out (faint). Summary  Any type of bleeding after menopause should be checked by your doctor.  Pay attention to any changes in your symptoms.  Keep all follow-up visits as told by your doctor. This information is not intended to replace advice given to you by your health care provider. Make sure you discuss any questions you have with your health care provider. Document Revised: 06/26/2018 Document Reviewed: 05/15/2016 Elsevier Patient Education  2020 Elsevier Inc.  

## 2019-11-18 NOTE — Progress Notes (Signed)
RGYN pt present for Post Op visit today.  10/21/19: Hysteroscopy, MyoSure resection of lesion, Dilation and Curettage.  Pt states she is doing well. Pt denies any bleeding. Pt states she has an uncomfortable feeling. States she will sometimes have a feeling as if she is going to start her period.

## 2019-11-20 ENCOUNTER — Other Ambulatory Visit: Payer: Self-pay

## 2019-11-20 ENCOUNTER — Ambulatory Visit
Admission: RE | Admit: 2019-11-20 | Discharge: 2019-11-20 | Disposition: A | Discharge status: Home / Self Care | Payer: PPO | Source: Ambulatory Visit | Attending: Family Medicine | Admitting: Family Medicine

## 2019-11-20 DIAGNOSIS — Z1231 Encounter for screening mammogram for malignant neoplasm of breast: Secondary | ICD-10-CM | POA: Diagnosis not present

## 2020-02-02 ENCOUNTER — Other Ambulatory Visit: Payer: Self-pay | Admitting: *Deleted

## 2020-02-02 NOTE — Patient Outreach (Signed)
  [facility]) Care Management Chronic Special Needs Program    02/02/2020  Name: Stephanie Fox, DOB: 1951-01-10  MRN: [id number]   [name] is enrolled in a chronic special needs plan for Diabetes.  Outreach call to client for initial telephone outreach, no answer to telephone.  PLAN Outreach client in 1-2 weeks  [name] [facility], BSN [place], [place]

## 2020-02-10 ENCOUNTER — Other Ambulatory Visit: Payer: Self-pay | Admitting: *Deleted

## 2020-02-10 NOTE — Patient Outreach (Signed)
  [facility]) Care Management Chronic Special Needs Program    02/10/2020  Name: Keashia Haskins, DOB: 08-30-1950  MRN: [id number]   Ms. Kearsten Ginther is enrolled in a chronic special needs plan for Diabetes.  Outreach call to client for initial telephone assessment/ 2nd attempt, no answer to telephone.  PLAN Outreach client in 1-2 weeks  [name] [facility], BSN [place], [place]

## 2020-02-22 ENCOUNTER — Other Ambulatory Visit: Payer: Self-pay | Admitting: *Deleted

## 2020-02-22 NOTE — Patient Outreach (Addendum)
[facility]) Care Management Chronic Special Needs Program    02/22/2020  Name: Stephanie Fox, DOB: Nov 05, 1950  MRN: [id number]   Ms. Stephanie Fox is enrolled in a chronic special needs plan for Diabetes.  Outreach call to client (3rd attempt).  No answer to telephone.  Goals    . Client understands the importance of follow-up with providers by attending scheduled visits     Please continue following up with your health care provider.    Marland Kitchen HEMOGLOBIN A1C < 7     Your last documented AIC is 7.8 on 11/02/19.  Have your [facility] checked every 6 months if you are at goal or every 3 months if you are not at goal. Check blood sugars daily before eating with goal of 80-130.  You can also check 1 1/2 hours after eating with goal of 180 or less. Plan to eat low carbohydrate and low salt meals, watch portion sizes and avoid sugar sweetened drinks.  Discussed carbohydrate control meals. Reviewed signs and symptoms of hyperglycemia (high blood sugar) and hypoglycemia (low blood sugar) and actions to take. Review [employer] calendar (sent in the mail) for diabetes action plan in the back. Increase activity only if you are able to do it.  Follow doctor recommendations. EMMI education provided on "Diabetes and Diet"   .  Review and plan to discuss with RN during next telephonic assessment.  Use 24 hour nurse advice line as needed at [phone]     . Maintain timely refills of diabetic medication as prescribed within the year .     Contact your RN care manager if you have questions about medicines. It is important to take your medications as prescribed.      . Obtain annual  Lipid Profile, LDL-C     Per medical record review, Lipid profile completed on 04/29/19 LDL= 133 The goal for LDL is less than 70mg /dl as you are at high risk for complications. Try to avoid saturated fats, trans-fats and eat more fiber. Plan to take statin (cholesterol) medicine as ordered.      .  Obtain Annual Eye (retinal)  Exam      Your last documented eye exam was on 04/17/18. Diabetes can affect your vision.  Plan to have a dilated eye exam every year. Advised client to keep and/ or schedule appointment with eye doctor. Continue to use your eye drops as prescribed.      . Obtain Annual Foot Exam     Your doctor should check your bare feet at each visit. Diabetes can affect the nerves in your feet, causing decreased feeling or numbness. Check your feet and in-between toes daily for cuts, bruises, redness, blisters or sores.  If you cannot reach them, use a mirror. Wash feet with soap and water, dry feet well especially between toes.  Don't use too much lotion. Wear shoes that are not too tight and don't walk barefoot.      . Obtain annual screen for micro albuminuria (urine) , nephropathy (kidney problems)     Diabetes can affect your kidneys. It is important for your doctor to check your urine at least once a year  These tests show how your kidneys are working.     . Obtain Hemoglobin A1C at least 2 times per year     Continue to have Hgb AIC checked at least twice yearly    . Visit Primary Care Provider or Endocrinologist at least 2 times per year  Client saw endocrinologist on 11/09/19 Continue to follow up with your primary care provider       PLAN RN care manager faxed today's note and individualized care plan to primary care provider, mailed individualized care plan to client along with eduction materials, consent form, 24 hour nurse line magnet, unsuccessful outreach letter.  Outreach client in 9-12 months (per Tier 1)   [name] [facility], BSN [place], [place]

## 2020-02-23 DIAGNOSIS — Z20822 Contact with and (suspected) exposure to covid-19: Secondary | ICD-10-CM | POA: Diagnosis not present

## 2020-02-26 ENCOUNTER — Telehealth: Payer: Self-pay | Admitting: Primary Care

## 2020-02-26 MED ORDER — PREDNISONE 20 MG PO TABS: 40.0000 mg | ORAL_TABLET | Freq: Every day | ORAL | 0 refills | Status: AC

## 2020-02-26 NOTE — Telephone Encounter (Signed)
Primary Pulmonologist: Mannam Last office visit and with whom: 07/20/2019 What do we see them for (pulmonary problems): COPD Last OV assessment/plan: Assessment and Plan: Slow to Resolve COPD exacerbation Better after pred taper/ z pack and Doxy, but cough is lingering Plan Congratulations on quitting smoking. I am doing a happy dance Remember to continue your Stialto every day without fail. Take 2 puffs daily Rinse mouth after use. This is your maintenance medication We will have a Stialto sample ready for you tomorrow at the office. Use the Combivent for breakthrough cough or shortness of breath/ wheezing. We will prescribe a maintenance inhaler that is preferred by your insurance at your follow up in 2 weeks ( Breo and Anoro are preferred, but are powder and may irritate her cough) Continue Singulair 10 mg every day. Sips of water instead of throat clearing Sugar Free [employer] or [employer] originals for throat soothing. Delsym Cough syrup 5 cc's every 12 hours Non-sedating antihistamine of your choice daily ( Zyrtec, Allegra, Xyzol, Claritin ( Generic ok)>> take this in the morning and your Singulair in the evening. Use your Flonase 2 sprays each nare daily in the morning. Make sure you blow your nose before using the Flonase so that it makes contact with your mucus membranes.  Follow up in 2 weeks with Derl Barrow 08/03/2019 at 3 pm Please contact office for sooner follow up if symptoms do not improve or worsen or seek emergency care . Call us sooner if you need [place]. Remember to get your Covid vaccine if you have not already done so.   Pulmonary Nodule Will be followed by the screening program starting 09/2019. Scan is due 09/2019      Follow Up Instructions: Follow up video visit with Derl Barrow NP, 08/03/2019 at 3 pm  Call sooner if you need [place] sooner.  I discussed the assessment and treatment plan with the patient. The patient was provided an opportunity to ask  questions and all were answered. The patient agreed with the plan and demonstrated an understanding of the instructions.  The patient was advised to call back or seek an in-person evaluation if the symptoms worsen or if the condition fails to improve as anticipated.   Was appointment offered to patient (explain)?  No, sent to provider.  Patient out of town, in [place].   Reason for call: Cough with clear mucus, head congestion and running nose since Monday 02/22/20.  Denies any chills, achy muscles or fever.  Has had covid vaccines, had booster 3 weeks.  Negative covid test on Tuesday.  Denies any sob.  Has been taking Delsym, it calms the coughing for about 6 hours.  Taking Benzonatate tablets, they are working well, but does not stop the cough  Taking her singular and Zyrtec.  She wants something for the cough, sneezing and running nose.  Dr. Vaughan Browner please advise.  (examples of things to ask: : When did symptoms start? Fever? Cough? Productive? Color to sputum? More sputum than usual? Wheezing? Have you needed increased oxygen? Are you taking your respiratory medications? What over the counter measures have you tried?)  No Known Allergies  Immunization History  Administered Date(s) Administered   Fluad Quad(high Dose 65+) 01/01/2019   Influenza, Seasonal, Injecte, Preservative Fre 02/04/2017   PFIZER SARS-COV-2 Vaccination 07/01/2019, 07/23/2019   Pneumococcal Conjugate-13 08/07/2017   Pneumococcal Polysaccharide-23 02/28/2014

## 2020-02-26 NOTE — Telephone Encounter (Signed)
Prednisone 40mg /day for 5 days

## 2020-02-26 NOTE — Telephone Encounter (Signed)
Called and spoke with patient to let her know we were sending in RX for Prednisone. She verified pharmacy. RX sent. Nothing further needed at this time.

## 2020-03-29 ENCOUNTER — Other Ambulatory Visit: Payer: Self-pay | Admitting: *Deleted

## 2020-03-29 NOTE — Patient Outreach (Signed)
  [facility]) Care Management Chronic Special Needs Program    03/29/2020  Name: Stephanie Fox, DOB: 02-25-1951  MRN: [id number]   [name] is enrolled in a chronic special needs plan for Diabetes.  [facility] care management will continue to provide services for this member through 04/22/20.  The [facility] care management team will assume care 04/23/20.   [name] [facility], BSN [place], [place]

## 2020-04-20 DIAGNOSIS — E7800 Pure hypercholesterolemia, unspecified: Secondary | ICD-10-CM | POA: Diagnosis not present

## 2020-04-20 DIAGNOSIS — E119 Type 2 diabetes mellitus without complications: Secondary | ICD-10-CM | POA: Diagnosis not present

## 2020-04-25 DIAGNOSIS — E7800 Pure hypercholesterolemia, unspecified: Secondary | ICD-10-CM | POA: Diagnosis not present

## 2020-04-25 DIAGNOSIS — E119 Type 2 diabetes mellitus without complications: Secondary | ICD-10-CM | POA: Diagnosis not present

## 2020-04-25 DIAGNOSIS — I10 Essential (primary) hypertension: Secondary | ICD-10-CM | POA: Diagnosis not present

## 2020-04-25 DIAGNOSIS — Z789 Other specified health status: Secondary | ICD-10-CM | POA: Diagnosis not present

## 2020-04-28 ENCOUNTER — Other Ambulatory Visit: Payer: Self-pay | Admitting: *Deleted

## 2020-05-04 DIAGNOSIS — Z79899 Other long term (current) drug therapy: Secondary | ICD-10-CM | POA: Diagnosis not present

## 2020-05-04 DIAGNOSIS — I10 Essential (primary) hypertension: Secondary | ICD-10-CM | POA: Diagnosis not present

## 2020-05-04 DIAGNOSIS — I270 Primary pulmonary hypertension: Secondary | ICD-10-CM | POA: Diagnosis not present

## 2020-05-04 DIAGNOSIS — Z7984 Long term (current) use of oral hypoglycemic drugs: Secondary | ICD-10-CM | POA: Diagnosis not present

## 2020-05-04 DIAGNOSIS — G720 Drug-induced myopathy: Secondary | ICD-10-CM | POA: Diagnosis not present

## 2020-05-04 DIAGNOSIS — E559 Vitamin D deficiency, unspecified: Secondary | ICD-10-CM | POA: Diagnosis not present

## 2020-05-04 DIAGNOSIS — E7800 Pure hypercholesterolemia, unspecified: Secondary | ICD-10-CM | POA: Diagnosis not present

## 2020-05-04 DIAGNOSIS — I700 Atherosclerosis of aorta: Secondary | ICD-10-CM | POA: Diagnosis not present

## 2020-05-04 DIAGNOSIS — Z0000 Encounter for general adult medical examination without abnormal findings: Secondary | ICD-10-CM | POA: Diagnosis not present

## 2020-05-04 DIAGNOSIS — E1169 Type 2 diabetes mellitus with other specified complication: Secondary | ICD-10-CM | POA: Diagnosis not present

## 2020-05-04 DIAGNOSIS — Z23 Encounter for immunization: Secondary | ICD-10-CM | POA: Diagnosis not present

## 2020-05-04 DIAGNOSIS — I2510 Atherosclerotic heart disease of native coronary artery without angina pectoris: Secondary | ICD-10-CM | POA: Diagnosis not present

## 2020-06-06 ENCOUNTER — Other Ambulatory Visit: Payer: Self-pay | Admitting: Gastroenterology

## 2020-06-06 DIAGNOSIS — K7689 Other specified diseases of liver: Secondary | ICD-10-CM | POA: Diagnosis not present

## 2020-06-06 DIAGNOSIS — R1013 Epigastric pain: Secondary | ICD-10-CM | POA: Diagnosis not present

## 2020-06-06 DIAGNOSIS — R933 Abnormal findings on diagnostic imaging of other parts of digestive tract: Secondary | ICD-10-CM | POA: Diagnosis not present

## 2020-06-06 DIAGNOSIS — R109 Unspecified abdominal pain: Secondary | ICD-10-CM

## 2020-06-23 ENCOUNTER — Other Ambulatory Visit: Payer: HMO

## 2020-06-27 ENCOUNTER — Other Ambulatory Visit: Payer: HMO

## 2020-07-06 ENCOUNTER — Other Ambulatory Visit: Payer: Self-pay

## 2020-07-06 ENCOUNTER — Ambulatory Visit
Admission: RE | Admit: 2020-07-06 | Discharge: 2020-07-06 | Disposition: A | Discharge status: Home / Self Care | Payer: HMO | Source: Ambulatory Visit | Attending: Gastroenterology | Admitting: Gastroenterology

## 2020-07-06 DIAGNOSIS — K5730 Diverticulosis of large intestine without perforation or abscess without bleeding: Secondary | ICD-10-CM | POA: Diagnosis not present

## 2020-07-06 DIAGNOSIS — D259 Leiomyoma of uterus, unspecified: Secondary | ICD-10-CM | POA: Diagnosis not present

## 2020-07-06 DIAGNOSIS — R109 Unspecified abdominal pain: Secondary | ICD-10-CM

## 2020-07-06 DIAGNOSIS — K7689 Other specified diseases of liver: Secondary | ICD-10-CM | POA: Diagnosis not present

## 2020-07-06 DIAGNOSIS — I700 Atherosclerosis of aorta: Secondary | ICD-10-CM | POA: Diagnosis not present

## 2020-07-06 MED ORDER — IOPAMIDOL (ISOVUE-300) INJECTION 61%
100.0000 mL | Freq: Once | INTRAVENOUS | Status: AC | PRN
  Administered 2020-07-06: 100 mL via INTRAVENOUS

## 2020-07-13 DIAGNOSIS — E1140 Type 2 diabetes mellitus with diabetic neuropathy, unspecified: Secondary | ICD-10-CM | POA: Diagnosis not present

## 2020-07-13 DIAGNOSIS — R413 Other amnesia: Secondary | ICD-10-CM | POA: Diagnosis not present

## 2020-09-08 DIAGNOSIS — L810 Postinflammatory hyperpigmentation: Secondary | ICD-10-CM | POA: Diagnosis not present

## 2020-09-08 DIAGNOSIS — K130 Diseases of lips: Secondary | ICD-10-CM | POA: Diagnosis not present

## 2020-09-28 DIAGNOSIS — K130 Diseases of lips: Secondary | ICD-10-CM | POA: Diagnosis not present

## 2020-09-28 DIAGNOSIS — L821 Other seborrheic keratosis: Secondary | ICD-10-CM | POA: Diagnosis not present

## 2020-10-11 DIAGNOSIS — U071 COVID-19: Secondary | ICD-10-CM | POA: Diagnosis not present

## 2020-10-11 DIAGNOSIS — Z20822 Contact with and (suspected) exposure to covid-19: Secondary | ICD-10-CM | POA: Diagnosis not present

## 2020-10-28 ENCOUNTER — Other Ambulatory Visit: Payer: Self-pay | Admitting: Acute Care

## 2020-10-28 DIAGNOSIS — Z87891 Personal history of nicotine dependence: Secondary | ICD-10-CM

## 2020-10-31 DIAGNOSIS — E119 Type 2 diabetes mellitus without complications: Secondary | ICD-10-CM | POA: Diagnosis not present

## 2020-10-31 DIAGNOSIS — Z789 Other specified health status: Secondary | ICD-10-CM | POA: Diagnosis not present

## 2020-10-31 DIAGNOSIS — I10 Essential (primary) hypertension: Secondary | ICD-10-CM | POA: Diagnosis not present

## 2020-10-31 DIAGNOSIS — E210 Primary hyperparathyroidism: Secondary | ICD-10-CM | POA: Diagnosis not present

## 2020-10-31 DIAGNOSIS — E7800 Pure hypercholesterolemia, unspecified: Secondary | ICD-10-CM | POA: Diagnosis not present

## 2020-11-23 ENCOUNTER — Other Ambulatory Visit: Payer: Self-pay | Admitting: Family Medicine

## 2020-11-23 ENCOUNTER — Ambulatory Visit
Admission: RE | Admit: 2020-11-23 | Discharge: 2020-11-23 | Disposition: A | Discharge status: Home / Self Care | Payer: HMO | Source: Ambulatory Visit | Attending: Acute Care | Admitting: Acute Care

## 2020-11-23 DIAGNOSIS — Z1231 Encounter for screening mammogram for malignant neoplasm of breast: Secondary | ICD-10-CM

## 2020-11-23 DIAGNOSIS — I2510 Atherosclerotic heart disease of native coronary artery without angina pectoris: Secondary | ICD-10-CM | POA: Diagnosis not present

## 2020-11-23 DIAGNOSIS — I7789 Other specified disorders of arteries and arterioles: Secondary | ICD-10-CM | POA: Diagnosis not present

## 2020-11-23 DIAGNOSIS — Z87891 Personal history of nicotine dependence: Secondary | ICD-10-CM

## 2020-11-23 DIAGNOSIS — J432 Centrilobular emphysema: Secondary | ICD-10-CM | POA: Diagnosis not present

## 2020-12-01 DIAGNOSIS — E210 Primary hyperparathyroidism: Secondary | ICD-10-CM | POA: Diagnosis not present

## 2020-12-01 DIAGNOSIS — E785 Hyperlipidemia, unspecified: Secondary | ICD-10-CM | POA: Diagnosis not present

## 2020-12-01 DIAGNOSIS — K130 Diseases of lips: Secondary | ICD-10-CM | POA: Diagnosis not present

## 2020-12-01 DIAGNOSIS — I10 Essential (primary) hypertension: Secondary | ICD-10-CM | POA: Diagnosis not present

## 2020-12-01 DIAGNOSIS — L821 Other seborrheic keratosis: Secondary | ICD-10-CM | POA: Diagnosis not present

## 2020-12-01 DIAGNOSIS — Z724 Inappropriate diet and eating habits: Secondary | ICD-10-CM | POA: Diagnosis not present

## 2020-12-01 DIAGNOSIS — E669 Obesity, unspecified: Secondary | ICD-10-CM | POA: Diagnosis not present

## 2020-12-01 DIAGNOSIS — Z794 Long term (current) use of insulin: Secondary | ICD-10-CM | POA: Diagnosis not present

## 2020-12-01 DIAGNOSIS — E1165 Type 2 diabetes mellitus with hyperglycemia: Secondary | ICD-10-CM | POA: Diagnosis not present

## 2020-12-04 ENCOUNTER — Other Ambulatory Visit: Payer: Self-pay | Admitting: Primary Care

## 2020-12-08 DIAGNOSIS — Z794 Long term (current) use of insulin: Secondary | ICD-10-CM | POA: Diagnosis not present

## 2020-12-08 DIAGNOSIS — I10 Essential (primary) hypertension: Secondary | ICD-10-CM | POA: Diagnosis not present

## 2020-12-08 DIAGNOSIS — Z724 Inappropriate diet and eating habits: Secondary | ICD-10-CM | POA: Diagnosis not present

## 2020-12-08 DIAGNOSIS — E210 Primary hyperparathyroidism: Secondary | ICD-10-CM | POA: Diagnosis not present

## 2020-12-08 DIAGNOSIS — E785 Hyperlipidemia, unspecified: Secondary | ICD-10-CM | POA: Diagnosis not present

## 2020-12-08 DIAGNOSIS — E1165 Type 2 diabetes mellitus with hyperglycemia: Secondary | ICD-10-CM | POA: Diagnosis not present

## 2020-12-08 DIAGNOSIS — E669 Obesity, unspecified: Secondary | ICD-10-CM | POA: Diagnosis not present

## 2020-12-08 NOTE — Progress Notes (Signed)
Please call patient and let them  know their  low dose Ct was read as a Lung RADS 2: nodules that are benign in appearance and behavior with a very low likelihood of becoming a clinically active cancer due to size or lack of growth. Recommendation per radiology is for a repeat LDCT in 12 months. .Please let them  know we will order and schedule their  annual screening scan for 11/2021. Please let them  know there was notation of CAD on their  scan.  Please remind the patient  that this is a non-gated exam therefore degree or severity of disease  cannot be determined. Please have them  follow up with their PCP regarding potential risk factor modification, dietary therapy or pharmacologic therapy if clinically indicated. Pt.  is not  currently on statin therapy. Please place order for annual  screening scan for  11/2021 and fax results to PCP. Thanks so much.  Langley Gauss, she needs to follow up with PCP. She has CAD and needs an echo to evaluate for Pulmonary HTN. ( Enlargement of the pulmonary outflow tract and main pulmonary Arteries)  Thanks

## 2020-12-12 ENCOUNTER — Encounter: Payer: Self-pay | Admitting: *Deleted

## 2020-12-12 DIAGNOSIS — Z87891 Personal history of nicotine dependence: Secondary | ICD-10-CM

## 2020-12-22 DIAGNOSIS — Z794 Long term (current) use of insulin: Secondary | ICD-10-CM | POA: Diagnosis not present

## 2020-12-22 DIAGNOSIS — E210 Primary hyperparathyroidism: Secondary | ICD-10-CM | POA: Diagnosis not present

## 2020-12-22 DIAGNOSIS — I10 Essential (primary) hypertension: Secondary | ICD-10-CM | POA: Diagnosis not present

## 2020-12-22 DIAGNOSIS — Z724 Inappropriate diet and eating habits: Secondary | ICD-10-CM | POA: Diagnosis not present

## 2020-12-22 DIAGNOSIS — E669 Obesity, unspecified: Secondary | ICD-10-CM | POA: Diagnosis not present

## 2020-12-22 DIAGNOSIS — E785 Hyperlipidemia, unspecified: Secondary | ICD-10-CM | POA: Diagnosis not present

## 2020-12-22 DIAGNOSIS — E1165 Type 2 diabetes mellitus with hyperglycemia: Secondary | ICD-10-CM | POA: Diagnosis not present

## 2021-01-12 ENCOUNTER — Ambulatory Visit
Admission: RE | Admit: 2021-01-12 | Discharge: 2021-01-12 | Disposition: A | Discharge status: Home / Self Care | Payer: HMO | Source: Ambulatory Visit | Attending: Family Medicine | Admitting: Family Medicine

## 2021-01-12 ENCOUNTER — Other Ambulatory Visit: Payer: Self-pay

## 2021-01-12 DIAGNOSIS — R609 Edema, unspecified: Secondary | ICD-10-CM | POA: Diagnosis not present

## 2021-01-12 DIAGNOSIS — Z1231 Encounter for screening mammogram for malignant neoplasm of breast: Secondary | ICD-10-CM | POA: Diagnosis not present

## 2021-01-12 DIAGNOSIS — Z724 Inappropriate diet and eating habits: Secondary | ICD-10-CM | POA: Diagnosis not present

## 2021-01-12 DIAGNOSIS — Z23 Encounter for immunization: Secondary | ICD-10-CM | POA: Diagnosis not present

## 2021-01-12 DIAGNOSIS — E210 Primary hyperparathyroidism: Secondary | ICD-10-CM | POA: Diagnosis not present

## 2021-01-12 DIAGNOSIS — E1165 Type 2 diabetes mellitus with hyperglycemia: Secondary | ICD-10-CM | POA: Diagnosis not present

## 2021-01-12 DIAGNOSIS — E1142 Type 2 diabetes mellitus with diabetic polyneuropathy: Secondary | ICD-10-CM | POA: Diagnosis not present

## 2021-01-12 DIAGNOSIS — B009 Herpesviral infection, unspecified: Secondary | ICD-10-CM | POA: Diagnosis not present

## 2021-01-12 DIAGNOSIS — I10 Essential (primary) hypertension: Secondary | ICD-10-CM | POA: Diagnosis not present

## 2021-01-12 DIAGNOSIS — E669 Obesity, unspecified: Secondary | ICD-10-CM | POA: Diagnosis not present

## 2021-01-12 DIAGNOSIS — Z794 Long term (current) use of insulin: Secondary | ICD-10-CM | POA: Diagnosis not present

## 2021-01-12 DIAGNOSIS — E785 Hyperlipidemia, unspecified: Secondary | ICD-10-CM | POA: Diagnosis not present

## 2021-01-18 ENCOUNTER — Other Ambulatory Visit: Payer: Self-pay | Admitting: Family Medicine

## 2021-01-18 DIAGNOSIS — R928 Other abnormal and inconclusive findings on diagnostic imaging of breast: Secondary | ICD-10-CM

## 2021-01-26 DIAGNOSIS — E1165 Type 2 diabetes mellitus with hyperglycemia: Secondary | ICD-10-CM | POA: Diagnosis not present

## 2021-01-26 DIAGNOSIS — E210 Primary hyperparathyroidism: Secondary | ICD-10-CM | POA: Diagnosis not present

## 2021-01-26 DIAGNOSIS — I10 Essential (primary) hypertension: Secondary | ICD-10-CM | POA: Diagnosis not present

## 2021-01-26 DIAGNOSIS — E785 Hyperlipidemia, unspecified: Secondary | ICD-10-CM | POA: Diagnosis not present

## 2021-01-26 DIAGNOSIS — Z724 Inappropriate diet and eating habits: Secondary | ICD-10-CM | POA: Diagnosis not present

## 2021-01-26 DIAGNOSIS — Z794 Long term (current) use of insulin: Secondary | ICD-10-CM | POA: Diagnosis not present

## 2021-01-26 DIAGNOSIS — E669 Obesity, unspecified: Secondary | ICD-10-CM | POA: Diagnosis not present

## 2021-02-01 ENCOUNTER — Ambulatory Visit
Admission: RE | Admit: 2021-02-01 | Discharge: 2021-02-01 | Disposition: A | Discharge status: Home / Self Care | Payer: HMO | Source: Ambulatory Visit | Attending: Family Medicine | Admitting: Family Medicine

## 2021-02-01 ENCOUNTER — Other Ambulatory Visit: Payer: Self-pay

## 2021-02-01 ENCOUNTER — Other Ambulatory Visit: Payer: Self-pay | Admitting: Family Medicine

## 2021-02-01 DIAGNOSIS — R928 Other abnormal and inconclusive findings on diagnostic imaging of breast: Secondary | ICD-10-CM

## 2021-02-01 DIAGNOSIS — R922 Inconclusive mammogram: Secondary | ICD-10-CM | POA: Diagnosis not present

## 2021-02-14 DIAGNOSIS — E1165 Type 2 diabetes mellitus with hyperglycemia: Secondary | ICD-10-CM | POA: Diagnosis not present

## 2021-02-14 DIAGNOSIS — E119 Type 2 diabetes mellitus without complications: Secondary | ICD-10-CM | POA: Diagnosis not present

## 2021-02-14 DIAGNOSIS — I10 Essential (primary) hypertension: Secondary | ICD-10-CM | POA: Diagnosis not present

## 2021-02-14 DIAGNOSIS — Z789 Other specified health status: Secondary | ICD-10-CM | POA: Diagnosis not present

## 2021-02-14 DIAGNOSIS — E7800 Pure hypercholesterolemia, unspecified: Secondary | ICD-10-CM | POA: Diagnosis not present

## 2021-02-14 DIAGNOSIS — E210 Primary hyperparathyroidism: Secondary | ICD-10-CM | POA: Diagnosis not present

## 2021-02-16 DIAGNOSIS — Z794 Long term (current) use of insulin: Secondary | ICD-10-CM | POA: Diagnosis not present

## 2021-02-16 DIAGNOSIS — E1165 Type 2 diabetes mellitus with hyperglycemia: Secondary | ICD-10-CM | POA: Diagnosis not present

## 2021-02-16 DIAGNOSIS — E210 Primary hyperparathyroidism: Secondary | ICD-10-CM | POA: Diagnosis not present

## 2021-02-16 DIAGNOSIS — Z724 Inappropriate diet and eating habits: Secondary | ICD-10-CM | POA: Diagnosis not present

## 2021-02-16 DIAGNOSIS — E785 Hyperlipidemia, unspecified: Secondary | ICD-10-CM | POA: Diagnosis not present

## 2021-02-16 DIAGNOSIS — E669 Obesity, unspecified: Secondary | ICD-10-CM | POA: Diagnosis not present

## 2021-02-16 DIAGNOSIS — I10 Essential (primary) hypertension: Secondary | ICD-10-CM | POA: Diagnosis not present

## 2021-03-02 DIAGNOSIS — E210 Primary hyperparathyroidism: Secondary | ICD-10-CM | POA: Diagnosis not present

## 2021-03-02 DIAGNOSIS — E1165 Type 2 diabetes mellitus with hyperglycemia: Secondary | ICD-10-CM | POA: Diagnosis not present

## 2021-03-02 DIAGNOSIS — E785 Hyperlipidemia, unspecified: Secondary | ICD-10-CM | POA: Diagnosis not present

## 2021-03-02 DIAGNOSIS — E669 Obesity, unspecified: Secondary | ICD-10-CM | POA: Diagnosis not present

## 2021-03-02 DIAGNOSIS — I10 Essential (primary) hypertension: Secondary | ICD-10-CM | POA: Diagnosis not present

## 2021-03-02 DIAGNOSIS — Z794 Long term (current) use of insulin: Secondary | ICD-10-CM | POA: Diagnosis not present

## 2021-03-02 DIAGNOSIS — Z724 Inappropriate diet and eating habits: Secondary | ICD-10-CM | POA: Diagnosis not present

## 2021-03-20 DIAGNOSIS — Z794 Long term (current) use of insulin: Secondary | ICD-10-CM | POA: Diagnosis not present

## 2021-03-20 DIAGNOSIS — E669 Obesity, unspecified: Secondary | ICD-10-CM | POA: Diagnosis not present

## 2021-03-20 DIAGNOSIS — E1165 Type 2 diabetes mellitus with hyperglycemia: Secondary | ICD-10-CM | POA: Diagnosis not present

## 2021-03-20 DIAGNOSIS — E785 Hyperlipidemia, unspecified: Secondary | ICD-10-CM | POA: Diagnosis not present

## 2021-03-20 DIAGNOSIS — I10 Essential (primary) hypertension: Secondary | ICD-10-CM | POA: Diagnosis not present

## 2021-03-20 DIAGNOSIS — Z724 Inappropriate diet and eating habits: Secondary | ICD-10-CM | POA: Diagnosis not present

## 2021-03-20 DIAGNOSIS — E210 Primary hyperparathyroidism: Secondary | ICD-10-CM | POA: Diagnosis not present

## 2021-03-24 DIAGNOSIS — E1169 Type 2 diabetes mellitus with other specified complication: Secondary | ICD-10-CM | POA: Diagnosis not present

## 2021-03-25 ENCOUNTER — Other Ambulatory Visit: Payer: Self-pay | Admitting: Primary Care

## 2021-03-27 DIAGNOSIS — E210 Primary hyperparathyroidism: Secondary | ICD-10-CM | POA: Diagnosis not present

## 2021-03-27 DIAGNOSIS — E785 Hyperlipidemia, unspecified: Secondary | ICD-10-CM | POA: Diagnosis not present

## 2021-03-27 DIAGNOSIS — E1165 Type 2 diabetes mellitus with hyperglycemia: Secondary | ICD-10-CM | POA: Diagnosis not present

## 2021-03-27 DIAGNOSIS — I10 Essential (primary) hypertension: Secondary | ICD-10-CM | POA: Diagnosis not present

## 2021-03-27 DIAGNOSIS — Z794 Long term (current) use of insulin: Secondary | ICD-10-CM | POA: Diagnosis not present

## 2021-03-27 DIAGNOSIS — Z724 Inappropriate diet and eating habits: Secondary | ICD-10-CM | POA: Diagnosis not present

## 2021-03-27 DIAGNOSIS — E669 Obesity, unspecified: Secondary | ICD-10-CM | POA: Diagnosis not present

## 2021-05-10 DIAGNOSIS — I270 Primary pulmonary hypertension: Secondary | ICD-10-CM | POA: Diagnosis not present

## 2021-05-10 DIAGNOSIS — E1169 Type 2 diabetes mellitus with other specified complication: Secondary | ICD-10-CM | POA: Diagnosis not present

## 2021-05-10 DIAGNOSIS — E7800 Pure hypercholesterolemia, unspecified: Secondary | ICD-10-CM | POA: Diagnosis not present

## 2021-05-10 DIAGNOSIS — Z0000 Encounter for general adult medical examination without abnormal findings: Secondary | ICD-10-CM | POA: Diagnosis not present

## 2021-05-10 DIAGNOSIS — G720 Drug-induced myopathy: Secondary | ICD-10-CM | POA: Diagnosis not present

## 2021-05-10 DIAGNOSIS — R911 Solitary pulmonary nodule: Secondary | ICD-10-CM | POA: Diagnosis not present

## 2021-05-10 DIAGNOSIS — Z7984 Long term (current) use of oral hypoglycemic drugs: Secondary | ICD-10-CM | POA: Diagnosis not present

## 2021-05-10 DIAGNOSIS — R413 Other amnesia: Secondary | ICD-10-CM | POA: Diagnosis not present

## 2021-05-10 DIAGNOSIS — I700 Atherosclerosis of aorta: Secondary | ICD-10-CM | POA: Diagnosis not present

## 2021-05-10 DIAGNOSIS — E559 Vitamin D deficiency, unspecified: Secondary | ICD-10-CM | POA: Diagnosis not present

## 2021-05-10 DIAGNOSIS — J439 Emphysema, unspecified: Secondary | ICD-10-CM | POA: Diagnosis not present

## 2021-05-10 DIAGNOSIS — E1142 Type 2 diabetes mellitus with diabetic polyneuropathy: Secondary | ICD-10-CM | POA: Diagnosis not present

## 2021-05-10 DIAGNOSIS — Z79899 Other long term (current) drug therapy: Secondary | ICD-10-CM | POA: Diagnosis not present

## 2021-05-10 DIAGNOSIS — I10 Essential (primary) hypertension: Secondary | ICD-10-CM | POA: Diagnosis not present

## 2021-06-14 DIAGNOSIS — E7800 Pure hypercholesterolemia, unspecified: Secondary | ICD-10-CM | POA: Diagnosis not present

## 2021-06-14 DIAGNOSIS — E1165 Type 2 diabetes mellitus with hyperglycemia: Secondary | ICD-10-CM | POA: Diagnosis not present

## 2021-06-14 DIAGNOSIS — E119 Type 2 diabetes mellitus without complications: Secondary | ICD-10-CM | POA: Diagnosis not present

## 2021-06-14 DIAGNOSIS — E210 Primary hyperparathyroidism: Secondary | ICD-10-CM | POA: Diagnosis not present

## 2021-06-21 DIAGNOSIS — Z789 Other specified health status: Secondary | ICD-10-CM | POA: Diagnosis not present

## 2021-06-21 DIAGNOSIS — E7800 Pure hypercholesterolemia, unspecified: Secondary | ICD-10-CM | POA: Diagnosis not present

## 2021-06-21 DIAGNOSIS — E210 Primary hyperparathyroidism: Secondary | ICD-10-CM | POA: Diagnosis not present

## 2021-06-21 DIAGNOSIS — E1165 Type 2 diabetes mellitus with hyperglycemia: Secondary | ICD-10-CM | POA: Diagnosis not present

## 2021-06-21 DIAGNOSIS — G5791 Unspecified mononeuropathy of right lower limb: Secondary | ICD-10-CM | POA: Diagnosis not present

## 2021-06-21 DIAGNOSIS — I10 Essential (primary) hypertension: Secondary | ICD-10-CM | POA: Diagnosis not present

## 2021-06-30 ENCOUNTER — Other Ambulatory Visit: Payer: Self-pay | Admitting: Pulmonary Disease

## 2021-07-05 DIAGNOSIS — E669 Obesity, unspecified: Secondary | ICD-10-CM | POA: Diagnosis not present

## 2021-07-05 DIAGNOSIS — I10 Essential (primary) hypertension: Secondary | ICD-10-CM | POA: Diagnosis not present

## 2021-07-05 DIAGNOSIS — Z794 Long term (current) use of insulin: Secondary | ICD-10-CM | POA: Diagnosis not present

## 2021-07-05 DIAGNOSIS — E1169 Type 2 diabetes mellitus with other specified complication: Secondary | ICD-10-CM | POA: Diagnosis not present

## 2021-07-05 DIAGNOSIS — Z713 Dietary counseling and surveillance: Secondary | ICD-10-CM | POA: Diagnosis not present

## 2021-07-18 DIAGNOSIS — Z713 Dietary counseling and surveillance: Secondary | ICD-10-CM | POA: Diagnosis not present

## 2021-07-18 DIAGNOSIS — E210 Primary hyperparathyroidism: Secondary | ICD-10-CM | POA: Diagnosis not present

## 2021-07-18 DIAGNOSIS — Z724 Inappropriate diet and eating habits: Secondary | ICD-10-CM | POA: Diagnosis not present

## 2021-07-18 DIAGNOSIS — E1165 Type 2 diabetes mellitus with hyperglycemia: Secondary | ICD-10-CM | POA: Diagnosis not present

## 2021-07-18 DIAGNOSIS — E669 Obesity, unspecified: Secondary | ICD-10-CM | POA: Diagnosis not present

## 2021-07-18 DIAGNOSIS — I10 Essential (primary) hypertension: Secondary | ICD-10-CM | POA: Diagnosis not present

## 2021-07-18 DIAGNOSIS — Z794 Long term (current) use of insulin: Secondary | ICD-10-CM | POA: Diagnosis not present

## 2021-07-18 DIAGNOSIS — E785 Hyperlipidemia, unspecified: Secondary | ICD-10-CM | POA: Diagnosis not present

## 2021-07-19 ENCOUNTER — Other Ambulatory Visit: Payer: Self-pay | Admitting: Pulmonary Disease

## 2021-08-01 DIAGNOSIS — I10 Essential (primary) hypertension: Secondary | ICD-10-CM | POA: Diagnosis not present

## 2021-08-01 DIAGNOSIS — E669 Obesity, unspecified: Secondary | ICD-10-CM | POA: Diagnosis not present

## 2021-08-01 DIAGNOSIS — E210 Primary hyperparathyroidism: Secondary | ICD-10-CM | POA: Diagnosis not present

## 2021-08-01 DIAGNOSIS — E1165 Type 2 diabetes mellitus with hyperglycemia: Secondary | ICD-10-CM | POA: Diagnosis not present

## 2021-08-01 DIAGNOSIS — E785 Hyperlipidemia, unspecified: Secondary | ICD-10-CM | POA: Diagnosis not present

## 2021-08-01 DIAGNOSIS — Z724 Inappropriate diet and eating habits: Secondary | ICD-10-CM | POA: Diagnosis not present

## 2021-08-03 ENCOUNTER — Other Ambulatory Visit: Payer: Self-pay | Admitting: Family Medicine

## 2021-08-03 ENCOUNTER — Ambulatory Visit
Admission: RE | Admit: 2021-08-03 | Discharge: 2021-08-03 | Disposition: A | Discharge status: Home / Self Care | Payer: HMO | Source: Ambulatory Visit | Attending: Family Medicine | Admitting: Family Medicine

## 2021-08-03 DIAGNOSIS — N6323 Unspecified lump in the left breast, lower outer quadrant: Secondary | ICD-10-CM | POA: Diagnosis not present

## 2021-08-03 DIAGNOSIS — R928 Other abnormal and inconclusive findings on diagnostic imaging of breast: Secondary | ICD-10-CM

## 2021-08-03 DIAGNOSIS — N6320 Unspecified lump in the left breast, unspecified quadrant: Secondary | ICD-10-CM

## 2021-08-17 DIAGNOSIS — L908 Other atrophic disorders of skin: Secondary | ICD-10-CM | POA: Diagnosis not present

## 2021-08-17 DIAGNOSIS — R202 Paresthesia of skin: Secondary | ICD-10-CM | POA: Diagnosis not present

## 2021-08-17 DIAGNOSIS — K130 Diseases of lips: Secondary | ICD-10-CM | POA: Diagnosis not present

## 2021-09-04 DIAGNOSIS — K7689 Other specified diseases of liver: Secondary | ICD-10-CM | POA: Diagnosis not present

## 2021-09-04 DIAGNOSIS — Z86010 Personal history of colonic polyps: Secondary | ICD-10-CM | POA: Diagnosis not present

## 2021-09-21 DIAGNOSIS — G5791 Unspecified mononeuropathy of right lower limb: Secondary | ICD-10-CM | POA: Diagnosis not present

## 2021-09-21 DIAGNOSIS — I10 Essential (primary) hypertension: Secondary | ICD-10-CM | POA: Diagnosis not present

## 2021-09-21 DIAGNOSIS — E1165 Type 2 diabetes mellitus with hyperglycemia: Secondary | ICD-10-CM | POA: Diagnosis not present

## 2021-09-21 DIAGNOSIS — E210 Primary hyperparathyroidism: Secondary | ICD-10-CM | POA: Diagnosis not present

## 2021-09-21 DIAGNOSIS — E7800 Pure hypercholesterolemia, unspecified: Secondary | ICD-10-CM | POA: Diagnosis not present

## 2021-09-21 DIAGNOSIS — Z789 Other specified health status: Secondary | ICD-10-CM | POA: Diagnosis not present

## 2021-09-28 ENCOUNTER — Other Ambulatory Visit: Payer: Self-pay | Admitting: Pulmonary Disease

## 2021-11-23 ENCOUNTER — Ambulatory Visit
Admission: RE | Admit: 2021-11-23 | Discharge: 2021-11-23 | Disposition: A | Discharge status: Home / Self Care | Payer: PPO | Source: Ambulatory Visit | Attending: Family Medicine | Admitting: Family Medicine

## 2021-11-23 DIAGNOSIS — J948 Other specified pleural conditions: Secondary | ICD-10-CM | POA: Diagnosis not present

## 2021-11-23 DIAGNOSIS — Z87891 Personal history of nicotine dependence: Secondary | ICD-10-CM

## 2021-11-23 DIAGNOSIS — I2510 Atherosclerotic heart disease of native coronary artery without angina pectoris: Secondary | ICD-10-CM | POA: Diagnosis not present

## 2021-11-23 DIAGNOSIS — J432 Centrilobular emphysema: Secondary | ICD-10-CM | POA: Diagnosis not present

## 2021-11-24 ENCOUNTER — Other Ambulatory Visit: Payer: Self-pay | Admitting: Acute Care

## 2021-11-24 DIAGNOSIS — Z122 Encounter for screening for malignant neoplasm of respiratory organs: Secondary | ICD-10-CM

## 2021-11-24 DIAGNOSIS — Z87891 Personal history of nicotine dependence: Secondary | ICD-10-CM

## 2021-12-21 DIAGNOSIS — M25511 Pain in right shoulder: Secondary | ICD-10-CM | POA: Diagnosis not present

## 2021-12-21 DIAGNOSIS — R202 Paresthesia of skin: Secondary | ICD-10-CM | POA: Diagnosis not present

## 2022-01-11 DIAGNOSIS — M25511 Pain in right shoulder: Secondary | ICD-10-CM | POA: Diagnosis not present

## 2022-01-21 HISTORY — PX: BREAST BIOPSY: SHX20

## 2022-02-08 ENCOUNTER — Ambulatory Visit
Admission: RE | Admit: 2022-02-08 | Discharge: 2022-02-08 | Disposition: A | Discharge status: Home / Self Care | Payer: HMO | Source: Ambulatory Visit | Attending: Family Medicine | Admitting: Family Medicine

## 2022-02-08 ENCOUNTER — Other Ambulatory Visit: Payer: Self-pay | Admitting: Family Medicine

## 2022-02-08 DIAGNOSIS — N6320 Unspecified lump in the left breast, unspecified quadrant: Secondary | ICD-10-CM

## 2022-02-16 ENCOUNTER — Ambulatory Visit
Admission: RE | Admit: 2022-02-16 | Discharge: 2022-02-16 | Disposition: A | Discharge status: Home / Self Care | Payer: HMO | Source: Ambulatory Visit | Attending: Family Medicine | Admitting: Family Medicine

## 2022-02-16 ENCOUNTER — Other Ambulatory Visit (HOSPITAL_COMMUNITY): Payer: Self-pay | Admitting: Diagnostic Radiology

## 2022-02-16 DIAGNOSIS — N6320 Unspecified lump in the left breast, unspecified quadrant: Secondary | ICD-10-CM

## 2022-03-01 ENCOUNTER — Telehealth: Payer: Self-pay

## 2022-03-01 NOTE — Telephone Encounter (Signed)
Can we call patient again to see if she can come in for surgical clearance. She has seen sarah in the past. Donnald Garre called her already today.  Thank you

## 2022-03-02 ENCOUNTER — Telehealth: Payer: Self-pay | Admitting: Primary Care

## 2022-03-02 NOTE — Telephone Encounter (Signed)
Fax received from Dr. Esmond Plants with [facility] to perform a Right Shoulder Scope, A-SAD mini open RCR and DCR on patient.  Patient needs surgery clearance. Surgery is PENDING. Patient was seen on 11/02/2019. Office protocol is a risk assessment can be sent to surgeon if patient has been seen in 60 days or less.   Sending to Derl Barrow NP for risk assessment or recommendations if patient needs to be seen in office prior to surgical procedure.    Patient has office visit scheduled with Derl Barrow NP on 03/09/2022 for surgical clearance

## 2022-03-09 ENCOUNTER — Encounter: Payer: Self-pay | Admitting: Primary Care

## 2022-03-09 ENCOUNTER — Ambulatory Visit: Payer: HMO | Admitting: Primary Care

## 2022-03-09 VITALS — BP 122/64 | HR 91 | Ht 66.0 in | Wt 243.6 lb

## 2022-03-09 DIAGNOSIS — J438 Other emphysema: Secondary | ICD-10-CM | POA: Diagnosis not present

## 2022-03-09 DIAGNOSIS — Z01811 Encounter for preprocedural respiratory examination: Secondary | ICD-10-CM

## 2022-03-09 NOTE — Patient Instructions (Addendum)
You are considered low risk for prolonged mechanical ventilation and or postop pulmonary complications   Recommendations Take Combivent 1 puff every 6 hours as needed for shortness of breath/bronchitis  Take Singulair as needed for allergy symptoms  Use incentive spirometer 5-10 deep breaths every hour after surgery and encourage early/frequent ambulation  Orders: Spirometry- 30 mins (first open in November 30th at 10:30am)  Follow-up: 1 year with [facility] NP or sooner if needed

## 2022-03-09 NOTE — Telephone Encounter (Signed)
OV notes and clearance form have been faxed back to [facility]. Nothing further needed at this time. ?

## 2022-03-09 NOTE — Assessment & Plan Note (Addendum)
-   Patient is considered a low risk for prolonged mechanical ventilation and/or postop pulmonary complications.  COPD/emphysema is currently stable.  No recent respiratory infections or hospitalizations.  She is not on maintenance BD regiment. She uses SAMA as needed only.  She is not on oxygen.  Her exam today was benign. Ultimate clearance decided upon by surgeon/anesthesiology.

## 2022-03-09 NOTE — Assessment & Plan Note (Signed)
-   Stable; Pulmonary function testing in July 2019 showed no overt obstruction, reduction mid-flows suggestive of small airway disease. CT imaging with mild emphysematous changes. Breathing is stable; she does not have daily symptoms. She is managed with prn SAMA and leukotriene inhibitor. Needs updated breathing test before surgery.

## 2022-03-09 NOTE — Progress Notes (Signed)
_0  ID: Stephanie Fox, female    DOB: February 23, 1951, 71 y.o.   MRN: [id number]  Chief Complaint  Patient presents with   Follow-up    Pt is here to get cleared for surgery that she is having. Pt is having surgery on her right shoulder    Referring provider: Jonathon Jordan, MD  HPI: 71 year old female, former smoker. PMH significant for HTN, hepatic cyst, type 2 diabetes, COPD/emphysema, solitary lung nodule. Patient of Dr. Vaughan Browner. PFTs showed no overt obstruction but there is a curvature to flow loop and reduction in mid-flow suggestive of small airway disease. Not on any maintenance inhalers.   CT chest 07/2017 showed 11m RUL nodule and mild emphysematous changes. CT chest 10/07/18- 4 mm RIGHT upper lobe pulmonary nodule unchanged from comparison exam and considered benign. No new pulmonary nodules  03/09/2022- INTERIM HX  Patient presents today for surgical clearance/ OV follow-up.  Patient follows with our office for history of COPD and emphysema.  She was last seen for video visit in April 2021. She quit smoking March 2021. She is doing well without acute complaints. Breathing is fine. She has no coughing symptoms. She has not needed to use rescue inhaler in the last year. She takes Singulair as needed.   Pulmonary function testing in July 2019 showed no obstructive lung disease however she had curvature on flow-volume loop and reduction in mid flows suggestive of small airway disease.  She needs updated breathing tests.    No Known Allergies  Immunization History  Administered Date(s) Administered   Fluad Quad(high Dose 65+) 01/01/2019   Influenza Inj Mdck Quad Pf 02/17/2017   Influenza, High Dose Seasonal PF 01/14/2018, 01/06/2019   Influenza, Seasonal, Injecte, Preservative Fre 02/04/2017   PFIZER(Purple Top)SARS-COV-2 Vaccination 07/01/2019, 07/23/2019   Pneumococcal Conjugate-13 08/07/2017   Pneumococcal Polysaccharide-23 02/28/2014    Past Medical History:  Diagnosis  Date   Anal or rectal pain    per pt / had 3 times  in last 3 years   Anemia    as a child    Arthritis    COPD [place]ch[place] disease[place]    early   DDD (degenerative disc disease), lumbosacral    l4-5, S1   Diabetes [place]    type II    Dyspnea    with exertion    Edema    lower extremities    GERD (gastroesophageal reflux disease)    in past   History of kidney stones    Hyperlipidemia    Hypertension    Low back pain    Spinal stenosis     Tobacco History: Social History   Tobacco Use  Smoking Status Former   Packs/day: 0.70   Years: 49.00   Total pack years: 34.30   Types: Cigarettes   Quit date: 06/22/2019   Years since quitting: 2.7  Smokeless Tobacco Never   Counseling given: Not Answered   Outpatient Medications Prior to Visit  Medication Sig Dispense Refill   ASPIRIN 81 PO aspirin 81 mg tablet,delayed release  Take 1 tablet every day by oral route.     benzonatate (TESSALON) 100 MG capsule Take 100 mg by mouth 2 (two) times daily as needed for cough.      Cholecalciferol (VITAMIN D3) 2000 UNITS TABS Take 2,000 Units by mouth daily.      ezetimibe (ZETIA) 10 MG tablet Take 10 mg by mouth daily.  6   fluticasone (FLONASE) 50 MCG/ACT nasal spray Place  into both nostrils daily.     furosemide (LASIX) 40 MG tablet Take 40 mg by mouth daily as needed for fluid.      glimepiride (AMARYL) 1 MG tablet Take 1 mg by mouth as needed.     Ipratropium-Albuterol (COMBIVENT) 20-100 MCG/ACT AERS respimat Inhale 1 puff into the lungs every 6 (six) hours. 4 g 1   montelukast (SINGULAIR) 10 MG tablet TAKE 1 TABLET BY MOUTH EVERY NIGHT AT BEDTIME 90 tablet 0   Multiple Vitamin (MULTIVITAMIN) tablet Take 1 tablet by mouth daily. Reported on 07/21/2015     valsartan-hydrochlorothiazide (DIOVAN-HCT) 80-12.5 MG per tablet Take 1 tablet by mouth daily.     vitamin E 400 UNIT capsule Take 400 Units by mouth 2 (two) times daily.      Apple Cider Vinegar  500 MG TABS Take by mouth.     buPROPion (WELLBUTRIN XL) 150 MG 24 hr tablet Take 150 mg by mouth daily.     Dulaglutide (TRULICITY) 1.5 SA/6.3KZ SOPN Inject 1.5 mg into the skin.     metFORMIN (GLUCOPHAGE) 500 MG tablet Take 500 mg by mouth 2 (two) times daily with a meal.     oxyCODONE-acetaminophen (PERCOCET/ROXICET) 5-325 MG tablet Take 1-2 tablets by mouth every 6 (six) hours as needed. 12 tablet 0   No facility-administered medications prior to visit.    Review of Systems  Review of Systems  Constitutional: Negative.   HENT: Negative.    Respiratory: Negative.    Cardiovascular: Negative.    Physical Exam  BP 122/64 (BP Location: Left Arm, Patient Position: Sitting, Cuff Size: Normal)   Pulse 91   Ht _0  (1.676 m)   Wt 243 lb 9.6 oz (110.5 kg)   SpO2 96%   BMI 39.32 kg/m  Physical Exam Constitutional:      General: She is not in acute distress.    Appearance: Normal appearance. She is not ill-appearing.  HENT:     Head: Normocephalic and atraumatic.  Cardiovascular:     Rate and Rhythm: Normal rate and regular rhythm.  Pulmonary:     Effort: Pulmonary effort is normal.     Breath sounds: Normal breath sounds. No wheezing, rhonchi or rales.  Skin:    General: Skin is warm and dry.  Neurological:     General: No focal deficit present.     Mental Status: She is alert and oriented to person, place, and time. Mental status is at baseline.  Psychiatric:        Mood and Affect: Mood normal.        Behavior: Behavior normal.        Thought Content: Thought content normal.        Judgment: Judgment normal.      Lab Results:  CBC    Component Value Date/Time   WBC 10.5 12/06/2017 0423   RBC 3.98 12/06/2017 0423   HGB 12.0 12/06/2017 0423   HCT 36.7 12/06/2017 0423   PLT 287 12/06/2017 0423   MCV 92.2 12/06/2017 0423   MCH 30.2 12/06/2017 0423   MCHC 32.7 12/06/2017 0423   RDW 14.0 12/06/2017 0423   LYMPHSABS 2.2 12/06/2017 0423   MONOABS 1.0 12/06/2017  0423   EOSABS 0.1 12/06/2017 0423   BASOSABS 0.1 12/06/2017 0423    BMET    Component Value Date/Time   NA 137 10/19/2019 1050   K 4.1 10/19/2019 1050   CL 104 10/19/2019 1050   CO2 23 10/19/2019 1050   GLUCOSE  144 (H) 10/19/2019 1050   BUN 16 10/19/2019 1050   CREATININE 1.08 (H) 10/19/2019 1050   CALCIUM 9.8 10/19/2019 1050   GFRNONAA 53 (L) 10/19/2019 1050   GFRAA >60 10/19/2019 1050    BNP No results found for: "BNP"  ProBNP No results found for: "PROBNP"  Imaging: [place] LT BREAST BX W LOC DEV 1ST LESION IMG BX SPEC US GUIDE  Addendum Date: 02/21/2022   ADDENDUM REPORT: 02/21/2022 07:51 ADDENDUM: 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 This was found to be concordant by Dr. Hassan Rowan. Pathology results were discussed with the patient by telephone by Stacie Acres, RN Nurse Navigator. The patient reported doing well after the biopsy with tenderness at the site. Post biopsy instructions and care were reviewed and questions were answered. The patient was encouraged to call [facility] for any additional concerns. The patient was instructed to return for annual screening mammography in October 2024. Pathology results reported by Terie Purser, RN on 02/20/2022. Electronically Signed   By: Margarette Canada M.D.   On: 02/21/2022 07:51   Result Date: 02/21/2022 CLINICAL DATA:  71 year old female for tissue sampling of 0.7 cm LOWER OUTER LEFT breast mass. EXAM: ULTRASOUND GUIDED LEFT BREAST CORE NEEDLE BIOPSY COMPARISON:  Previous exam(s). PROCEDURE: I met with the patient and we discussed the procedure of ultrasound-guided biopsy, including benefits and alternatives. We discussed the high likelihood of a successful procedure. We discussed the risks of the procedure, including infection, bleeding, tissue injury, clip migration, and inadequate sampling. Informed written consent was given.  The usual time-out protocol was performed immediately prior to the procedure. Lesion quadrant: LOWER OUTER LEFT breast Using sterile technique and 1% Lidocaine as local anesthetic, under direct ultrasound visualization, a 12 gauge spring-loaded device was used to perform biopsy of the 0.7 cm mass at the 5 o'clock position of the LEFT breast 8 cm from the nipple using a MEDIAL approach. At the conclusion of the procedure a RIBBON shaped tissue marker clip was deployed into the biopsy cavity. Follow up 2 view mammogram was performed and dictated separately. IMPRESSION: Ultrasound guided biopsy of 0.7 cm LOWER OUTER LEFT breast mass. No apparent complications. Electronically Signed: By: Margarette Canada M.D. On: 02/16/2022 13:51  MM CLIP PLACEMENT LEFT  Result Date: 02/16/2022 CLINICAL DATA:  Evaluate RIBBON clip placement following ultrasound-guided LEFT breast biopsy. EXAM: 3D DIAGNOSTIC LEFT MAMMOGRAM POST ULTRASOUND BIOPSY COMPARISON:  Previous exam(s). FINDINGS: 3D Mammographic images were obtained following ultrasound guided biopsy of the 0.7 cm mass at the 5 o'clock position of the LEFT breast 8 cm from the nipple. The RIBBON biopsy marking clip is in expected position at the site of biopsy. IMPRESSION: Appropriate positioning of the RIBBON shaped biopsy marking clip at the site of biopsy in the LOWER OUTER LEFT breast. Final Assessment: Post Procedure Mammograms for Marker Placement Electronically Signed   By: Margarette Canada M.D.   On: 02/16/2022 14:22  MM DIAG BREAST TOMO BILATERAL  Result Date: 02/08/2022 CLINICAL DATA:  Patient for follow-up of probably benign left breast mass. EXAM: DIGITAL DIAGNOSTIC BILATERAL MAMMOGRAM WITH TOMOSYNTHESIS; ULTRASOUND LEFT BREAST LIMITED TECHNIQUE: Bilateral digital diagnostic mammography and breast tomosynthesis was performed.; Targeted ultrasound examination of the left breast was performed. COMPARISON:  Previous exam(s). ACR Breast Density Category b: There are  scattered areas of fibroglandular density. FINDINGS: Slight interval increase in size of lobular mass within the posterior left breast. No additional masses,  calcifications or distortion identified within either breast. Targeted ultrasound is performed, showing a 7 x 3 x 3 mm lobular hypoechoic mass left breast 5 o'clock position 8 cm from the nipple. There appears to be increased internal echogenicity within this mass. No left axillary adenopathy. IMPRESSION: Indeterminate left breast mass 5 o'clock position. RECOMMENDATION: Ultrasound-guided core needle biopsy indeterminate left breast mass 5 o'clock position. Recommend attention to post clip film mammograms to ensure that this mass corresponds with the mammographically identified mass. I have discussed the findings and recommendations with the patient. If applicable, a reminder letter will be sent to the patient regarding the next appointment. BI-RADS CATEGORY  4: Suspicious. Electronically Signed   By: Lovey Newcomer M.D.   On: 02/08/2022 12:11  US BREAST LTD UNI LEFT INC AXILLA  Result Date: 02/08/2022 CLINICAL DATA:  Patient for follow-up of probably benign left breast mass. EXAM: DIGITAL DIAGNOSTIC BILATERAL MAMMOGRAM WITH TOMOSYNTHESIS; ULTRASOUND LEFT BREAST LIMITED TECHNIQUE: Bilateral digital diagnostic mammography and breast tomosynthesis was performed.; Targeted ultrasound examination of the left breast was performed. COMPARISON:  Previous exam(s). ACR Breast Density Category b: There are scattered areas of fibroglandular density. FINDINGS: Slight interval increase in size of lobular mass within the posterior left breast. No additional masses, calcifications or distortion identified within either breast. Targeted ultrasound is performed, showing a 7 x 3 x 3 mm lobular hypoechoic mass left breast 5 o'clock position 8 cm from the nipple. There appears to be increased internal echogenicity within this mass. No left axillary adenopathy. IMPRESSION:  Indeterminate left breast mass 5 o'clock position. RECOMMENDATION: Ultrasound-guided core needle biopsy indeterminate left breast mass 5 o'clock position. Recommend attention to post clip film mammograms to ensure that this mass corresponds with the mammographically identified mass. I have discussed the findings and recommendations with the patient. If applicable, a reminder letter will be sent to the patient regarding the next appointment. BI-RADS CATEGORY  4: Suspicious. Electronically Signed   By: Lovey Newcomer M.D.   On: 02/08/2022 12:11    Assessment & Plan:   Other emphysema ([place]) - Stable; Pulmonary function testing in July 2019 showed no overt obstruction, reduction mid-flows suggestive of small airway disease. CT imaging with mild emphysematous changes. Breathing is stable; she does not have daily symptoms. She is managed with prn SAMA and leukotriene inhibitor. Needs updated breathing test before surgery.   Encounter for pre-operative respiratory clearance - Patient is considered a low risk for prolonged mechanical ventilation and/or postop pulmonary complications.  COPD/emphysema is currently stable.  No recent respiratory infections or hospitalizations.  She is not on maintenance BD regiment. She uses SAMA as needed only.  She is not on oxygen.  Her exam today was benign. Ultimate clearance decided upon by surgeon/anesthesiology.    Martyn Ehrich, NP 03/09/2022

## 2022-03-19 ENCOUNTER — Other Ambulatory Visit: Payer: Self-pay | Admitting: Pulmonary Disease

## 2022-03-21 ENCOUNTER — Other Ambulatory Visit: Payer: Self-pay | Admitting: *Deleted

## 2022-03-21 DIAGNOSIS — J438 Other emphysema: Secondary | ICD-10-CM

## 2022-03-22 ENCOUNTER — Ambulatory Visit (INDEPENDENT_AMBULATORY_CARE_PROVIDER_SITE_OTHER): Payer: HMO | Admitting: Pulmonary Disease

## 2022-03-22 DIAGNOSIS — J438 Other emphysema: Secondary | ICD-10-CM

## 2022-03-22 LAB — PULMONARY FUNCTION TEST
FEF 25-75 Pre: 0.88 L/sec
FEF2575-%Pred-Pre: 42 %
FEV1-%Pred-Pre: 58 %
FEV1-Pre: 1.46 L
FEV1FVC-%Pred-Pre: 94 %
FEV6-%Pred-Pre: 64 %
FEV6-Pre: 2.02 L
FEV6FVC-%Pred-Pre: 103 %
FVC-%Pred-Pre: 62 %
FVC-Pre: 2.04 L
Pre FEV1/FVC ratio: 72 %
Pre FEV6/FVC Ratio: 99 %

## 2022-03-22 NOTE — Progress Notes (Signed)
Spirometry

## 2022-03-22 NOTE — Patient Instructions (Signed)
Spirometry

## 2022-03-27 ENCOUNTER — Other Ambulatory Visit (HOSPITAL_COMMUNITY): Payer: Self-pay | Admitting: Endocrinology

## 2022-03-27 DIAGNOSIS — E210 Primary hyperparathyroidism: Secondary | ICD-10-CM

## 2022-04-02 NOTE — Progress Notes (Signed)
Please let patient know spirometry was consistent with restrictive lung disease, FEV 1.46L (58% predicted). Likely from obesity.  Okay from pulmonary standpoint to have surgery.  No evidence of obstructive lung disease.

## 2022-04-04 ENCOUNTER — Telehealth: Payer: Self-pay | Admitting: Primary Care

## 2022-04-04 NOTE — Telephone Encounter (Signed)
OV notes and clearance form have been faxed back to [facility]. Nothing further needed at this time. ?

## 2022-04-04 NOTE — Telephone Encounter (Signed)
Progress Notes by Martyn Ehrich, NP at 03/22/2022 10:30 AM  Author: Martyn Ehrich, NP Author Type: Nurse Practitioner Filed: 04/02/2022  3:52 PM  Note Status: Signed Cosign: Cosign Not Required Encounter Date: 03/22/2022  Editor: Martyn Ehrich, NP (Nurse Practitioner)             Please let patient know spirometry was consistent with restrictive lung disease, FEV 1.46L (58% predicted). Likely from obesity.  Okay from pulmonary standpoint to have surgery.  No evidence of obstructive lung disease.       Routing this info to surgical clearance pool due to pt being okay to have surgery per pulmonary standpoint.

## 2022-04-13 ENCOUNTER — Encounter (HOSPITAL_COMMUNITY)
Admission: RE | Admit: 2022-04-13 | Discharge: 2022-04-13 | Disposition: A | Discharge status: Home / Self Care | Payer: PPO | Source: Ambulatory Visit | Attending: Endocrinology | Admitting: Endocrinology

## 2022-04-13 DIAGNOSIS — E210 Primary hyperparathyroidism: Secondary | ICD-10-CM | POA: Insufficient documentation

## 2022-04-13 MED ORDER — TECHNETIUM TC 99M SESTAMIBI - CARDIOLITE
25.3000 | Freq: Once | INTRAVENOUS | Status: AC | PRN
  Administered 2022-04-13: 25.3 via INTRAVENOUS

## 2022-04-30 ENCOUNTER — Other Ambulatory Visit: Payer: Self-pay | Admitting: Surgery

## 2022-05-01 ENCOUNTER — Other Ambulatory Visit: Payer: Self-pay | Admitting: Surgery

## 2022-05-17 ENCOUNTER — Ambulatory Visit
Admission: RE | Admit: 2022-05-17 | Discharge: 2022-05-17 | Disposition: A | Discharge status: Home / Self Care | Payer: PPO | Source: Ambulatory Visit | Attending: Surgery | Admitting: Surgery

## 2022-05-18 NOTE — Progress Notes (Signed)
USN is negative for parathyroid adenoma.  Await 4D CT scan results - scheduled for 05/25/2022  Armandina Gemma, MD [facility] [place] practice Office: [phone]

## 2022-05-22 DIAGNOSIS — I10 Essential (primary) hypertension: Secondary | ICD-10-CM | POA: Diagnosis not present

## 2022-05-22 DIAGNOSIS — Z79899 Other long term (current) drug therapy: Secondary | ICD-10-CM | POA: Diagnosis not present

## 2022-05-22 DIAGNOSIS — I700 Atherosclerosis of aorta: Secondary | ICD-10-CM | POA: Diagnosis not present

## 2022-05-22 DIAGNOSIS — Z0000 Encounter for general adult medical examination without abnormal findings: Secondary | ICD-10-CM | POA: Diagnosis not present

## 2022-05-22 DIAGNOSIS — Z01818 Encounter for other preprocedural examination: Secondary | ICD-10-CM | POA: Diagnosis not present

## 2022-05-22 DIAGNOSIS — E1122 Type 2 diabetes mellitus with diabetic chronic kidney disease: Secondary | ICD-10-CM | POA: Diagnosis not present

## 2022-05-22 DIAGNOSIS — E1169 Type 2 diabetes mellitus with other specified complication: Secondary | ICD-10-CM | POA: Diagnosis not present

## 2022-05-22 DIAGNOSIS — I270 Primary pulmonary hypertension: Secondary | ICD-10-CM | POA: Diagnosis not present

## 2022-05-22 DIAGNOSIS — E559 Vitamin D deficiency, unspecified: Secondary | ICD-10-CM | POA: Diagnosis not present

## 2022-05-22 DIAGNOSIS — E7800 Pure hypercholesterolemia, unspecified: Secondary | ICD-10-CM | POA: Diagnosis not present

## 2022-05-23 DIAGNOSIS — E1169 Type 2 diabetes mellitus with other specified complication: Secondary | ICD-10-CM | POA: Diagnosis not present

## 2022-05-23 DIAGNOSIS — J439 Emphysema, unspecified: Secondary | ICD-10-CM | POA: Diagnosis not present

## 2022-05-23 DIAGNOSIS — K219 Gastro-esophageal reflux disease without esophagitis: Secondary | ICD-10-CM | POA: Diagnosis not present

## 2022-05-23 DIAGNOSIS — E1142 Type 2 diabetes mellitus with diabetic polyneuropathy: Secondary | ICD-10-CM | POA: Diagnosis not present

## 2022-05-23 DIAGNOSIS — I10 Essential (primary) hypertension: Secondary | ICD-10-CM | POA: Diagnosis not present

## 2022-05-23 DIAGNOSIS — I2510 Atherosclerotic heart disease of native coronary artery without angina pectoris: Secondary | ICD-10-CM | POA: Diagnosis not present

## 2022-05-23 DIAGNOSIS — E7800 Pure hypercholesterolemia, unspecified: Secondary | ICD-10-CM | POA: Diagnosis not present

## 2022-05-24 DIAGNOSIS — L309 Dermatitis, unspecified: Secondary | ICD-10-CM | POA: Diagnosis not present

## 2022-05-24 DIAGNOSIS — L720 Epidermal cyst: Secondary | ICD-10-CM | POA: Diagnosis not present

## 2022-05-25 ENCOUNTER — Ambulatory Visit
Admission: RE | Admit: 2022-05-25 | Discharge: 2022-05-25 | Disposition: A | Discharge status: Home / Self Care | Payer: PPO | Source: Ambulatory Visit | Attending: Surgery | Admitting: Surgery

## 2022-05-25 MED ORDER — IOPAMIDOL (ISOVUE-300) INJECTION 61%
75.0000 mL | Freq: Once | INTRAVENOUS | Status: AC | PRN
  Administered 2022-05-25: 75 mL via INTRAVENOUS

## 2022-05-28 NOTE — Progress Notes (Signed)
Imaging studies are all negative for evidence of a parathyroid adenoma - USN, sestamibi, and 4D CT scan.  Patient does have hypercalcemia, but PTH level has usually been appropriately suppressed.  At this time I do not plan neck exploration for parathyroid adenoma.  Patient should undergo further evaluation by her primary care physician and her endocrinologist for other potential causes of hypercalcemia.  Will plan to see back for surgical care as needed.  [place], MD [facility] [place] practice Office: [phone]

## 2022-06-07 DIAGNOSIS — E7800 Pure hypercholesterolemia, unspecified: Secondary | ICD-10-CM | POA: Diagnosis not present

## 2022-06-07 DIAGNOSIS — I10 Essential (primary) hypertension: Secondary | ICD-10-CM | POA: Diagnosis not present

## 2022-06-07 DIAGNOSIS — I2510 Atherosclerotic heart disease of native coronary artery without angina pectoris: Secondary | ICD-10-CM | POA: Diagnosis not present

## 2022-06-07 DIAGNOSIS — K219 Gastro-esophageal reflux disease without esophagitis: Secondary | ICD-10-CM | POA: Diagnosis not present

## 2022-06-07 DIAGNOSIS — J439 Emphysema, unspecified: Secondary | ICD-10-CM | POA: Diagnosis not present

## 2022-06-07 DIAGNOSIS — E1142 Type 2 diabetes mellitus with diabetic polyneuropathy: Secondary | ICD-10-CM | POA: Diagnosis not present

## 2022-06-07 DIAGNOSIS — E1169 Type 2 diabetes mellitus with other specified complication: Secondary | ICD-10-CM | POA: Diagnosis not present

## 2022-06-13 DIAGNOSIS — D2372 Other benign neoplasm of skin of left lower limb, including hip: Secondary | ICD-10-CM | POA: Diagnosis not present

## 2022-06-13 DIAGNOSIS — L298 Other pruritus: Secondary | ICD-10-CM | POA: Diagnosis not present

## 2022-06-13 DIAGNOSIS — L2089 Other atopic dermatitis: Secondary | ICD-10-CM | POA: Diagnosis not present

## 2022-06-13 DIAGNOSIS — S80921A Unspecified superficial injury of right lower leg, initial encounter: Secondary | ICD-10-CM | POA: Diagnosis not present

## 2022-06-22 ENCOUNTER — Other Ambulatory Visit: Payer: Self-pay | Admitting: Pulmonary Disease

## 2022-07-12 DIAGNOSIS — I10 Essential (primary) hypertension: Secondary | ICD-10-CM | POA: Diagnosis not present

## 2022-07-12 DIAGNOSIS — E7800 Pure hypercholesterolemia, unspecified: Secondary | ICD-10-CM | POA: Diagnosis not present

## 2022-07-12 DIAGNOSIS — E1169 Type 2 diabetes mellitus with other specified complication: Secondary | ICD-10-CM | POA: Diagnosis not present

## 2022-07-12 DIAGNOSIS — J439 Emphysema, unspecified: Secondary | ICD-10-CM | POA: Diagnosis not present

## 2022-07-12 DIAGNOSIS — I2510 Atherosclerotic heart disease of native coronary artery without angina pectoris: Secondary | ICD-10-CM | POA: Diagnosis not present

## 2022-07-12 DIAGNOSIS — K219 Gastro-esophageal reflux disease without esophagitis: Secondary | ICD-10-CM | POA: Diagnosis not present

## 2022-07-27 DIAGNOSIS — Z6841 Body Mass Index (BMI) 40.0 and over, adult: Secondary | ICD-10-CM | POA: Diagnosis not present

## 2022-07-27 DIAGNOSIS — E1142 Type 2 diabetes mellitus with diabetic polyneuropathy: Secondary | ICD-10-CM | POA: Diagnosis not present

## 2022-07-27 DIAGNOSIS — E1165 Type 2 diabetes mellitus with hyperglycemia: Secondary | ICD-10-CM | POA: Diagnosis not present

## 2022-07-27 DIAGNOSIS — I10 Essential (primary) hypertension: Secondary | ICD-10-CM | POA: Diagnosis not present

## 2022-07-27 DIAGNOSIS — R809 Proteinuria, unspecified: Secondary | ICD-10-CM | POA: Diagnosis not present

## 2022-07-31 DIAGNOSIS — H40003 Preglaucoma, unspecified, bilateral: Secondary | ICD-10-CM | POA: Diagnosis not present

## 2022-07-31 DIAGNOSIS — H2513 Age-related nuclear cataract, bilateral: Secondary | ICD-10-CM | POA: Diagnosis not present

## 2022-08-22 ENCOUNTER — Encounter: Payer: Self-pay | Admitting: Gastroenterology

## 2022-08-27 DIAGNOSIS — Z86010 Personal history of colonic polyps: Secondary | ICD-10-CM | POA: Diagnosis not present

## 2022-08-27 DIAGNOSIS — Z1211 Encounter for screening for malignant neoplasm of colon: Secondary | ICD-10-CM | POA: Diagnosis not present

## 2022-09-09 DIAGNOSIS — R8290 Unspecified abnormal findings in urine: Secondary | ICD-10-CM | POA: Diagnosis not present

## 2022-09-09 DIAGNOSIS — N390 Urinary tract infection, site not specified: Secondary | ICD-10-CM | POA: Diagnosis not present

## 2022-09-09 DIAGNOSIS — R319 Hematuria, unspecified: Secondary | ICD-10-CM | POA: Diagnosis not present

## 2022-09-09 DIAGNOSIS — R109 Unspecified abdominal pain: Secondary | ICD-10-CM | POA: Diagnosis not present

## 2022-10-08 ENCOUNTER — Other Ambulatory Visit: Payer: Self-pay | Admitting: Acute Care

## 2022-10-08 DIAGNOSIS — Z87891 Personal history of nicotine dependence: Secondary | ICD-10-CM

## 2022-10-08 DIAGNOSIS — Z122 Encounter for screening for malignant neoplasm of respiratory organs: Secondary | ICD-10-CM

## 2022-10-18 DIAGNOSIS — R000 Tachycardia, unspecified: Secondary | ICD-10-CM | POA: Diagnosis not present

## 2022-10-18 DIAGNOSIS — R413 Other amnesia: Secondary | ICD-10-CM | POA: Diagnosis not present

## 2022-10-18 DIAGNOSIS — N3000 Acute cystitis without hematuria: Secondary | ICD-10-CM | POA: Diagnosis not present

## 2022-10-18 DIAGNOSIS — E119 Type 2 diabetes mellitus without complications: Secondary | ICD-10-CM | POA: Diagnosis not present

## 2022-10-19 DIAGNOSIS — E7800 Pure hypercholesterolemia, unspecified: Secondary | ICD-10-CM | POA: Diagnosis not present

## 2022-10-19 DIAGNOSIS — E1142 Type 2 diabetes mellitus with diabetic polyneuropathy: Secondary | ICD-10-CM | POA: Diagnosis not present

## 2022-10-19 DIAGNOSIS — I2510 Atherosclerotic heart disease of native coronary artery without angina pectoris: Secondary | ICD-10-CM | POA: Diagnosis not present

## 2022-10-19 DIAGNOSIS — K219 Gastro-esophageal reflux disease without esophagitis: Secondary | ICD-10-CM | POA: Diagnosis not present

## 2022-10-19 DIAGNOSIS — I10 Essential (primary) hypertension: Secondary | ICD-10-CM | POA: Diagnosis not present

## 2022-10-19 DIAGNOSIS — J439 Emphysema, unspecified: Secondary | ICD-10-CM | POA: Diagnosis not present

## 2022-10-23 ENCOUNTER — Encounter (HOSPITAL_BASED_OUTPATIENT_CLINIC_OR_DEPARTMENT_OTHER): Payer: Self-pay

## 2022-10-23 NOTE — Progress Notes (Unsigned)
Cardiology Office Note:    Date:  10/27/2022   ID:  Stephanie Fox, DOB 01-29-1951, MRN [id number]  PCP:  Mila Palmer, MD  Cardiologist:  None  Electrophysiologist:  None   Referring MD: Mila Palmer, MD   Chief Complaint  Patient presents with   Palpitations    History of Present Illness:    Stephanie Fox is a 72 y.o. female with a hx of COPD, T2DM, hypertension, hyperlipidemia, morbid obesity who is referred by Dr. Paulino Rily for evaluation of tachycardia.  She reports has been having palpitations over the last several months.  Reports happens multiple times a day, will feel like heart is racing and her heart rate will be up to 110s on Fitbit.  Occurs at rest, lasts short duration.  Denies any any chest pain, dyspnea, lightheadedness, syncope, lower extremity edema.  She does chair yoga and tai chi for exercise.  She smoked for 50 years, about 0.5 packs/day, quit in 2021.  Family history includes mother died of MI at 64.   Past Medical History:  Diagnosis Date   Anal or rectal pain    per pt / had 3 times  in last 3 years   Anemia    as a child    Arthritis    COPD (chronic obstructive pulmonary disease) (HCC)    early   DDD (degenerative disc disease), lumbosacral    l4-5, S1   Diabetes mellitus (HCC)    type II    Dyspnea    with exertion    Edema    lower extremities    GERD (gastroesophageal reflux disease)    in past   History of kidney stones    Hyperlipidemia    Hypertension    Low back pain    Spinal stenosis     Past Surgical History:  Procedure Laterality Date   BREAST REDUCTION SURGERY  12/2002   Bil   DILATATION & CURETTAGE/HYSTEROSCOPY WITH MYOSURE N/A 10/21/2019   Procedure: DILATATION & CURETTAGE/HYSTEROSCOPY WITH MYOSURE;  Surgeon: Adam Phenix, MD;  Location: [facility];  Service: Gynecology;  Laterality: N/A;   JOINT REPLACEMENT Bilateral 2017   KNEE SURGERY     LUMBAR EPIDURAL INJECTION     In Dec 2018,had 4 injection    REDUCTION MAMMAPLASTY Bilateral 15 years ago   thumb surgery  1990   right thumb   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   Procedure: BILATERAL TOTAL KNEE ARTHROPLASTY;  Surgeon: Durene Romans, MD;  Location: [facility];  Service: Orthopedics;  Laterality: Bilateral;  Adductor Block   underarm surgery  1980's   gland under right arm got infected due to shaving    Current Medications: Current Meds  Medication Sig   acetaminophen (TYLENOL) 500 MG tablet Take 500 mg by mouth every 6 (six) hours as needed.   Acetylcarnitine HCl (ACETYL L-CARNITINE PO) Take 2 tablets by mouth 2 (two) times daily.   Alpha-Lipoic Acid 300 MG CAPS Take 300 mg by mouth 2 (two) times daily.   ASPIRIN 81 PO aspirin 81 mg tablet,delayed release  Take 1 tablet every day by oral route.   cetirizine (ZYRTEC) 10 MG tablet Take 10 mg by mouth daily as needed.   cholecalciferol (VITAMIN D3) 25 MCG (1000 UNIT) tablet Take 1,000 Units by mouth daily.   cyanocobalamin (VITAMIN B12) 1000 MCG tablet Take 1,000 mcg by mouth daily.   dapagliflozin propanediol (FARXIGA) 10 MG TABS tablet Take 10 mg by mouth daily.   ezetimibe (ZETIA) 10  MG tablet Take 10 mg by mouth daily.   fluticasone (FLONASE) 50 MCG/ACT nasal spray Place into both nostrils daily.   metFORMIN (GLUCOPHAGE-XR) 500 MG 24 hr tablet Take 1,000 mg by mouth 2 (two) times daily with a meal.   montelukast (SINGULAIR) 10 MG tablet TAKE 1 TABLET BY MOUTH EVERYDAY AT BEDTIME   olmesartan (BENICAR) 20 MG tablet Take 20 mg by mouth daily.   Semaglutide, 1 MG/DOSE, (OZEMPIC, 1 MG/DOSE,) 4 MG/3ML SOPN Inject 1 mg into the skin once a week.   TURMERIC CURCUMIN PO Take 500 mg by mouth 3 (three) times daily.   vitamin E 400 UNIT capsule Take 400 Units by mouth 2 (two) times daily.      Allergies:   Molds & smuts, Elderberry, and Plegisol   Social History   Socioeconomic History   Marital status: Single    Spouse name: Not on file   Number of children: Not on file    Years of education: Not on file   Highest education level: Not on file  Occupational History   Not on file  Tobacco Use   Smoking status: Former    Packs/day: 0.70    Years: 49.00    Additional pack years: 0.00    Total pack years: 34.30    Types: Cigarettes    Quit date: 06/22/2019    Years since quitting: 3.3   Smokeless tobacco: Never  Substance and Sexual Activity   Alcohol use: Yes    Comment: rare   Drug use: No   Sexual activity: Not on file  Other Topics Concern   Not on file  Social History Narrative   Not on file   Social Determinants of Health   Financial Resource Strain: Not on file  Food Insecurity: Not on file  Transportation Needs: Not on file  Physical Activity: Not on file  Stress: Not on file  Social Connections: Not on file     Family History: The patient's family history is negative for Colon cancer.  ROS:   Please see the history of present illness.     All other systems reviewed and are negative.  EKGs/Labs/Other Studies Reviewed:    The following studies were reviewed today:   EKG:  10/26/22: sinus rhythm with PACs, rate 100, poor R wave progression  Recent Labs: No results found for requested labs within last 365 days.  Recent Lipid Panel No results found for: "CHOL", "TRIG", "HDL", "CHOLHDL", "VLDL", "LDLCALC", "LDLDIRECT"  Physical Exam:    VS:  BP 124/74 (BP Location: Right Arm, Patient Position: Sitting, Cuff Size: Large)   Pulse 100   Ht 5\' 6"  (1.676 m)   Wt 246 lb 14.4 oz (112 kg)   BMI 39.85 kg/m     Wt Readings from Last 3 Encounters:  10/26/22 246 lb 14.4 oz (112 kg)  03/09/22 243 lb 9.6 oz (110.5 kg)  11/18/19 (!) 263 lb (119.3 kg)     GEN:  Well nourished, well developed in no acute distress HEENT: Normal NECK: No JVD; No carotid bruits LYMPHATICS: No lymphadenopathy CARDIAC: RRR, no murmurs, rubs, gallops RESPIRATORY:  Clear to auscultation without rales, wheezing or rhonchi  ABDOMEN: Soft, non-tender,  non-distended MUSCULOSKELETAL:  No edema; No deformity  SKIN: Warm and dry NEUROLOGIC:  Alert and oriented x 3 PSYCHIATRIC:  Normal affect   ASSESSMENT:    1. Palpitations   2. Hypertension, unspecified type   3. Coronary artery disease involving native coronary artery of native heart without angina  pectoris   4. Hyperlipidemia, unspecified hyperlipidemia type   5. Morbid obesity (HCC)    PLAN:    Tachycardia/palpitations: Description concerning for arrhythmia, will evaluate with Zio patch x 7 days.  Check echocardiogram rule out structural heart disease  CAD: Significant coronary calcifications on CT chest 11/2021.  Has been unable to tolerate statins.  Currently on Zetia 10 mg daily, LDL 140 on 05/22/2022.  Refer to pharmacy lipid clinic to evaluate for PCSK9 inhibitor  Hypertension: On olmesartan 20 mg daily.  Appears controlled  Hyperlipidemia: On Zetia 10 mg daily.  LDL 140 on 05/22/2022.  Intolerant to statins, reports she tried both atorvastatin and rosuvastatin but had nausea/vomiting.  Significant coronary calcifications on CT chest 11/2021.  Goal LDL less than 70.  Referred to pharmacy lipid clinic to evaluate for PCSK9 inhibitor  T2DM: On Ozempic and Farxiga.  A1c 7.3% on 05/22/2022  Morbid obesity: Body mass index is 39.85 kg/m. On Ozempic  RTC in 4 months   Medication Adjustments/Labs and Tests Ordered: Current medicines are reviewed at length with the patient today.  Concerns regarding medicines are outlined above.  Orders Placed This Encounter  Procedures   AMB Referral to [facility] Pharm-D   LONG TERM MONITOR (3-14 DAYS)   EKG 12-Lead   ECHOCARDIOGRAM COMPLETE   No orders of the defined types were placed in this encounter.   Patient Instructions  Medication Instructions:  Continue same medications *If you need a refill on your cardiac medications before your next appointment, please call your pharmacy*   Lab Work: None  ordered   Testing/Procedures: Echo   Heart Monitor for 7 days ( ZIO )   Follow-Up: At [facility], you and your health needs are our priority.  As part of our continuing mission to provide you with exceptional heart care, we have created designated Provider Care Teams.  These Care Teams include your primary Cardiologist (physician) and Advanced Practice Providers (APPs -  Physician Assistants and Nurse Practitioners) who all work together to provide you with the care you need, when you need it.  We recommend signing up for the patient portal called "MyChart".  Sign up information is provided on this After Visit Summary.  MyChart is used to connect with patients for Virtual Visits (Telemedicine).  Patients are able to view lab/test results, encounter notes, upcoming appointments, etc.  Non-urgent messages can be sent to your provider as well.   To learn more about what you can do with MyChart, go to [email or web address].    Your next appointment:  4 months    Provider:  Dr.Gustie Bobb     Schedule appointment with Pharm D for Lipids      Signed, Little Ishikawa, MD  10/27/2022 10:02 PM    [facility]

## 2022-10-26 ENCOUNTER — Other Ambulatory Visit (HOSPITAL_BASED_OUTPATIENT_CLINIC_OR_DEPARTMENT_OTHER): Payer: PPO

## 2022-10-26 ENCOUNTER — Encounter (HOSPITAL_BASED_OUTPATIENT_CLINIC_OR_DEPARTMENT_OTHER): Payer: Self-pay | Admitting: Cardiology

## 2022-10-26 ENCOUNTER — Ambulatory Visit (HOSPITAL_BASED_OUTPATIENT_CLINIC_OR_DEPARTMENT_OTHER): Payer: PPO | Admitting: Cardiology

## 2022-10-26 VITALS — BP 124/74 | HR 100 | Ht 66.0 in | Wt 246.9 lb

## 2022-10-26 DIAGNOSIS — R002 Palpitations: Secondary | ICD-10-CM | POA: Diagnosis not present

## 2022-10-26 DIAGNOSIS — I10 Essential (primary) hypertension: Secondary | ICD-10-CM | POA: Diagnosis not present

## 2022-10-26 DIAGNOSIS — E785 Hyperlipidemia, unspecified: Secondary | ICD-10-CM

## 2022-10-26 DIAGNOSIS — I2510 Atherosclerotic heart disease of native coronary artery without angina pectoris: Secondary | ICD-10-CM | POA: Diagnosis not present

## 2022-10-26 NOTE — Patient Instructions (Signed)
Medication Instructions:  Continue same medications *If you need a refill on your cardiac medications before your next appointment, please call your pharmacy*   Lab Work: None ordered   Testing/Procedures: Echo   Heart Monitor for 7 days ( ZIO )   Follow-Up: At [facility], you and your health needs are our priority.  As part of our continuing mission to provide you with exceptional heart care, we have created designated Provider Care Teams.  These Care Teams include your primary Cardiologist (physician) and Advanced Practice Providers (APPs -  Physician Assistants and Nurse Practitioners) who all work together to provide you with the care you need, when you need it.  We recommend signing up for the patient portal called "MyChart".  Sign up information is provided on this After Visit Summary.  MyChart is used to connect with patients for Virtual Visits (Telemedicine).  Patients are able to view lab/test results, encounter notes, upcoming appointments, etc.  Non-urgent messages can be sent to your provider as well.   To learn more about what you can do with MyChart, go to [email or web address].    Your next appointment:  4 months    Provider:  Dr.Schumann     Schedule appointment with Pharm D for Lipids

## 2022-11-01 DIAGNOSIS — E1142 Type 2 diabetes mellitus with diabetic polyneuropathy: Secondary | ICD-10-CM | POA: Diagnosis not present

## 2022-11-01 DIAGNOSIS — E538 Deficiency of other specified B group vitamins: Secondary | ICD-10-CM | POA: Diagnosis not present

## 2022-11-01 DIAGNOSIS — E1165 Type 2 diabetes mellitus with hyperglycemia: Secondary | ICD-10-CM | POA: Diagnosis not present

## 2022-11-01 DIAGNOSIS — R809 Proteinuria, unspecified: Secondary | ICD-10-CM | POA: Diagnosis not present

## 2022-11-14 ENCOUNTER — Telehealth: Payer: Self-pay | Admitting: Cardiology

## 2022-11-14 DIAGNOSIS — R002 Palpitations: Secondary | ICD-10-CM | POA: Diagnosis not present

## 2022-11-14 DIAGNOSIS — I10 Essential (primary) hypertension: Secondary | ICD-10-CM | POA: Diagnosis not present

## 2022-11-14 NOTE — Telephone Encounter (Signed)
[facility] calling with abnormal cardiac results. Call transferred

## 2022-11-14 NOTE — Telephone Encounter (Signed)
Call received from [place] at [facility].  Pt. Wore ZioXT from July 5th thru the 12th. Monitor detected a total of 385 runs of SVT the longest lasted 12 min and 51 seconds with a max rate of 203 and average rate of 167.  Pt experienced 5 beats of Vtach with a max rate of 176 and avg rate of 160. Full report is available in EPIC. Jim Like MHA RN CCM

## 2022-11-15 NOTE — Telephone Encounter (Signed)
Called patient home phone did not accept call.Called patient's cell phone unable to leave a message voice mail full.

## 2022-11-15 NOTE — Telephone Encounter (Signed)
Called patient's brother left message on personal voice mail to have patient call me today.

## 2022-11-15 NOTE — Telephone Encounter (Signed)
See result note.  

## 2022-11-16 NOTE — Telephone Encounter (Signed)
Unable to reach patient by phone home or cell.   Sent message by [employer] .   Per chart patient reviewed mychart on 10/27/22 .- requesting patient to call office to confirm appointment in 12/10/18

## 2022-11-19 NOTE — Telephone Encounter (Signed)
Tried calling patient again about monitor results.Unable to reach patient.Letter mailed to patient's home advising her to call me for results.

## 2022-11-21 ENCOUNTER — Ambulatory Visit (HOSPITAL_COMMUNITY): Payer: PPO | Attending: Cardiology

## 2022-11-21 DIAGNOSIS — I10 Essential (primary) hypertension: Secondary | ICD-10-CM | POA: Insufficient documentation

## 2022-11-21 DIAGNOSIS — R002 Palpitations: Secondary | ICD-10-CM | POA: Diagnosis not present

## 2022-11-21 LAB — ECHOCARDIOGRAM COMPLETE
Area-P 1/2: 4.35 cm2
S' Lateral: 2.2 cm

## 2022-11-23 ENCOUNTER — Telehealth: Payer: Self-pay | Admitting: Cardiology

## 2022-11-23 NOTE — Telephone Encounter (Signed)
Patient is returning call in regards to heart monitor results. Please advise.

## 2022-11-23 NOTE — Telephone Encounter (Signed)
Call to patient home number, states The number is "screening calls and will not take your call"  Unable to LM.  Call to home number.  She is very rude wants to know why this isn't Stephanie Fox and advised she is not here. She then  states "your number says spam so that's why I blocked you!" .  She ask regarding results and then is upset why "the doctor says its fine (echo) if I have plaque!!. Advised this is a different test showing the valves and muscle of heart.  Advised she has an appt with Pharm at [facility] on Monday to go over her Lipids and POC.  She ask why she has another appt and advise the next appt is with the doctor to review all her tests and what the plan is going forward.  She states understanding

## 2022-11-26 ENCOUNTER — Telehealth (HOSPITAL_BASED_OUTPATIENT_CLINIC_OR_DEPARTMENT_OTHER): Payer: Self-pay | Admitting: Pharmacist Clinician (PhC)/ Clinical Pharmacy Specialist

## 2022-11-26 ENCOUNTER — Telehealth: Payer: Self-pay

## 2022-11-26 ENCOUNTER — Other Ambulatory Visit (HOSPITAL_COMMUNITY): Payer: Self-pay

## 2022-11-26 ENCOUNTER — Encounter (HOSPITAL_BASED_OUTPATIENT_CLINIC_OR_DEPARTMENT_OTHER): Payer: Self-pay | Admitting: Pharmacist Clinician (PhC)/ Clinical Pharmacy Specialist

## 2022-11-26 ENCOUNTER — Ambulatory Visit (HOSPITAL_BASED_OUTPATIENT_CLINIC_OR_DEPARTMENT_OTHER): Payer: PPO | Admitting: Pharmacist Clinician (PhC)/ Clinical Pharmacy Specialist

## 2022-11-26 DIAGNOSIS — E785 Hyperlipidemia, unspecified: Secondary | ICD-10-CM | POA: Insufficient documentation

## 2022-11-26 NOTE — Patient Instructions (Signed)
Your Results:             Your most recent labs Goal  LDL (lousy/bad cholesterol 140 70   Medication changes:  We will start the process to get Repatha covered by your insurance.  Once the prior authorization is complete, I will call/send a MyChart message to let you know and confirm pharmacy information.   You will take one injection every 14 days  Lab orders:  We want to repeat labs after 2-3 months.  We will send you a lab order to remind you once we get closer to that time.     Thank you for choosing [facility]

## 2022-11-26 NOTE — Assessment & Plan Note (Signed)
Assessment: Patient with CAD not at LDL goal of < 70 Most recent LDL 142f on 04/2022 Has been compliant with ezetimibe 10 mg daily Not able to tolerate statins secondary to nausea/vomiting Reviewed options for lowering LDL cholesterol, including PCSK-9 inhibitors, bempedoic acid and inclisiran.  Discussed mechanisms of action, dosing, side effects, potential decreases in LDL cholesterol and costs.  Also reviewed potential options for patient assistance.  Plan: Patient agreeable to starting Repatha 140 mg q14d Repeat labs after:  3 months Lipid Liver function

## 2022-11-26 NOTE — Progress Notes (Unsigned)
Office Visit    Patient Name: Stephanie Fox Date of Encounter: 11/26/2022  Primary Care Provider:  Mila Palmer, MD Primary Cardiologist:  None  Chief Complaint    Hyperlipidemia   Significant Past Medical History   CAD Significant calcifications on chest CT 11/2021  HTN Controlled on olmesartan 20 mg   DM2 1/24 A1c 7.3 on Ozempic, Farxiga  obesity BMI 39.87 at last visit; on Ozempic        Allergies  Allergen Reactions   Molds & Smuts Hives   Elderberry Hives   Plegisol Hives and Itching    History of Present Illness    Stephanie Fox is a 72 y.o. female patient of Dr Bjorn Pippin, in the office today to discuss options for cholesterol management.  She has many questions about the testing that has been done recently - echocardiogram, monitor and prior CT that showed coronary calcifications  Insurance Carrier:  [employer] -Diabetes and Heart Care Plan  LDL Cholesterol goal:  LDL < 70  Current Medications:  ezetimibe 10 mg   Previously tried: atorvastatin, rosuvastatin - N/V  Family Hx:  sister had multiple strokes, started in her 56's, died at 68; mother died from MI (pt believes was due to broken heart syndrome);    Social Hx: Tobacco: no Alcohol: no  Diet:   more eating at home now; protein is variety - lots of chicken and salmon; occasional beef or pork; vegetables mostly fresh; not much bread or pasta  Exercise:  PT weekly for back, chair yoga once weekly, tai chi once weekly   Accessory Clinical Findings   In [facility]: 04/2022  TC 226, TG 93, HDL 70, LDL 140  Lab Results  Component Value Date   ALT 19 12/05/2017   AST 15 12/05/2017   ALKPHOS 69 12/05/2017   BILITOT 0.6 12/05/2017   Lab Results  Component Value Date   CREATININE 1.08 (H) 10/19/2019   BUN 16 10/19/2019   NA 137 10/19/2019   K 4.1 10/19/2019   CL 104 10/19/2019   CO2 23 10/19/2019   No results found for: "HGBA1C"  Home Medications    Current Outpatient Medications  Medication Sig Dispense  Refill   acetaminophen (TYLENOL) 500 MG tablet Take 500 mg by mouth every 6 (six) hours as needed.     Acetylcarnitine HCl (ACETYL L-CARNITINE PO) Take 2 tablets by mouth 2 (two) times daily.     Alpha-Lipoic Acid 300 MG CAPS Take 300 mg by mouth 2 (two) times daily.     ASPIRIN 81 PO aspirin 81 mg tablet,delayed release  Take 1 tablet every day by oral route.     cetirizine (ZYRTEC) 10 MG tablet Take 10 mg by mouth daily as needed.     cholecalciferol (VITAMIN D3) 25 MCG (1000 UNIT) tablet Take 1,000 Units by mouth daily.     cyanocobalamin (VITAMIN B12) 1000 MCG tablet Take 1,000 mcg by mouth daily.     dapagliflozin propanediol (FARXIGA) 10 MG TABS tablet Take 10 mg by mouth daily.     ezetimibe (ZETIA) 10 MG tablet Take 10 mg by mouth daily.  6   fluticasone (FLONASE) 50 MCG/ACT nasal spray Place into both nostrils daily.     metFORMIN (GLUCOPHAGE-XR) 500 MG 24 hr tablet Take 1,000 mg by mouth 2 (two) times daily with a meal.     montelukast (SINGULAIR) 10 MG tablet TAKE 1 TABLET BY MOUTH EVERYDAY AT BEDTIME 90 tablet 3   olmesartan (BENICAR) 20 MG tablet Take 20  mg by mouth daily.     Semaglutide, 1 MG/DOSE, (OZEMPIC, 1 MG/DOSE,) 4 MG/3ML SOPN Inject 1 mg into the skin once a week.     TURMERIC CURCUMIN PO Take 500 mg by mouth 3 (three) times daily.     vitamin E 400 UNIT capsule Take 400 Units by mouth 2 (two) times daily.      No current facility-administered medications for this visit.     Assessment & Plan    Hyperlipidemia Assessment: Patient with CAD not at LDL goal of < 70 Most recent LDL 119f on 04/2022 Has been compliant with ezetimibe 10 mg daily Not able to tolerate statins secondary to nausea/vomiting Reviewed options for lowering LDL cholesterol, including PCSK-9 inhibitors, bempedoic acid and inclisiran.  Discussed mechanisms of action, dosing, side effects, potential decreases in LDL cholesterol and costs.  Also reviewed potential options for patient  assistance.  Plan: Patient agreeable to starting Repatha 140 mg q14d Repeat labs after:  3 months Lipid Liver function   Phillips Hay, PharmD CPP [facility] [address]  [place], [place] [address] [phone]  11/26/2022, 2:39 PM

## 2022-11-26 NOTE — Telephone Encounter (Signed)
Pharmacy Patient Advocate Encounter   Received notification from Physician's Office that prior authorization for REPATHA is required/requested.   Insurance verification completed.   The patient is insured through [employer]/ [employer] .   Per test claim: PA required; PA submitted to [employer] via CoverMyMeds Key/confirmation #/EOC [id number] Status is pending

## 2022-11-26 NOTE — Telephone Encounter (Signed)
Please do PA for Repatha injections

## 2022-11-26 NOTE — Telephone Encounter (Signed)
PA request has been Submitted. New Encounter created for follow up. For additional info see Pharmacy Prior Auth telephone encounter from 11/26/22.

## 2022-11-27 ENCOUNTER — Ambulatory Visit
Admission: RE | Admit: 2022-11-27 | Discharge: 2022-11-27 | Disposition: A | Discharge status: Home / Self Care | Payer: PPO | Source: Ambulatory Visit | Attending: Family Medicine | Admitting: Family Medicine

## 2022-11-27 DIAGNOSIS — Z87891 Personal history of nicotine dependence: Secondary | ICD-10-CM | POA: Diagnosis not present

## 2022-11-27 DIAGNOSIS — Z122 Encounter for screening for malignant neoplasm of respiratory organs: Secondary | ICD-10-CM

## 2022-11-28 ENCOUNTER — Other Ambulatory Visit (HOSPITAL_COMMUNITY): Payer: Self-pay

## 2022-11-28 NOTE — Telephone Encounter (Signed)
Pharmacy Patient Advocate Encounter  Received notification from [employer] that Prior Authorization for Repatha SureClick 140MG /ML auto-injectors has been APPROVED from 11/26/2022 to 05/29/2023. Ran test claim, Copay is $47.00. This test claim was processed through [facility]- copay amounts may vary at other pharmacies due to pharmacy/plan contracts, or as the patient moves through the different stages of their insurance plan.   PA #/Case ID/Reference #: [id number]

## 2022-11-28 NOTE — Telephone Encounter (Signed)
See other encounter.

## 2022-12-03 ENCOUNTER — Other Ambulatory Visit: Payer: Self-pay

## 2022-12-03 DIAGNOSIS — Z122 Encounter for screening for malignant neoplasm of respiratory organs: Secondary | ICD-10-CM

## 2022-12-03 DIAGNOSIS — Z87891 Personal history of nicotine dependence: Secondary | ICD-10-CM

## 2022-12-09 NOTE — Progress Notes (Unsigned)
Cardiology Office Note:    Date:  12/10/2022   ID:  Stephanie Fox, DOB Jul 10, 1950, MRN [id number]  PCP:  Mila Palmer, MD  Cardiologist:  None  Electrophysiologist:  None   Referring MD: Mila Palmer, MD   Chief Complaint  Patient presents with   Palpitations    History of Present Illness:    Stephanie Fox is a 72 y.o. female with a hx of COPD, T2DM, hypertension, hyperlipidemia, morbid obesity who presents for follow-up.  She was referred by Dr. Paulino Rily for evaluation of tachycardia, initially seen 10/26/2022.  She reports has been having palpitations over the last several months.  Reports happens multiple times a day, will feel like heart is racing and her heart rate will be up to 110s on Fitbit.  Occurs at rest, lasts short duration.  Denies any any chest pain, dyspnea, lightheadedness, syncope, lower extremity edema.  She does chair yoga and tai chi for exercise.  She smoked for 50 years, about 0.5 packs/day, quit in 2021.  Family history includes mother died of MI at 31.  Echocardiogram 11/21/2022 showed EF 60 to 65%, grade 1 diastolic dysfunction, normal RV function, small pericardial effusion, no significant valvular disease.  Zio patch x 7 days 10/2022 showed 385 episodes of SVT with longest lasting 13 minutes with average rate 167 bpm, occasional PACs (2%) and supraventricular couplets (4.2%), and 1 episode of NSVT lasting 5 beats.  Since last clinic visit, she reports she is doing okay.  Still having palpitations but has improved.  She denies any chest pain but reports he is short of breath with minimal exertion.  States that walking up 1 flight of stairs she will feel short of breath.  She denies any lightheadedness or syncope.  No lower extremity edema.  Reports has not been exercising.  Reports rare caffeine use.  Denies any alcohol use.  Past Medical History:  Diagnosis Date   Anal or rectal pain    per pt / had 3 times  in last 3 years   Anemia    as a child    Arthritis     COPD (chronic obstructive pulmonary disease) (HCC)    early   DDD (degenerative disc disease), lumbosacral    l4-5, S1   Diabetes mellitus (HCC)    type II    Dyspnea    with exertion    Edema    lower extremities    GERD (gastroesophageal reflux disease)    in past   History of kidney stones    Hyperlipidemia    Hypertension    Low back pain    Spinal stenosis     Past Surgical History:  Procedure Laterality Date   BREAST REDUCTION SURGERY  12/2002   Bil   DILATATION & CURETTAGE/HYSTEROSCOPY WITH MYOSURE N/A 10/21/2019   Procedure: DILATATION & CURETTAGE/HYSTEROSCOPY WITH MYOSURE;  Surgeon: Adam Phenix, MD;  Location: [facility];  Service: Gynecology;  Laterality: N/A;   JOINT REPLACEMENT Bilateral 2017   KNEE SURGERY     LUMBAR EPIDURAL INJECTION     In Dec 2018,had 4 injection   REDUCTION MAMMAPLASTY Bilateral 15 years ago   thumb surgery  1990   right thumb   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   Procedure: BILATERAL TOTAL KNEE ARTHROPLASTY;  Surgeon: Durene Romans, MD;  Location: [facility];  Service: Orthopedics;  Laterality: Bilateral;  Adductor Block   underarm surgery  1980's   gland under right arm got infected due to shaving  Current Medications: Current Meds  Medication Sig   acetaminophen (TYLENOL) 500 MG tablet Take 500 mg by mouth every 6 (six) hours as needed.   Acetylcarnitine HCl (ACETYL L-CARNITINE PO) Take 2 tablets by mouth 2 (two) times daily.   Alpha-Lipoic Acid 300 MG CAPS Take 300 mg by mouth 2 (two) times daily.   ASPIRIN 81 PO aspirin 81 mg tablet,delayed release  Take 1 tablet every day by oral route.   cetirizine (ZYRTEC) 10 MG tablet Take 10 mg by mouth daily as needed.   cholecalciferol (VITAMIN D3) 25 MCG (1000 UNIT) tablet Take 1,000 Units by mouth daily.   cyanocobalamin (VITAMIN B12) 1000 MCG tablet Take 1,000 mcg by mouth daily.   dapagliflozin propanediol (FARXIGA) 10 MG TABS tablet Take 10 mg by mouth  daily.   ezetimibe (ZETIA) 10 MG tablet Take 10 mg by mouth daily.   fluticasone (FLONASE) 50 MCG/ACT nasal spray Place into both nostrils daily.   metFORMIN (GLUCOPHAGE-XR) 500 MG 24 hr tablet Take 1,000 mg by mouth 2 (two) times daily with a meal.   metoprolol tartrate (LOPRESSOR) 25 MG tablet Take 1 tablet (25 mg total) by mouth 2 (two) times daily.   montelukast (SINGULAIR) 10 MG tablet TAKE 1 TABLET BY MOUTH EVERYDAY AT BEDTIME   olmesartan (BENICAR) 20 MG tablet Take 20 mg by mouth daily.   Semaglutide, 1 MG/DOSE, (OZEMPIC, 1 MG/DOSE,) 4 MG/3ML SOPN Inject 1 mg into the skin once a week.   TURMERIC CURCUMIN PO Take 500 mg by mouth 3 (three) times daily.   vitamin E 400 UNIT capsule Take 400 Units by mouth 2 (two) times daily.      Allergies:   Molds & smuts, Elderberry, and Plegisol   Social History   Socioeconomic History   Marital status: Single    Spouse name: Not on file   Number of children: Not on file   Years of education: Not on file   Highest education level: Not on file  Occupational History   Not on file  Tobacco Use   Smoking status: Former    Current packs/day: 0.00    Average packs/day: 0.7 packs/day for 49.0 years (34.3 ttl pk-yrs)    Types: Cigarettes    Start date: 06/22/1970    Quit date: 06/22/2019    Years since quitting: 3.4   Smokeless tobacco: Never  Substance and Sexual Activity   Alcohol use: Yes    Comment: rare   Drug use: No   Sexual activity: Not on file  Other Topics Concern   Not on file  Social History Narrative   Not on file   Social Determinants of Health   Financial Resource Strain: Not on file  Food Insecurity: Not on file  Transportation Needs: Not on file  Physical Activity: Not on file  Stress: Not on file  Social Connections: Not on file     Family History: The patient's family history is negative for Colon cancer.  ROS:   Please see the history of present illness.     All other systems reviewed and are  negative.  EKGs/Labs/Other Studies Reviewed:    The following studies were reviewed today:   EKG:  10/26/22: sinus rhythm with PACs, rate 100, poor R wave progression  Recent Labs: No results found for requested labs within last 365 days.  Recent Lipid Panel No results found for: "CHOL", "TRIG", "HDL", "CHOLHDL", "VLDL", "LDLCALC", "LDLDIRECT"  Physical Exam:    VS:  BP (!) 148/82 (BP Location:  Left Arm, Patient Position: Sitting, Cuff Size: Normal)   Pulse 95   Ht 5\' 6"  (1.676 m)   Wt 245 lb 3.2 oz (111.2 kg)   SpO2 98%   BMI 39.58 kg/m     Wt Readings from Last 3 Encounters:  12/10/22 245 lb 3.2 oz (111.2 kg)  10/26/22 246 lb 14.4 oz (112 kg)  03/09/22 243 lb 9.6 oz (110.5 kg)     GEN:  Well nourished, well developed in no acute distress HEENT: Normal NECK: No JVD; No carotid bruits LYMPHATICS: No lymphadenopathy CARDIAC: RRR, no murmurs, rubs, gallops RESPIRATORY:  Clear to auscultation without rales, wheezing or rhonchi  ABDOMEN: Soft, non-tender, non-distended MUSCULOSKELETAL:  No edema; No deformity  SKIN: Warm and dry NEUROLOGIC:  Alert and oriented x 3 PSYCHIATRIC:  Normal affect   ASSESSMENT:    1. SVT (supraventricular tachycardia)   2. Hypertension, unspecified type   3. Dyspnea on exertion   4. Somnolence, daytime   5. Coronary artery disease involving native coronary artery of native heart, unspecified whether angina present   6. Hyperlipidemia, unspecified hyperlipidemia type     PLAN:    SVT: Reported palpitations. Echocardiogram 11/21/2022 showed EF 60 to 65%, grade 1 diastolic dysfunction, normal RV function, small pericardial effusion, no significant valvular disease.  Zio patch x 7 days 10/2022 showed 385 episodes of SVT with longest lasting 13 minutes with average rate 167 bpm, occasional PACs (2%) and supraventricular couplets (4.2%), and 1 episode of NSVT lasting 5 beats. -Start metoprolol 25 mg twice daily -Check CMET, magnesium,  TSH -Untreated OSA may be contributing, will check sleep study  CAD: Significant coronary calcifications on CT chest 11/2021.   -She is reporting dyspnea on exertion that may represent anginal equivalent.  Recommend stress PET for further evaluation -Has been unable to tolerate statins.  Currently on Zetia 10 mg daily, LDL 140 on 05/22/2022.  Referred to pharmacy lipid clinic, starting on Repatha  Hypertension: On olmesartan 20 mg daily.  BP elevated in clinic today, will add metoprolol as above  Hyperlipidemia: On Zetia 10 mg daily.  LDL 140 on 05/22/2022.  Intolerant to statins, reports she tried both atorvastatin and rosuvastatin but had nausea/vomiting.  Significant coronary calcifications on CT chest 11/2021.  Goal LDL less than 70.  Referred to pharmacy lipid clinic to evaluate for PCSK9 inhibitor, starting on Repatha  T2DM: On Ozempic and Farxiga.  A1c 7.1% on 11/01/2022  Morbid obesity: Body mass index is 39.58 kg/m. On Ozempic  Daytime somnolence: STOP-BANG 4.  Check [facility] sleep study  RTC in 3 months  Informed Consent   Shared Decision Making/Informed Consent The risks [chest pain, shortness of breath, cardiac arrhythmias, dizziness, blood pressure fluctuations, myocardial infarction, stroke/transient ischemic attack, nausea, vomiting, allergic reaction, radiation exposure, metallic taste sensation and life-threatening complications (estimated to be 1 in 10,000)], benefits (risk stratification, diagnosing coronary artery disease, treatment guidance) and alternatives of a cardiac PET stress test were discussed in detail with Ms. Clare and she agrees to proceed.       Medication Adjustments/Labs and Tests Ordered: Current medicines are reviewed at length with the patient today.  Concerns regarding medicines are outlined above.  Orders Placed This Encounter  Procedures   NM PET CT CARDIAC PERFUSION MULTI W/ABSOLUTE BLOODFLOW   Comprehensive metabolic panel   Magnesium   TSH    [facility] Sleep Study   Meds ordered this encounter  Medications   metoprolol tartrate (LOPRESSOR) 25 MG tablet    Sig: Take  1 tablet (25 mg total) by mouth 2 (two) times daily.    Dispense:  180 tablet    Refill:  3    Patient Instructions  Medication Instructions:  Metoprolol Tartrate 25 mg twice a day *If you need a refill on your cardiac medications before your next appointment, please call your pharmacy*   Lab Work: CMP, TSH, Mag- today If you have labs (blood work) drawn today and your tests are completely normal, you will receive your results only by: MyChart Message (if you have MyChart) OR A paper copy in the mail If you have any lab test that is abnormal or we need to change your treatment, we will call you to review the results.   Testing/Procedures: How to Prepare for Your Cardiac PET/CT Stress Test:  1. Please do not take these medications before your test:   Medications that may interfere with the cardiac pharmacological stress agent (ex. nitrates - including erectile dysfunction medications, isosorbide mononitrate, tamulosin or beta-blockers) the day of the exam. (Erectile dysfunction medication should be held for at least 72 hrs prior to test) Theophylline containing medications for 12 hours. Dipyridamole 48 hours prior to the test. Your remaining medications may be taken with water.  2. Nothing to eat or drink, except water, 3 hours prior to arrival time.   NO caffeine/decaffeinated products, or chocolate 12 hours prior to arrival.  3. NO perfume, cologne or lotion  4. Total time is 1 to 2 hours; you may want to bring reading material for the waiting time.  5. Please report to Radiology at the [facility] Main Entrance 30 minutes early for your test.  [address] [place], [place] [address]  6. Please report to Radiology at [facility] Main Entrance, medical mall, 30 mins prior to your test.  [address]  [place], [place]  [phone]  Diabetic Preparation:  Hold oral medications. You may take NPH and Lantus insulin. Do not take Humalog or Humulin R (Regular Insulin) the day of your test. Check blood sugars prior to leaving the house. If able to eat breakfast prior to 3 hour fasting, you may take all medications, including your insulin, Do not worry if you miss your breakfast dose of insulin - start at your next meal.  IF YOU THINK YOU MAY BE PREGNANT, OR ARE NURSING PLEASE INFORM THE TECHNOLOGIST.  In preparation for your appointment, medication and supplies will be purchased.  Appointment availability is limited, so if you need to cancel or reschedule, please call the Radiology Department at [phone] [facility]) OR [phone] [facility])  24 hours in advance to avoid a cancellation fee of $100.00  What to Expect After you Arrive:  Once you arrive and check in for your appointment, you will be taken to a preparation room within the Radiology Department.  A technologist or Nurse will obtain your medical history, verify that you are correctly prepped for the exam, and explain the procedure.  Afterwards,  an IV will be started in your arm and electrodes will be placed on your skin for EKG monitoring during the stress portion of the exam. Then you will be escorted to the PET/CT scanner.  There, staff will get you positioned on the scanner and obtain a blood pressure and EKG.  During the exam, you will continue to be connected to the EKG and blood pressure machines.  A small, safe amount of a radioactive tracer will be injected in your IV to obtain  a series of pictures of your heart along with an injection of a stress agent.    After your Exam:  It is recommended that you eat a meal and drink a caffeinated beverage to counter act any effects of the stress agent.  Drink plenty of fluids for the remainder of the day and urinate frequently for the first couple of hours after the exam.  Your  doctor will inform you of your test results within 7-10 business days.  For more information and frequently asked questions, please visit our website : [email or web address]  For questions about your test or how to prepare for your test, please call: Cardiac Imaging Nurse Navigators Office: [phone]   WatchPAT?  Is a FDA cleared portable home sleep study test that uses a watch and 3 points of contact to monitor 7 different channels, including your heart rate, oxygen saturations, body position, snoring, and chest motion.  The study is easy to use from the comfort of your own home and accurately detect sleep apnea.  Before bed, you attach the chest sensor, attached the sleep apnea bracelet to your nondominant hand, and attach the finger probe.  After the study, the raw data is downloaded from the watch and scored for apnea events.   For more information: [email or web address]  Patient Testing Instructions:  Do not put battery into the device until bedtime when you are ready to begin the test. Please call the support number if you need assistance after following the instructions below: 24 hour support line- [phone] or [facility] support at [phone] (option 2)  Download the IntelWatchPAT One" app through the google play store or App Store  Be sure to turn on or enable access to bluetooth in settlings on your smartphone/ device  Make sure no other bluetooth devices are on and within the vicinity of your smartphone/ device and WatchPAT watch during testing.  Make sure to leave your smart phone/ device plugged in and charging all night.  When ready for bed:  Follow the instructions step by step in the WatchPAT One App to activate the testing device. For additional instructions, including video instruction, visit the WatchPAT One video on Youtube. You can search for WatchPat One within Youtube (video is 4 minutes and 18 seconds) or enter:  https://youtube/watch?[email or web address]_vbiwxE Please note: You will be prompted to enter a Pin to connect via bluetooth when starting the test. The PIN will be assigned to you when you receive the test.  The device is disposable, but it recommended that you retain the device until you receive a call letting you know the study has been received and the results have been interpreted.  We will let you know if the study did not transmit to [place] properly after the test is completed. You do not need to call us to confirm the receipt of the test.  Please complete the test within 48 hours of receiving PIN.   Frequently Asked Questions:  What is Watch Dennie Bible one?  A single use fully disposable home sleep apnea testing device and will not need to be returned after completion.  What are the requirements to use WatchPAT one?  The be able to have a successful watchpat one sleep study, you should have your Watch pat one device, your smart phone, watch pat one app, your PIN number and Internet access What type of phone do I need?  You should have a smart phone that uses Android 5.1 and above or any Iphone with IOS 10 and above How  can I download the WatchPAT one app?  Based on your device type search for WatchPAT one app either in google play for android devices or APP store for Iphone's Where will I get my PIN for the study?  Your PIN will be provided by your physician's office. It is used for authentication and if you lose/forget your PIN, please reach out to your providers office.  I do not have Internet at home. Can I do WatchPAT one study?  WatchPAT One needs Internet connection throughout the night to be able to transmit the sleep data. You can use your home/local internet or your cellular's data package. However, it is always recommended to use home/local Internet. It is estimated that between 20MB-30MB will be used with each study.However, the application will be looking for space in the phone to start the study.   What happens if I lose internet or bluetooth connection?  During the internet disconnection, your phone will not be able to transmit the sleep data. All the data, will be stored in your phone. As soon as the internet connection is back on, the phone will being sending the sleep data. During the bluetooth disconnection, WatchPAT one will not be able to to send the sleep data to your phone. Data will be kept in the [facility] one until two devices have bluetooth connection back on. As soon as the connection is back on, WatchPAT one will send the sleep data to the phone.  How long do I need to wear the WatchPAT one?  After you start the study, you should wear the device at least 6 hours.  How far should I keep my phone from the device?  During the night, your phone should be within 15 feet.  What happens if I leave the room for restroom or other reasons?  Leaving the room for any reason will not cause any problem. As soon as your get back to the room, both devices will reconnect and will continue to send the sleep data. Can I use my phone during the sleep study?  Yes, you can use your phone as usual during the study. But it is recommended to put your watchpat one on when you are ready to go to bed.  How will I get my study results?  A soon as you completed your study, your sleep data will be sent to the provider. They will then share the results with you when they are ready.     Follow-Up: At [facility], you and your health needs are our priority.  As part of our continuing mission to provide you with exceptional heart care, we have created designated Provider Care Teams.  These Care Teams include your primary Cardiologist (physician) and Advanced Practice Providers (APPs -  Physician Assistants and Nurse Practitioners) who all work together to provide you with the care you need, when you need it.  We recommend signing up for the patient portal called "MyChart".  Sign up information is  provided on this After Visit Summary.  MyChart is used to connect with patients for Virtual Visits (Telemedicine).  Patients are able to view lab/test results, encounter notes, upcoming appointments, etc.  Non-urgent messages can be sent to your provider as well.   To learn more about what you can do with MyChart, go to [email or web address].    Your next appointment:    Already Scheduled appointment 03/15/23 at 1:20 with Dr Bjorn Pippin    Signed, Little Ishikawa, MD  12/10/2022 10:21 AM  [facility]

## 2022-12-10 ENCOUNTER — Ambulatory Visit: Payer: PPO | Attending: Cardiology | Admitting: Cardiology

## 2022-12-10 ENCOUNTER — Encounter: Payer: Self-pay | Admitting: Cardiology

## 2022-12-10 VITALS — BP 148/82 | HR 95 | Ht 66.0 in | Wt 245.2 lb

## 2022-12-10 DIAGNOSIS — E785 Hyperlipidemia, unspecified: Secondary | ICD-10-CM

## 2022-12-10 DIAGNOSIS — I471 Supraventricular tachycardia, unspecified: Secondary | ICD-10-CM

## 2022-12-10 DIAGNOSIS — I10 Essential (primary) hypertension: Secondary | ICD-10-CM | POA: Diagnosis not present

## 2022-12-10 DIAGNOSIS — R400 Somnolence: Secondary | ICD-10-CM

## 2022-12-10 DIAGNOSIS — I2510 Atherosclerotic heart disease of native coronary artery without angina pectoris: Secondary | ICD-10-CM | POA: Diagnosis not present

## 2022-12-10 DIAGNOSIS — R0609 Other forms of dyspnea: Secondary | ICD-10-CM

## 2022-12-10 MED ORDER — METOPROLOL TARTRATE 25 MG PO TABS: 25.0000 mg | ORAL_TABLET | Freq: Two times a day (BID) | ORAL | 3 refills | Status: DC

## 2022-12-10 NOTE — Patient Instructions (Signed)
Medication Instructions:  Metoprolol Tartrate 25 mg twice a day *If you need a refill on your cardiac medications before your next appointment, please call your pharmacy*   Lab Work: CMP, TSH, Mag- today If you have labs (blood work) drawn today and your tests are completely normal, you will receive your results only by: MyChart Message (if you have MyChart) OR A paper copy in the mail If you have any lab test that is abnormal or we need to change your treatment, we will call you to review the results.   Testing/Procedures: How to Prepare for Your Cardiac PET/CT Stress Test:  1. Please do not take these medications before your test:   Medications that may interfere with the cardiac pharmacological stress agent (ex. nitrates - including erectile dysfunction medications, isosorbide mononitrate, tamulosin or beta-blockers) the day of the exam. (Erectile dysfunction medication should be held for at least 72 hrs prior to test) Theophylline containing medications for 12 hours. Dipyridamole 48 hours prior to the test. Your remaining medications may be taken with water.  2. Nothing to eat or drink, except water, 3 hours prior to arrival time.   NO caffeine/decaffeinated products, or chocolate 12 hours prior to arrival.  3. NO perfume, cologne or lotion  4. Total time is 1 to 2 hours; you may want to bring reading material for the waiting time.  5. Please report to Radiology at the [facility] Main Entrance 30 minutes early for your test.  [address] [place], [place] [address]  6. Please report to Radiology at [facility] Main Entrance, medical mall, 30 mins prior to your test.  [address]  [place], [place]  [phone]  Diabetic Preparation:  Hold oral medications. You may take NPH and Lantus insulin. Do not take Humalog or Humulin R (Regular Insulin) the day of your test. Check blood sugars prior to leaving the house. If able to eat  breakfast prior to 3 hour fasting, you may take all medications, including your insulin, Do not worry if you miss your breakfast dose of insulin - start at your next meal.  IF YOU THINK YOU MAY BE PREGNANT, OR ARE NURSING PLEASE INFORM THE TECHNOLOGIST.  In preparation for your appointment, medication and supplies will be purchased.  Appointment availability is limited, so if you need to cancel or reschedule, please call the Radiology Department at [phone] [facility]) OR [phone] [facility])  24 hours in advance to avoid a cancellation fee of $100.00  What to Expect After you Arrive:  Once you arrive and check in for your appointment, you will be taken to a preparation room within the Radiology Department.  A technologist or Nurse will obtain your medical history, verify that you are correctly prepped for the exam, and explain the procedure.  Afterwards,  an IV will be started in your arm and electrodes will be placed on your skin for EKG monitoring during the stress portion of the exam. Then you will be escorted to the PET/CT scanner.  There, staff will get you positioned on the scanner and obtain a blood pressure and EKG.  During the exam, you will continue to be connected to the EKG and blood pressure machines.  A small, safe amount of a radioactive tracer will be injected in your IV to obtain a series of pictures of your heart along with an injection of a stress agent.    After your Exam:  It is recommended that you eat a meal  and drink a caffeinated beverage to counter act any effects of the stress agent.  Drink plenty of fluids for the remainder of the day and urinate frequently for the first couple of hours after the exam.  Your doctor will inform you of your test results within 7-10 business days.  For more information and frequently asked questions, please visit our website : [email or web address]  For questions about your test or how to prepare for your test, please  call: Cardiac Imaging Nurse Navigators Office: [phone]   WatchPAT?  Is a FDA cleared portable home sleep study test that uses a watch and 3 points of contact to monitor 7 different channels, including your heart rate, oxygen saturations, body position, snoring, and chest motion.  The study is easy to use from the comfort of your own home and accurately detect sleep apnea.  Before bed, you attach the chest sensor, attached the sleep apnea bracelet to your nondominant hand, and attach the finger probe.  After the study, the raw data is downloaded from the watch and scored for apnea events.   For more information: [email or web address]  Patient Testing Instructions:  Do not put battery into the device until bedtime when you are ready to begin the test. Please call the support number if you need assistance after following the instructions below: 24 hour support line- [phone] or [facility] support at [phone] (option 2)  Download the IntelWatchPAT One" app through the google play store or App Store  Be sure to turn on or enable access to bluetooth in settlings on your smartphone/ device  Make sure no other bluetooth devices are on and within the vicinity of your smartphone/ device and WatchPAT watch during testing.  Make sure to leave your smart phone/ device plugged in and charging all night.  When ready for bed:  Follow the instructions step by step in the WatchPAT One App to activate the testing device. For additional instructions, including video instruction, visit the WatchPAT One video on Youtube. You can search for WatchPat One within Youtube (video is 4 minutes and 18 seconds) or enter: https://youtube/[email or web address]?[email or web address] Please note: You will be prompted to enter a Pin to connect via bluetooth when starting the test. The PIN will be assigned to you when you receive the test.  The device is disposable, but it recommended that you retain the device until you  receive a call letting you know the study has been received and the results have been interpreted.  We will let you know if the study did not transmit to [place] properly after the test is completed. You do not need to call us to confirm the receipt of the test.  Please complete the test within 48 hours of receiving PIN.   Frequently Asked Questions:  What is Watch Dennie Bible one?  A single use fully disposable home sleep apnea testing device and will not need to be returned after completion.  What are the requirements to use WatchPAT one?  The be able to have a successful watchpat one sleep study, you should have your Watch pat one device, your smart phone, watch pat one app, your PIN number and Internet access What type of phone do I need?  You should have a smart phone that uses Android 5.1 and above or any Iphone with IOS 10 and above How can I download the WatchPAT one app?  Based on your device type search for WatchPAT one app either in google play for android devices or APP store for Iphone's  Where will I get my PIN for the study?  Your PIN will be provided by your physician's office. It is used for authentication and if you lose/forget your PIN, please reach out to your providers office.  I do not have Internet at home. Can I do WatchPAT one study?  WatchPAT One needs Internet connection throughout the night to be able to transmit the sleep data. You can use your home/local internet or your cellular's data package. However, it is always recommended to use home/local Internet. It is estimated that between 20MB-30MB will be used with each study.However, the application will be looking for space in the phone to start the study.  What happens if I lose internet or bluetooth connection?  During the internet disconnection, your phone will not be able to transmit the sleep data. All the data, will be stored in your phone. As soon as the internet connection is back on, the phone will being sending the  sleep data. During the bluetooth disconnection, WatchPAT one will not be able to to send the sleep data to your phone. Data will be kept in the [facility] one until two devices have bluetooth connection back on. As soon as the connection is back on, WatchPAT one will send the sleep data to the phone.  How long do I need to wear the WatchPAT one?  After you start the study, you should wear the device at least 6 hours.  How far should I keep my phone from the device?  During the night, your phone should be within 15 feet.  What happens if I leave the room for restroom or other reasons?  Leaving the room for any reason will not cause any problem. As soon as your get back to the room, both devices will reconnect and will continue to send the sleep data. Can I use my phone during the sleep study?  Yes, you can use your phone as usual during the study. But it is recommended to put your watchpat one on when you are ready to go to bed.  How will I get my study results?  A soon as you completed your study, your sleep data will be sent to the provider. They will then share the results with you when they are ready.     Follow-Up: At [facility], you and your health needs are our priority.  As part of our continuing mission to provide you with exceptional heart care, we have created designated Provider Care Teams.  These Care Teams include your primary Cardiologist (physician) and Advanced Practice Providers (APPs -  Physician Assistants and Nurse Practitioners) who all work together to provide you with the care you need, when you need it.  We recommend signing up for the patient portal called "MyChart".  Sign up information is provided on this After Visit Summary.  MyChart is used to connect with patients for Virtual Visits (Telemedicine).  Patients are able to view lab/test results, encounter notes, upcoming appointments, etc.  Non-urgent messages can be sent to your provider as well.   To learn more  about what you can do with MyChart, go to [email or web address].    Your next appointment:    Already Scheduled appointment 03/15/23 at 1:20 with Dr Bjorn Pippin

## 2022-12-11 LAB — COMPREHENSIVE METABOLIC PANEL
ALT: 20 IU/L (ref 0–32)
AST: 24 IU/L (ref 0–40)
Albumin: 4.1 g/dL (ref 3.9–4.9)
Alkaline Phosphatase: 141 IU/L — ABNORMAL HIGH (ref 44–121)
BUN/Creatinine Ratio: 14 (ref 12–28)
BUN: 11 mg/dL (ref 8–27)
Bilirubin Total: <0.2 mg/dL (ref 0.0–1.2)
CO2: 25 mmol/L (ref 20–29)
Calcium: 10.2 mg/dL (ref 8.7–10.3)
Chloride: 102 mmol/L (ref 96–106)
Creatinine, Ser: 0.81 mg/dL (ref 0.57–1.00)
Globulin, Total: 3.5 g/dL (ref 1.5–4.5)
Glucose: 111 mg/dL — ABNORMAL HIGH (ref 70–99)
Potassium: 4.6 mmol/L (ref 3.5–5.2)
Sodium: 140 mmol/L (ref 134–144)
Total Protein: 7.6 g/dL (ref 6.0–8.5)
eGFR: 78 mL/min/{1.73_m2} (ref >59)

## 2022-12-11 LAB — TSH: TSH: 1.43 u[IU]/mL (ref 0.450–4.500)

## 2022-12-11 LAB — MAGNESIUM: Magnesium: 1.8 mg/dL (ref 1.6–2.3)

## 2022-12-13 DIAGNOSIS — L298 Other pruritus: Secondary | ICD-10-CM | POA: Diagnosis not present

## 2022-12-20 ENCOUNTER — Telehealth: Payer: Self-pay | Admitting: Cardiology

## 2022-12-20 NOTE — Telephone Encounter (Signed)
  Pt c/o medication issue:  1. Name of Medication:   metoprolol tartrate (LOPRESSOR) 25 MG tablet  ezetimibe (ZETIA) 10 MG tablet   2. How are you currently taking this medication (dosage and times per day)? As  written   3. Are you having a reaction (difficulty breathing--STAT)? No   4. What is your medication issue? Pt is calling and would like to verify if she need to take both of these medications

## 2022-12-20 NOTE — Telephone Encounter (Signed)
Will send this message to Dr. Bjorn Pippin and his nurse for advice.

## 2022-12-21 NOTE — Telephone Encounter (Signed)
Called patient's home #.Unable to reach patient.Phone is being screened by call blocker.# has been blocked. Called patient's cell # no answer.Unable to leave a message voice mail is full.

## 2022-12-25 ENCOUNTER — Encounter (HOSPITAL_COMMUNITY): Payer: Self-pay

## 2022-12-26 ENCOUNTER — Telehealth (HOSPITAL_COMMUNITY): Payer: Self-pay | Admitting: Emergency Medicine

## 2022-12-26 NOTE — Telephone Encounter (Signed)
Attempted to call patient regarding upcoming cardiac CT appointment. °Left message on voicemail with name and callback number °Sara Wallace RN Navigator Cardiac Imaging °[facility] °[phone] Office °[phone] Cell ° °

## 2022-12-27 ENCOUNTER — Ambulatory Visit
Admission: RE | Admit: 2022-12-27 | Discharge: 2022-12-27 | Disposition: A | Discharge status: Home / Self Care | Payer: PPO | Source: Ambulatory Visit | Attending: Cardiology | Admitting: Cardiology

## 2022-12-27 DIAGNOSIS — R0609 Other forms of dyspnea: Secondary | ICD-10-CM | POA: Insufficient documentation

## 2022-12-27 LAB — NM PET CT CARDIAC PERFUSION MULTI W/ABSOLUTE BLOODFLOW
LV dias vol: 91 mL (ref 46–106)
LV sys vol: 27 mL
MBFR: 2.04
Nuc Rest EF: 65 %
Nuc Stress EF: 70 %
Peak HR: 115 {beats}/min
Rest HR: 93 {beats}/min
Rest MBF: 1.28 ml/g/min
Rest Nuclear Isotope Dose: 24.9 mCi
SRS: 0
SSS: 0
ST Depression (mm): 0 mm
Stress MBF: 2.61 ml/g/min
Stress Nuclear Isotope Dose: 24.9 mCi
TID: 1.07

## 2022-12-27 MED ORDER — REGADENOSON 0.4 MG/5ML IV SOLN
0.4000 mg | Freq: Once | INTRAVENOUS | Status: AC
  Administered 2022-12-27: 0.4 mg via INTRAVENOUS

## 2022-12-27 MED ORDER — RUBIDIUM RB82 GENERATOR (RUBYFILL)
25.0000 | PACK | Freq: Once | INTRAVENOUS | Status: AC
  Administered 2022-12-27: 24.94 via INTRAVENOUS

## 2022-12-27 MED ORDER — RUBIDIUM RB82 GENERATOR (RUBYFILL)
25.0000 | PACK | Freq: Once | INTRAVENOUS | Status: AC
  Administered 2022-12-27: 24.93 via INTRAVENOUS

## 2023-01-02 NOTE — Telephone Encounter (Signed)
Unable to reach patient # listed has been blocked.

## 2023-01-07 NOTE — Progress Notes (Signed)
Patient agreement reviewed and signed on 12/10/2022.  WatchPAT issued to patient on 12/10/2022 by Sandrea Hughs, RN. Patient aware to not open the WatchPAT box until contacted with the activation PIN. Patient profile initialized in CloudPAT on 12/10/2022 by Brunetta Genera, CMA. Device serial number: [id number]

## 2023-01-24 DIAGNOSIS — L239 Allergic contact dermatitis, unspecified cause: Secondary | ICD-10-CM | POA: Diagnosis not present

## 2023-01-24 DIAGNOSIS — L2989 Other pruritus: Secondary | ICD-10-CM | POA: Diagnosis not present

## 2023-01-28 DIAGNOSIS — L239 Allergic contact dermatitis, unspecified cause: Secondary | ICD-10-CM | POA: Diagnosis not present

## 2023-01-31 DIAGNOSIS — L239 Allergic contact dermatitis, unspecified cause: Secondary | ICD-10-CM | POA: Diagnosis not present

## 2023-02-04 DIAGNOSIS — E7800 Pure hypercholesterolemia, unspecified: Secondary | ICD-10-CM | POA: Diagnosis not present

## 2023-02-04 DIAGNOSIS — I10 Essential (primary) hypertension: Secondary | ICD-10-CM | POA: Diagnosis not present

## 2023-02-04 DIAGNOSIS — R809 Proteinuria, unspecified: Secondary | ICD-10-CM | POA: Diagnosis not present

## 2023-02-04 DIAGNOSIS — E1142 Type 2 diabetes mellitus with diabetic polyneuropathy: Secondary | ICD-10-CM | POA: Diagnosis not present

## 2023-02-04 DIAGNOSIS — E1165 Type 2 diabetes mellitus with hyperglycemia: Secondary | ICD-10-CM | POA: Diagnosis not present

## 2023-02-05 ENCOUNTER — Other Ambulatory Visit: Payer: Self-pay | Admitting: Family Medicine

## 2023-02-05 DIAGNOSIS — Z1231 Encounter for screening mammogram for malignant neoplasm of breast: Secondary | ICD-10-CM

## 2023-02-28 ENCOUNTER — Ambulatory Visit
Admission: RE | Admit: 2023-02-28 | Discharge: 2023-02-28 | Disposition: A | Discharge status: Home / Self Care | Payer: PPO | Source: Ambulatory Visit | Attending: Family Medicine | Admitting: Family Medicine

## 2023-02-28 DIAGNOSIS — Z1231 Encounter for screening mammogram for malignant neoplasm of breast: Secondary | ICD-10-CM

## 2023-03-13 ENCOUNTER — Other Ambulatory Visit (HOSPITAL_COMMUNITY)
Admission: RE | Admit: 2023-03-13 | Discharge: 2023-03-13 | Disposition: A | Discharge status: Home / Self Care | Payer: PPO | Source: Ambulatory Visit | Attending: Obstetrics | Admitting: Obstetrics

## 2023-03-13 ENCOUNTER — Ambulatory Visit: Payer: PPO | Admitting: Obstetrics

## 2023-03-13 ENCOUNTER — Encounter: Payer: Self-pay | Admitting: Obstetrics

## 2023-03-13 VITALS — BP 142/75 | HR 96 | Wt 237.1 lb

## 2023-03-13 DIAGNOSIS — E139 Other specified diabetes mellitus without complications: Secondary | ICD-10-CM

## 2023-03-13 DIAGNOSIS — R300 Dysuria: Secondary | ICD-10-CM | POA: Diagnosis not present

## 2023-03-13 DIAGNOSIS — N898 Other specified noninflammatory disorders of vagina: Secondary | ICD-10-CM | POA: Insufficient documentation

## 2023-03-13 DIAGNOSIS — E669 Obesity, unspecified: Secondary | ICD-10-CM | POA: Diagnosis not present

## 2023-03-13 LAB — POCT URINALYSIS DIPSTICK
Bilirubin, UA: NEGATIVE
Blood, UA: NEGATIVE
Glucose, UA: POSITIVE — AB
Ketones, UA: NEGATIVE
Leukocytes, UA: NEGATIVE
Nitrite, UA: POSITIVE
Odor: NEGATIVE
Protein, UA: NEGATIVE
Spec Grav, UA: 1.01 (ref 1.010–1.025)
Urobilinogen, UA: 0.2 U/dL
pH, UA: 5 (ref 5.0–8.0)

## 2023-03-13 MED ORDER — CEFUROXIME AXETIL 500 MG PO TABS: 500.0000 mg | ORAL_TABLET | Freq: Two times a day (BID) | ORAL | 0 refills | Status: DC

## 2023-03-13 MED ORDER — FLUCONAZOLE 150 MG PO TABS: 150.0000 mg | ORAL_TABLET | Freq: Once | ORAL | 0 refills | Status: AC

## 2023-03-13 NOTE — Progress Notes (Unsigned)
Pt is in the office reporting vaginal discharge, slight odor, no irritation. Pt states that has not been sexually active in 18 years.

## 2023-03-13 NOTE — Progress Notes (Unsigned)
Patient ID: Stephanie Fox, female   DOB: [birth year], 72 y.o.   MRN: [id number]  Chief Complaint  Patient presents with   GYN\    HPI Stephanie Fox is a 72 y.o. female.  Complains of vaginal discharge and foamy urine.  Denies vaginal itching or odor.  Also denies dysuria. HPI  Past Medical History:  Diagnosis Date   Anal or rectal pain    per pt / had 3 times  in last 3 years   Anemia    as a child    Arthritis    COPD (chronic obstructive pulmonary disease) (HCC)    early   DDD (degenerative disc disease), lumbosacral    l4-5, S1   Diabetes mellitus (HCC)    type II    Dyspnea    with exertion    Edema    lower extremities    GERD (gastroesophageal reflux disease)    in past   History of kidney stones    Hyperlipidemia    Hypertension    Low back pain    Spinal stenosis     Past Surgical History:  Procedure Laterality Date   BREAST BIOPSY Left 01/2022   BREAST REDUCTION SURGERY  12/2002   Bil   DILATATION & CURETTAGE/HYSTEROSCOPY WITH MYOSURE N/A 10/21/2019   Procedure: DILATATION & CURETTAGE/HYSTEROSCOPY WITH MYOSURE;  Surgeon: Adam Phenix, MD;  Location: [facility];  Service: Gynecology;  Laterality: N/A;   JOINT REPLACEMENT Bilateral 2017   KNEE SURGERY     LUMBAR EPIDURAL INJECTION     In Dec 2018,had 4 injection   REDUCTION MAMMAPLASTY Bilateral 15 years ago   thumb surgery  1990   right thumb   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   Procedure: BILATERAL TOTAL KNEE ARTHROPLASTY;  Surgeon: Durene Romans, MD;  Location: [facility];  Service: Orthopedics;  Laterality: Bilateral;  Adductor Block   underarm surgery  1980's   gland under right arm got infected due to shaving    Family History  Problem Relation Age of Onset   Colon cancer Neg Hx     Social History Social History   Tobacco Use   Smoking status: Former    Current packs/day: 0.00    Average packs/day: 0.7 packs/day for 49.0 years (34.3 ttl pk-yrs)    Types: Cigarettes     Start date: 06/22/1970    Quit date: 06/22/2019    Years since quitting: 3.7   Smokeless tobacco: Never  Substance Use Topics   Alcohol use: Yes    Comment: rare   Drug use: No    Allergies  Allergen Reactions   Molds & Smuts Hives   Elderberry Hives   Plegisol Hives and Itching    Current Outpatient Medications  Medication Sig Dispense Refill   acetaminophen (TYLENOL) 500 MG tablet Take 500 mg by mouth every 6 (six) hours as needed.     Acetylcarnitine HCl (ACETYL L-CARNITINE PO) Take 2 tablets by mouth 2 (two) times daily.     Alpha-Lipoic Acid 300 MG CAPS Take 300 mg by mouth 2 (two) times daily.     Ascorbic Acid (VITAMIN C) 100 MG tablet Take 100 mg by mouth daily.     ASPIRIN 81 PO aspirin 81 mg tablet,delayed release  Take 1 tablet every day by oral route.     cetirizine (ZYRTEC) 10 MG tablet Take 10 mg by mouth daily as needed.     cholecalciferol (VITAMIN D3) 25 MCG (1000 UNIT) tablet Take 1,000 Units  by mouth daily.     cyanocobalamin (VITAMIN B12) 1000 MCG tablet Take 1,000 mcg by mouth daily.     dapagliflozin propanediol (FARXIGA) 10 MG TABS tablet Take 10 mg by mouth daily.     ezetimibe (ZETIA) 10 MG tablet Take 10 mg by mouth daily.  6   fluticasone (FLONASE) 50 MCG/ACT nasal spray Place into both nostrils daily.     metFORMIN (GLUCOPHAGE-XR) 500 MG 24 hr tablet Take 1,000 mg by mouth 2 (two) times daily with a meal.     metoprolol tartrate (LOPRESSOR) 25 MG tablet Take 1 tablet (25 mg total) by mouth 2 (two) times daily. 180 tablet 3   montelukast (SINGULAIR) 10 MG tablet TAKE 1 TABLET BY MOUTH EVERYDAY AT BEDTIME 90 tablet 3   olmesartan (BENICAR) 20 MG tablet Take 20 mg by mouth daily.     Semaglutide, 1 MG/DOSE, (OZEMPIC, 1 MG/DOSE,) 4 MG/3ML SOPN Inject 1 mg into the skin once a week.     TURMERIC CURCUMIN PO Take 500 mg by mouth 3 (three) times daily.     vitamin E 400 UNIT capsule Take 400 Units by mouth 2 (two) times daily.      No current  facility-administered medications for this visit.    Review of Systems Review of Systems Constitutional: negative for fatigue and weight loss Respiratory: negative for cough and wheezing Cardiovascular: negative for chest pain, fatigue and palpitations Gastrointestinal: negative for abdominal pain and change in bowel habits Genitourinary: positive for vaginal discharge and foamy urine Integument/breast: negative for nipple discharge Musculoskeletal:negative for myalgias Neurological: negative for gait problems and tremors Behavioral/Psych: negative for abusive relationship, depression Endocrine: negative for temperature intolerance      Blood pressure (!) 142/75, pulse 96, weight 237 lb 1.6 oz (107.5 kg).  Physical Exam Physical Exam General:   Alert and no distress  Skin:   no rash or abnormalities  Lungs:   clear to auscultation bilaterally  Heart:   regular rate and rhythm, S1, S2 normal, no murmur, click, rub or gallop  Breasts:   normal without suspicious masses, skin or nipple changes or axillary nodes  Abdomen:  normal findings: no organomegaly, soft, non-tender and no hernia  Pelvis:  External genitalia: normal general appearance Urinary system: urethral meatus normal and bladder without fullness, nontender Vaginal: normal without tenderness, induration or masses Cervix: normal appearance Adnexa: normal bimanual exam Uterus: anteverted and non-tender, normal size   I have spent a total of 20 minutes of face-to-face and non-face-to-face time, excluding clinical staff time, reviewing notes and preparing to see patient, ordering tests and/or medications, and counseling the patient.   Data Reviewed Wet prep and cultures  Assessment    1. Vaginal discharge Rx: - Cervicovaginal ancillary only( [facility]) - fluconazole (DIFLUCAN) 150 MG tablet; Take 1 tablet (150 mg total) by mouth once for 1 dose.  Dispense: 1 tablet; Refill: 0  2. Dysuria Rx: - Urine Culture -  POCT Urinalysis Dipstick - cefUROXime (CEFTIN) 500 MG tablet; Take 1 tablet (500 mg total) by mouth 2 (two) times daily with a meal.  Dispense: 14 tablet; Refill: 0  3. Obesity (BMI 35.0-39.9 without comorbidity) - taking Ozempic -= weight reduction with the aid of diet, exercise and behavioral modification recommended  4. 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

## 2023-03-14 LAB — CERVICOVAGINAL ANCILLARY ONLY
Bacterial Vaginitis (gardnerella): NEGATIVE
Candida Glabrata: POSITIVE — AB
Candida Vaginitis: NEGATIVE
Comment: NEGATIVE
Comment: NEGATIVE
Comment: NEGATIVE

## 2023-03-15 ENCOUNTER — Ambulatory Visit: Payer: PPO | Admitting: Cardiology

## 2023-03-20 LAB — URINE CULTURE

## 2023-03-23 NOTE — Progress Notes (Unsigned)
Cardiology Office Note:    Date:  03/28/2023   ID:  Stephanie Fox, DOB 08-09-1950, MRN [id number]  PCP:  Mila Palmer, MD  Cardiologist:  Little Ishikawa, MD     Referring MD: Mila Palmer, MD   Chief Complaint: follow-up of palpitations and dyspnea on exertion  History of Present Illness:    Stephanie Fox is a 72 y.o. female with a history of coronary artery calcifications noted on prior CT but negative cardiac PET stress test in 12/2022,  palpitations with paroxysmal SVT noted on monitor in 10/2022,  hypertension, hyperlipidemia, type 2 diabetes mellitus, COPD, and morbid obesity who is followed by Dr. Bjorn Pippin and presents today for follow-up.  Patient was referred to Dr. Bjorn Pippin in 10/2022 for further evaluation fo palpitations. Zio monitor and Echo were ordered for further evaluation. Monitor showed sinus rhythm with 385 episodes of paroxysmal SVT with the longest episode last 13 minutes as well as one 5 beat run of NSVT and occasional PACs. Echo showed LVEF of 60-65% with normal wall motion and grade 1 diastolic dysfunction. She was last seen by Dr. Bjorn Pippin in 11/2022 at which time she was still having palpitations but they had improved some. She denied any chest pain but she did report shortness of breath with minimal exertion such as going up 1 flight of stairs. She was started on Metoprolol. Cardiac PET stress test was ordered to rule out her dyspnea as an anginal equivalent. Itmar sleep study was also ordered and she was referred to out lipid clinic for consideration of PCSK9 inhibitor given inability to tolerate statins. Cardiac PET was low risk with no evidence of ischemia.   Patient presents today for follow-up. She continues to have an elevated heart rate with her heart rate quickly rising to the 120s with minimal activity. She states she is no longer feeling the palpitations in her neck since starting the Metoprolol but she still describes a vague sensation of heart racing.  Her bigger concern is the fatigue and dyspnea with minimal exertion which she thinks has worsened since being on the Metoprolol. She denies any wheezing. She has a history of tobacco abuse and reports she has been told she has early COPD/ emphysema before. She went to [facility] for Thanksgiving and states she had to stop frequently while walking. She denies any shortness of breath at rest. No orthopnea, PND, or edema. No chest pain. No lightheadedness, dizziness, or syncope.   Her heart rates was initially in the 110s upon arrival to the office. EKG showed sinus tachycardia, rate 107 bpm, with occasional PACs. Heart rate fluctuated between the 90s and low 100s during visit which seems to be her baseline. She denies any caffeine use, alcohol use, or drug use.   EKGs/Labs/Other Studies Reviewed:    The following studies were reviewed:  Monitor 10/26/2022 to 11/02/2022: Patient had a min HR of 64 bpm, max HR of 203 bpm, and avg HR of 95 bpm. Predominant underlying rhythm was Sinus Rhythm. 1 run of Ventricular Tachycardia occurred lasting 5 beats with a max rate of 176 bpm (avg 160 bpm). 385 Supraventricular Tachycardia runs occurred, the run with the fastest interval lasting 12 mins 51 secs with a max rate of 203 bpm (avg 167 bpm); the run with the fastest interval was also the longest. Some episodes of Supraventricular Tachycardia may be possible Atrial Tachycardia with variable block. Supraventricular Tachycardia was detected within +/- 45 seconds of symptomatic patient event(s). Isolated SVEs were occasional (2.0%, 19013), SVE  Couplets were occasional (4.2%, 20024), and SVE Triplets were rare (<1.0%, 1114). Isolated VEs were rare (<1.0%), and no VE Couplets or VE Triplets were present. Ventricular Bigeminy was present. Inverted QRS complexes possibly due to inverted placement of device. MD notification criteria for Supraventricular Tachycardia met - report posted prior to notification per account request (AG).     385 episodes of SVT, longest lasting 13 minutes with average rate 167 bpm   Occasional PACs (2%) and supraventricular couplets (4.2%)   1 episode of NSVT lasting 5 beats _______________  Echocardiogram 11/21/2022: Impressions:  1. Left ventricular ejection fraction, by estimation, is 60 to 65%. The  left ventricle has normal function. The left ventricle has no regional  wall motion abnormalities. Left ventricular diastolic parameters are  consistent with Grade I diastolic  dysfunction (impaired relaxation).   2. Right ventricular systolic function is normal. The right ventricular  size is normal. There is normal pulmonary artery systolic pressure. The  estimated right ventricular systolic pressure is 30.8 mmHg.   3. A small pericardial effusion is present. The pericardial effusion is  posterior to the left ventricle.   4. The mitral valve is normal in structure. No evidence of mitral valve  regurgitation. No evidence of mitral stenosis.   5. The aortic valve is normal in structure. Aortic valve regurgitation is  not visualized. No aortic stenosis is present.   6. The inferior vena cava is normal in size with greater than 50%  respiratory variability, suggesting right atrial pressure of 3 mmHg.   Comparison(s): Frequent PVCs are seen during the study.  atherosclerotic heart disease _______________  Cardiac PET Stress Test 12/27/2022:   The study is normal. The study is low risk.   LV perfusion is normal. There is no evidence of ischemia. There is no evidence of infarction.   Rest left ventricular function is normal. Rest EF: 65%. Stress left ventricular function is normal. Stress EF: 70%. End diastolic cavity size is normal. End systolic cavity size is normal. No evidence of transient ischemic dilation (TID) noted.   Myocardial blood flow was computed to be 1.35ml/g/min at rest and 2.24ml/g/min at stress. Global myocardial blood flow reserve was 2.04 and was normal. High resting flows.    Coronary calcium was present on the attenuation correction CT images. Severe coronary calcifications were present. Coronary calcifications were present in the left anterior descending artery, left circumflex artery and right coronary artery distribution(s).   EKG:  EKG ordered today.   EKG Interpretation Date/Time:  Thursday March 28 2023 11:50:39 EST Ventricular Rate:  107 PR Interval:  172 QRS Duration:  90 QT Interval:  388 QTC Calculation: 517 R Axis:   11  Text Interpretation: Sinus tachycardia with Premature supraventricular complexes Possible Left atrial enlargement RSR' or QR pattern in V1 suggests right ventricular conduction delay Nonspecific T wave abnormality QTc 468 ms when using Framingham formula When compared with ECG of 26-Oct-2022 10:26, No significant change since last tracing Confirmed by Marjie Skiff [phone]) on 03/28/2023 12:49:34 PM    Recent Labs: 12/10/2022: ALT 20; BUN 11; Creatinine, Ser 0.81; Magnesium 1.8; Potassium 4.6; Sodium 140; TSH 1.430  Recent Lipid Panel No results found for: "CHOL", "TRIG", "HDL", "CHOLHDL", "VLDL", "LDLCALC", "LDLDIRECT"  Physical Exam:    Vital Signs: BP (!) 148/72   Pulse (!) 111   Ht 5\' 7"  (1.702 m)   Wt 238 lb (108 kg)   SpO2 94%   BMI 37.28 kg/m     Wt Readings from Last  3 Encounters:  03/28/23 238 lb (108 kg)  03/13/23 237 lb 1.6 oz (107.5 kg)  12/10/22 245 lb 3.2 oz (111.2 kg)     General: 72 y.o. African-American female in no acute distress. HEENT: Normocephalic and atraumatic. Sclera clear.  Neck: Supple. No JVD. Heart: Sinus tachycardiac with normal rate. No murmurs, gallops, or rubs.  Lungs: No increased work of breathing. Clear to ausculation bilaterally. No wheezes, rhonchi, or rales.  Abdomen: Soft, non-distended, and non-tender to palpation.  Extremities: No lower extremity edema.   Skin: Warm and dry. Neuro: No focal deficits. Psych: Normal affect. Responds appropriately.   Assessment:     1. Tachycardia   2. Palpitations   3. Paroxysmal SVT (supraventricular tachycardia) (HCC)   4. Coronary artery calcification   5. Dyspnea on exertion   6. Primary hypertension   7. Hyperlipidemia, unspecified hyperlipidemia type   8. Type 2 diabetes mellitus with obesity (HCC)     Plan:    Tachycardia Palpitations Paroxysmal SVT Patient has a history of palpitations with known paroxysmal SVT. Monitor in 10/2022 showed 385 episodes of paroxysmal SVT with the longest episode last 13 minutes as well as one 5 beat run of NSVT and occasional PACs. - She continues to have palpitations and elevated heart rates. Resting heart rates often in the 90s and quickly increase to the 120s with minimal exertion. She also describes significant fatigue and dyspnea on exertion. - EKG today shows sinus tachycardia, rate 107 bpm, with PACs.. - Will  stop Lopressor 25mg  twice daily given reports of fatigue and worsening shortness of breath since starting this. Will start Cardizem CD 180mg  daily instead.  - Itmar sleep study was ordered at last visit. However, patient states she never received the code for this so has not completed this yet. Will follow-up on this.  - Will follow-up with her in about 2 months. If she remains symptomatic, may need to consider EP referral for consideration of ablation.   Coronary Artery Calcifications She has had coronary artery calcifications noted on prior chest CT scans dating back to at least 2019. Recent cardiac PET stress test in 12/2022 showed severe coronary calcifications in the LAD, LCX, and RCA distributions but was negative for ischemia or infarction. - No chest pain.  - Continue Aspirin 81mg  daily.  - Intolerant to statins in the past. Continue Zetia. Please see information regarding Repatha below.   Dyspnea on Exertion During last visit in 11/2022, she reported dyspnea with minimal exertion. Echo in 10/2022 showed LVEF of 60-65% with grade 1 diastolic dysfunction.  Cardiac PET stress test in 12/2022 was low risk with no evidence of ischemia.  - She continues to have dyspnea with minimal exertion which she is very concerned about. She thinks it actually got worse after starting the Metoprolol. Will stop this and switch to Cardizem CD as above. - We discussed that her cardiac work-up has been reassuring in regards to her dyspnea. Suspect deconditioning and obesity may be playing a role. However, she also has a history of tobacco use and quit 4 years ago. Most recent CT scan in 11/2022 showed was suggestive of underlying COPD. Recommended following up with PCP on this. No additional cardiac work-up necessary at this time.   Hypertension BP mildly elevated at 148/72.  - Continue Olmesartan 20mg  daily.  - Will stop Lopressor and switch to Cardizem CD 180mg  daily as above.  - Will follow-up on BP at next visit. If still elevated, may need to increase Olmesartan or  Cardizem.   Hyperlipidemia LDL 140 in 04/2022. - Intolerant to statins.  - Continue Zetia 10mg  daily. - She was seen in our [facility] in 11/2022 and Repatha was recommended. She tells me that her insurance would not cover this since she has not had a heart attack. However, per phone note from 11/26/2022, it looks like her prior authorization was approved and copay would cost $47. Will follow-up with PharmD on this.   Type 2 Diabetes Mellitus - On Metformin and Ozempic.  - Management per PCP.  Disposition: Follow up in about 2 months.    Signed, Corrin Parker, PA-C  03/28/2023 1:23 PM    [facility]

## 2023-03-28 ENCOUNTER — Telehealth: Payer: Self-pay | Admitting: *Deleted

## 2023-03-28 ENCOUNTER — Ambulatory Visit: Payer: PPO | Attending: Cardiology | Admitting: Student

## 2023-03-28 ENCOUNTER — Encounter: Payer: Self-pay | Admitting: Student

## 2023-03-28 VITALS — BP 148/72 | HR 111 | Ht 67.0 in | Wt 238.0 lb

## 2023-03-28 DIAGNOSIS — R0609 Other forms of dyspnea: Secondary | ICD-10-CM

## 2023-03-28 DIAGNOSIS — I471 Supraventricular tachycardia, unspecified: Secondary | ICD-10-CM

## 2023-03-28 DIAGNOSIS — E785 Hyperlipidemia, unspecified: Secondary | ICD-10-CM

## 2023-03-28 DIAGNOSIS — E1169 Type 2 diabetes mellitus with other specified complication: Secondary | ICD-10-CM

## 2023-03-28 DIAGNOSIS — E669 Obesity, unspecified: Secondary | ICD-10-CM | POA: Diagnosis not present

## 2023-03-28 DIAGNOSIS — R002 Palpitations: Secondary | ICD-10-CM

## 2023-03-28 DIAGNOSIS — I2510 Atherosclerotic heart disease of native coronary artery without angina pectoris: Secondary | ICD-10-CM

## 2023-03-28 DIAGNOSIS — I10 Essential (primary) hypertension: Secondary | ICD-10-CM

## 2023-03-28 DIAGNOSIS — R000 Tachycardia, unspecified: Secondary | ICD-10-CM

## 2023-03-28 MED ORDER — DILTIAZEM HCL ER COATED BEADS 180 MG PO CP24: 180.0000 mg | ORAL_CAPSULE | Freq: Every day | ORAL | 3 refills | Status: DC

## 2023-03-28 NOTE — Patient Instructions (Addendum)
Medication Instructions:  Stop: Metoprolol Tartrate (Lopressor) Start: Diltiazem (Cardizem CD) 180 mg once daily *If you need a refill on your cardiac medications before your next appointment, please call your pharmacy*  Lab Work: None  Follow-Up: At [facility], you and your health needs are our priority.  As part of our continuing mission to provide you with exceptional heart care, we have created designated Provider Care Teams.  These Care Teams include your primary Cardiologist (physician) and Advanced Practice Providers (APPs -  Physician Assistants and Nurse Practitioners) who all work together to provide you with the care you need, when you need it.  Your next appointment:   2 month(s)  Provider:   Marjie Skiff, PA-C      Other Instructions Sleep study coordinator will call you tomorrow with PIN for sleep study.

## 2023-03-28 NOTE — Telephone Encounter (Signed)
Ordering provider: DR Bjorn Pippin Associated diagnoses: R40.0 WatchPAT PA obtained on 03/28/2023 by Stephanie Fox, CMA. Authorization: No; tracking ID PENDING Patient notified of PIN ([phone]) on 03/28/2023 via Notification Method: phone.  SUSPENDED Service Dates: 03/28/2023 - 06/26/2023  Phone note routed to covering staff for follow-up.  Instructions for covering staff:  Please contact patient in 2 weeks if WatchPAT study results are not available yet. Remind patient to complete test.  If patient declines to proceed with test, please confirm that box is unopened and remind patient to return it to the office within 30 days. Route phone note to [facility] SLEEP STUDIES pool for tracking.  If box has been opened, please route phone note to Stephanie Fox (billing department).

## 2023-03-29 NOTE — Telephone Encounter (Signed)
**Note De-Identified Yogesh Cominsky Obfuscation** Itamar-HST PA approval letter received from [employer]. Authorization #: [id number] Valid dates: 03/28/2023-06/26/2023

## 2023-04-02 ENCOUNTER — Ambulatory Visit: Payer: PPO | Admitting: Obstetrics

## 2023-04-02 ENCOUNTER — Encounter: Payer: Self-pay | Admitting: Obstetrics

## 2023-04-02 VITALS — BP 146/83 | HR 100 | Ht 67.0 in | Wt 236.0 lb

## 2023-04-02 DIAGNOSIS — B379 Candidiasis, unspecified: Secondary | ICD-10-CM

## 2023-04-02 NOTE — Progress Notes (Signed)
Patient ID: Stephanie Fox, female   DOB: [birth year], 72 y.o.   MRN: [id number]  No chief complaint on file.   HPI Stephanie Fox is a 72 y.o. female.  History of Cadida Glabrata vaginitis.  Has completed course of Boric Acid suppositions. HPI  Past Medical History:  Diagnosis Date   Anal or rectal pain    per pt / had 3 times  in last 3 years   Anemia    as a child    Arthritis    COPD (chronic obstructive pulmonary disease) (HCC)    early   DDD (degenerative disc disease), lumbosacral    l4-5, S1   Diabetes mellitus (HCC)    type II    Dyspnea    with exertion    Edema    lower extremities    GERD (gastroesophageal reflux disease)    in past   History of kidney stones    Hyperlipidemia    Hypertension    Low back pain    Spinal stenosis     Past Surgical History:  Procedure Laterality Date   BREAST BIOPSY Left 01/2022   BREAST REDUCTION SURGERY  12/2002   Bil   DILATATION & CURETTAGE/HYSTEROSCOPY WITH MYOSURE N/A 10/21/2019   Procedure: DILATATION & CURETTAGE/HYSTEROSCOPY WITH MYOSURE;  Surgeon: Adam Phenix, MD;  Location: [facility];  Service: Gynecology;  Laterality: N/A;   JOINT REPLACEMENT Bilateral 2017   KNEE SURGERY     LUMBAR EPIDURAL INJECTION     In Dec 2018,had 4 injection   REDUCTION MAMMAPLASTY Bilateral 15 years ago   thumb surgery  1990   right thumb   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   Procedure: BILATERAL TOTAL KNEE ARTHROPLASTY;  Surgeon: Durene Romans, MD;  Location: [facility];  Service: Orthopedics;  Laterality: Bilateral;  Adductor Block   underarm surgery  1980's   gland under right arm got infected due to shaving    Family History  Problem Relation Age of Onset   Colon cancer Neg Hx     Social History Social History   Tobacco Use   Smoking status: Former    Current packs/day: 0.00    Average packs/day: 0.7 packs/day for 49.0 years (34.3 ttl pk-yrs)    Types: Cigarettes    Start date: 06/22/1970    Quit date:  06/22/2019    Years since quitting: 3.7   Smokeless tobacco: Never  Substance Use Topics   Alcohol use: Yes    Comment: rare   Drug use: No    Allergies  Allergen Reactions   Molds & Smuts Hives   Elderberry Hives   Plegisol Hives and Itching    Current Outpatient Medications  Medication Sig Dispense Refill   acetaminophen (TYLENOL) 500 MG tablet Take 500 mg by mouth every 6 (six) hours as needed.     Acetylcarnitine HCl (ACETYL L-CARNITINE PO) Take 2 tablets by mouth 2 (two) times daily.     Alpha-Lipoic Acid 300 MG CAPS Take 300 mg by mouth 2 (two) times daily.     Ascorbic Acid (VITAMIN C) 100 MG tablet Take 100 mg by mouth daily.     ASPIRIN 81 PO aspirin 81 mg tablet,delayed release  Take 1 tablet every day by oral route.     cefUROXime (CEFTIN) 500 MG tablet Take 1 tablet (500 mg total) by mouth 2 (two) times daily with a meal. 14 tablet 0   cetirizine (ZYRTEC) 10 MG tablet Take 10 mg by mouth daily as  needed.     cholecalciferol (VITAMIN D3) 25 MCG (1000 UNIT) tablet Take 1,000 Units by mouth daily.     cyanocobalamin (VITAMIN B12) 1000 MCG tablet Take 1,000 mcg by mouth daily.     dapagliflozin propanediol (FARXIGA) 10 MG TABS tablet Take 10 mg by mouth daily.     diltiazem (CARDIZEM CD) 180 MG 24 hr capsule Take 1 capsule (180 mg total) by mouth daily. 90 capsule 3   ezetimibe (ZETIA) 10 MG tablet Take 10 mg by mouth daily.  6   fluticasone (FLONASE) 50 MCG/ACT nasal spray Place into both nostrils daily.     metFORMIN (GLUCOPHAGE-XR) 500 MG 24 hr tablet Take 1,000 mg by mouth 2 (two) times daily with a meal.     montelukast (SINGULAIR) 10 MG tablet TAKE 1 TABLET BY MOUTH EVERYDAY AT BEDTIME 90 tablet 3   olmesartan (BENICAR) 20 MG tablet Take 20 mg by mouth daily.     Semaglutide, 1 MG/DOSE, (OZEMPIC, 1 MG/DOSE,) 4 MG/3ML SOPN Inject 1 mg into the skin once a week.     TURMERIC CURCUMIN PO Take 500 mg by mouth 3 (three) times daily.     vitamin E 400 UNIT capsule Take 400  Units by mouth 2 (two) times daily.      No current facility-administered medications for this visit.    Review of Systems Review of Systems Constitutional: negative for fatigue and weight loss Respiratory: negative for cough and wheezing Cardiovascular: negative for chest pain, fatigue and palpitations Gastrointestinal: negative for abdominal pain and change in bowel habits Genitourinary:negative Integument/breast: negative for nipple discharge Musculoskeletal:negative for myalgias Neurological: negative for gait problems and tremors Behavioral/Psych: negative for abusive relationship, depression Endocrine: negative for temperature intolerance      Blood pressure (!) 146/83, pulse 100, height 5\' 7"  (1.702 m), weight 236 lb (107 kg).  Physical Exam Physical Exam General:   Alert and no distress  Skin:   no rash or abnormalities  Lungs:   clear to auscultation bilaterally  Heart:   regular rate and rhythm, S1, S2 normal, no murmur, click, rub or gallop  Breasts:   normal without suspicious masses, skin or nipple changes or axillary nodes  Abdomen:  normal findings: no organomegaly, soft, non-tender and no hernia  Pelvis:  External genitalia: normal general appearance Urinary system: urethral meatus normal and bladder without fullness, nontender Vaginal: normal without tenderness, induration or masses Cervix: normal appearance Adnexa: normal bimanual exam Uterus: anteverted and non-tender, normal size    I have spent a total of 20 minutes of face-to-face time, excluding clinical staff time, reviewing notes and preparing to see patient, ordering tests and/or medications, and counseling the patient.   Data Reviewed Results of labs  Assessment     1. Candida glabrata infection - responded well to Boric Acid vaginal suppositories     Plan   Follow up prn  Stephanie Livingston A. Romin Divita, MD, FACOG Attending Obstetrician & Gynecologist, [facility] for [facility],  [facility], [place] 04/02/2023

## 2023-04-02 NOTE — Progress Notes (Signed)
Pt presents for f/u. Pt reports improvement in symptoms.

## 2023-04-09 DIAGNOSIS — N390 Urinary tract infection, site not specified: Secondary | ICD-10-CM | POA: Diagnosis not present

## 2023-04-09 DIAGNOSIS — R399 Unspecified symptoms and signs involving the genitourinary system: Secondary | ICD-10-CM | POA: Diagnosis not present

## 2023-05-06 DIAGNOSIS — R82998 Other abnormal findings in urine: Secondary | ICD-10-CM | POA: Diagnosis not present

## 2023-05-09 DIAGNOSIS — E1165 Type 2 diabetes mellitus with hyperglycemia: Secondary | ICD-10-CM | POA: Diagnosis not present

## 2023-05-22 ENCOUNTER — Telehealth: Payer: Self-pay | Admitting: Gastroenterology

## 2023-05-22 NOTE — Telephone Encounter (Signed)
Patient has referral for anemia. Patient last seen with Dr. Elnoria Howard in 08/2022. Patient states she does not wish to continue care. Patient will have previous records sent for review.

## 2023-05-24 NOTE — Telephone Encounter (Signed)
Good morning Dr. Lavon Paganini,    We received a referral for patient for microcytic anemia. Patient was seen with Dr. Elnoria Howard in 08/2022. Patient was last seen with you in 2019. Patient stated she would like to transfer her care back due not longer wanting to continue her care with Dr. Elnoria Howard. Patient's previous records are [employer] for you to review and advise on scheduling.    Thank you.

## 2023-05-24 NOTE — Telephone Encounter (Signed)
 Patient advised.

## 2023-05-24 NOTE — Telephone Encounter (Signed)
 Request received to transfer GI care from outside practice to [facility].  We appreciate the interest in our practice, however at this time we cannot accommodate this transfer.

## 2023-05-25 NOTE — Progress Notes (Signed)
 Cardiology Office Note:    Date:  06/03/2023   ID:  Stephanie Fox, DOB [birth year], MRN [id number]  PCP:  Olin Bertin, MD  Cardiologist:  Wendie Hamburg, MD     Referring MD: Olin Bertin, MD   Chief Complaint: follow-up of palpitations   History of Present Illness:    Stephanie Fox is a 73 y.o. female with a history of coronary artery calcifications noted on prior CT but negative cardiac PET stress test in 12/2022,  palpitations with paroxysmal SVT noted on monitor in 10/2022,  hypertension, hyperlipidemia, type 2 diabetes mellitus, COPD, and morbid obesity who is followed by Dr. Alda Amas and presents today for follow-up of palpitations.   Patient was referred to Dr. Alda Amas in 10/2022 for further evaluation fo palpitations. Zio monitor and Echo were ordered for further evaluation. Monitor showed sinus rhythm with 385 episodes of paroxysmal SVT with the longest episode last 13 minutes as well as one 5 beat run of NSVT and occasional PACs. Echo showed LVEF of 60-65% with normal wall motion and grade 1 diastolic dysfunction. She was last seen by Dr. Alda Amas in 11/2022 at which time she was still having palpitations but they had improved some. She denied any chest pain but she did report shortness of breath with minimal exertion such as going up 1 flight of stairs. She was started on Metoprolol . Cardiac PET stress test was ordered to rule out her dyspnea as an anginal equivalent. Itmar sleep study was also ordered and she was referred to out lipid clinic for consideration of PCSK9 inhibitor given inability to tolerate statins. Cardiac PET was low risk with no evidence of ischemia.   She was last seen by me in 03/2023 at which time she continued to have an elevated heart rate with heart rate quickly rising to the 120s with minimal activity. However, she was no longer feeling the palpitations in her neck since starting the Metoprolol . Her bigger concern was fatigue and dyspnea with minimal  exertion which she think worsened with the Metoprolol . For this reason, Metoprolol  was stopped and she was started on Cardizem  instead. Her dyspnea was not felt to be cardiac in nature and deconditioning and obesity were felt to be playing a large role. Prior CT scan in 11/2022 was suggestive of underlying COPD and she was advised to follow-up with her PCP.    Patient presents today for follow-up. She still has some heart racing but states this has improved on the Cardizem . She thinks she is tolerating the Cardizem  better than the Metoprolol . She states heart rates are often in the 90s to low 100s at rest. No chest pain. She states breathing is doing okay. No orthopnea, PND, or edema. She describes some lightheadedness/ dizziness and near syncope in January and states she was found to have what sounds like iron deficiency. She was given iron supplements and has not had any recurrent symptoms. No syncope. She does describes some bilateral leg heaviness while walking in [place] over Christmas and had significant leg heaviness when walking up stairs.  EKGs/Labs/Other Studies Reviewed:    The following studies were reviewed:  Monitor 10/26/2022 to 11/02/2022: Patient had a min HR of 64 bpm, max HR of 203 bpm, and avg HR of 95 bpm. Predominant underlying rhythm was Sinus Rhythm. 1 run of Ventricular Tachycardia occurred lasting 5 beats with a max rate of 176 bpm (avg 160 bpm). 385 Supraventricular Tachycardia runs occurred, the run with the fastest interval lasting 12 mins 51 secs with  a max rate of 203 bpm (avg 167 bpm); the run with the fastest interval was also the longest. Some episodes of Supraventricular Tachycardia may be possible Atrial Tachycardia with variable block. Supraventricular Tachycardia was detected within +/- 45 seconds of symptomatic patient event(s). Isolated SVEs were occasional (2.0%, 19013), SVE Couplets were occasional (4.2%, 20024), and SVE Triplets were rare (<1.0%, 1114). Isolated VEs were  rare (<1.0%), and no VE Couplets or VE Triplets were present. Ventricular Bigeminy was present. Inverted QRS complexes possibly due to inverted placement of device. MD notification criteria for Supraventricular Tachycardia met - report posted prior to notification per account request (AG).     385 episodes of SVT, longest lasting 13 minutes with average rate 167 bpm   Occasional PACs (2%) and supraventricular couplets (4.2%)   1 episode of NSVT lasting 5 beats _______________   Echocardiogram 11/21/2022: Impressions:  1. Left ventricular ejection fraction, by estimation, is 60 to 65%. The  left ventricle has normal function. The left ventricle has no regional  wall motion abnormalities. Left ventricular diastolic parameters are  consistent with Grade I diastolic  dysfunction (impaired relaxation).   2. Right ventricular systolic function is normal. The right ventricular  size is normal. There is normal pulmonary artery systolic pressure. The  estimated right ventricular systolic pressure is 30.8 mmHg.   3. A small pericardial effusion is present. The pericardial effusion is  posterior to the left ventricle.   4. The mitral valve is normal in structure. No evidence of mitral valve  regurgitation. No evidence of mitral stenosis.   5. The aortic valve is normal in structure. Aortic valve regurgitation is  not visualized. No aortic stenosis is present.   6. The inferior vena cava is normal in size with greater than 50%  respiratory variability, suggesting right atrial pressure of 3 mmHg.   Comparison(s): Frequent PVCs are seen during the study.  atherosclerotic heart disease _______________   Cardiac PET Stress Test 12/27/2022:   The study is normal. The study is low risk.   LV perfusion is normal. There is no evidence of ischemia. There is no evidence of infarction.   Rest left ventricular function is normal. Rest EF: 65%. Stress left ventricular function is normal. Stress EF: 70%. End  diastolic cavity size is normal. End systolic cavity size is normal. No evidence of transient ischemic dilation (TID) noted.   Myocardial blood flow was computed to be 1.52ml/g/min at rest and 2.61ml/g/min at stress. Global myocardial blood flow reserve was 2.04 and was normal. High resting flows.   Coronary calcium was present on the attenuation correction CT images. Severe coronary calcifications were present. Coronary calcifications were present in the left anterior descending artery, left circumflex artery and right coronary artery distribution(s).  EKG:  EKG not ordered today.   Recent Labs: 12/10/2022: ALT 20; BUN 11; Creatinine, Ser 0.81; Magnesium  1.8; Potassium 4.6; Sodium 140; TSH 1.430  Recent Lipid Panel No results found for: "CHOL", "TRIG", "HDL", "CHOLHDL", "VLDL", "LDLCALC", "LDLDIRECT"  Physical Exam:    Vital Signs: BP 128/72   Pulse 94   Ht 5' 5.5" (1.664 m)   Wt 240 lb 3.2 oz (109 kg)   SpO2 98%   BMI 39.36 kg/m     Wt Readings from Last 3 Encounters:  06/03/23 240 lb 3.2 oz (109 kg)  04/02/23 236 lb (107 kg)  03/28/23 238 lb (108 kg)     General: 73 y.o. obese African-American female in no acute distress. HEENT: Normocephalic and atraumatic.  Sclera clear.  Neck: Supple. No carotid bruits. No JVD. Heart:  Borderline tachycardic with normal rhythm. Distinct S1 and S2. No murmurs, gallops, or rubs.  Lungs: No increased work of breathing. Clear to ausculation bilaterally. No wheezes, rhonchi, or rales.  Abdomen: Soft, non-distended, and non-tender to palpation.  Extremities: No lower extremity edema.  Distal pedal pulses 2+ and equal bilaterally.  Skin: Warm and dry. Neuro: No focal deficits. Psych: Normal affect. Responds appropriately.   Assessment:    1. Palpitations   2. Paroxysmal SVT (supraventricular tachycardia) (HCC)   3. Coronary artery calcification   4. Primary hypertension   5. Hyperlipidemia, unspecified hyperlipidemia type   6. Type 2  diabetes mellitus with obesity (HCC)   7. Pain in both lower extremities   8. Leg heaviness   9. Anemia, unspecified type   10. Pre-op evaluation     Plan:    Palpitations Paroxysmal SVT Patient has a history of palpitations with known paroxysmal SVT. Monitor in 10/2022 showed 385 episodes of paroxysmal SVT with the longest episode last 13 minutes as well as one 5 beat run of NSVT and occasional PACs. - She still has some occasional heart racing but overall improved. Resting heart rates are typically in the 90s to low 100s. - Will increase Cardizem  CD to 240mg  daily. - Itmar sleep study was ordered in 10/2022 but she has not completed this yet. Discussed importance of this.   Coronary Artery Calcifications She has had coronary artery calcifications noted on prior chest CT scans dating back to at least 2019. Recent cardiac PET stress test in 12/2022 showed severe coronary calcifications in the LAD, LCX, and RCA distributions but was negative for ischemia or infarction. - No chest pain.  - Continue Aspirin  81mg  daily.  - Intolerant to statins in the past. Continue Zetia. Please see information regarding Repatha  below.     Hypertension BP well controlled. - Continue Olmesartan 20mg  daily.  - Will increase Cardizem  CD to 240mg  daily for additional heart rate control.  Hyperlipidemia LDL 140 in 04/2022. - Intolerant to statins.  - Continue Zetia 10mg  daily. - She was seen in our [facility] in 11/2022 and Repatha  was recommended. However, she never started this. Per phone note from 11/26/2022, it looks like her prior authorization was approved through 05/29/2023 and copay would cost $47. Will follow-up with PharmD on this given we are past the approval date.    Type 2 Diabetes Mellitus - On Ozempic.  - Previously on Farxiga but this was stopped by Endocrinology due to UTI/ yeast infections.  - Management per PCP.  Leg Heaviness Patient describes leg heaviness when walking in [place] over  Christmas that was significantly worse when trying to go upstairs. - She has good distal pedal pulses on exam. However, symptoms do sound like possible claudication.  - Will check ABIs and lower extremity arterial ultrasounds.   Anemia Pre-Op Evaluation Patient was recently diagnosed with anemia with hemoglobin of 8.5 and was started on iron supplements. She has been referred for GI and sounds like she will likely need a colonoscopy/ EGD. She is stable from a cardiac standpoint and is able to complete >4.0 METs of physical activity without any angina or significant shortness of breath. Therefore, based on ACC/AHA guidelines, patient would be at acceptable risk for the planned procedure without further cardiovascular testing. Okay to hold Aspirin  for 5-7 days prior to this if necessary.     Disposition: Follow up in 6 months.  Signed, Casimer Clear, PA-C  06/03/2023 5:46 PM    [facility]

## 2023-05-30 DIAGNOSIS — D509 Iron deficiency anemia, unspecified: Secondary | ICD-10-CM | POA: Diagnosis not present

## 2023-06-03 ENCOUNTER — Encounter: Payer: Self-pay | Admitting: Student

## 2023-06-03 ENCOUNTER — Ambulatory Visit: Payer: PPO | Attending: Student | Admitting: Student

## 2023-06-03 VITALS — BP 128/72 | HR 94 | Ht 65.5 in | Wt 240.2 lb

## 2023-06-03 DIAGNOSIS — R29898 Other symptoms and signs involving the musculoskeletal system: Secondary | ICD-10-CM

## 2023-06-03 DIAGNOSIS — I10 Essential (primary) hypertension: Secondary | ICD-10-CM | POA: Diagnosis not present

## 2023-06-03 DIAGNOSIS — E785 Hyperlipidemia, unspecified: Secondary | ICD-10-CM

## 2023-06-03 DIAGNOSIS — D649 Anemia, unspecified: Secondary | ICD-10-CM

## 2023-06-03 DIAGNOSIS — I471 Supraventricular tachycardia, unspecified: Secondary | ICD-10-CM | POA: Diagnosis not present

## 2023-06-03 DIAGNOSIS — Z01818 Encounter for other preprocedural examination: Secondary | ICD-10-CM

## 2023-06-03 DIAGNOSIS — E669 Obesity, unspecified: Secondary | ICD-10-CM

## 2023-06-03 DIAGNOSIS — E1169 Type 2 diabetes mellitus with other specified complication: Secondary | ICD-10-CM

## 2023-06-03 DIAGNOSIS — I2510 Atherosclerotic heart disease of native coronary artery without angina pectoris: Secondary | ICD-10-CM

## 2023-06-03 DIAGNOSIS — R002 Palpitations: Secondary | ICD-10-CM

## 2023-06-03 DIAGNOSIS — M79604 Pain in right leg: Secondary | ICD-10-CM

## 2023-06-03 DIAGNOSIS — M79605 Pain in left leg: Secondary | ICD-10-CM

## 2023-06-03 MED ORDER — DILTIAZEM HCL ER COATED BEADS 240 MG PO CP24: 240.0000 mg | ORAL_CAPSULE | Freq: Every day | ORAL | 6 refills | Status: DC

## 2023-06-03 NOTE — Patient Instructions (Signed)
 Medication Instructions:  INCREASE DILTIAZEM  240MG  DAILY RE-START REPATHA  *If you need a refill on your cardiac medications before your next appointment, please call your pharmacy*  Lab Work: NONE If you have labs (blood work) drawn today and your tests are completely normal, you will receive your results only by:  MyChart Message (if you have MyChart) OR  A paper copy in the mail If you have any lab test that is abnormal or we need to change your treatment, we will call you to review the results.  Testing/Procedures: Your physician has requested that you have an ankle brachial index (ABI) and Lower extremity ultrasounds. During this test an ultrasound and blood pressure cuff are used to evaluate the arteries that supply the arms and legs with blood. Allow thirty minutes for this exam. There are no restrictions or special instructions.  Please note: We ask at that you not bring children with you during ultrasound (echo/ vascular) testing. Due to room size and safety concerns, children are not allowed in the ultrasound rooms during exams. Our front office staff cannot provide observation of children in our lobby area while testing is being conducted. An adult accompanying a patient to their appointment will only be allowed in the ultrasound room at the discretion of the ultrasound technician under special circumstances. We apologize for any inconvenience.  Follow-Up: At [facility], you and your health needs are our priority.  As part of our continuing mission to provide you with exceptional heart care, we have created designated Provider Care Teams.  These Care Teams include your primary Cardiologist (physician) and Advanced Practice Providers (APPs -  Physician Assistants and Nurse Practitioners) who all work together to provide you with the care you need, when you need it.  Your next appointment:   6 month(s)  Provider:   Wendie Hamburg, MD  or Callie Goodrich, PA-C

## 2023-06-25 NOTE — Telephone Encounter (Signed)
 We could decrease the Diltiazem but I worry she will have worsening palpitations with this and her heart rate will go up. Can we give her a call and get a little bit more information on the symptoms she is having before we make a decision? What does she mean she is feeling "loopy?" Is she having lightheadedness/ dizziness? Is this feeling associated with palpitations/ heart racing?  Thank you!

## 2023-06-26 NOTE — Telephone Encounter (Signed)
 Left message to call back. How is she feeling, lightheaded/loopy/dizziness?

## 2023-07-01 DIAGNOSIS — D509 Iron deficiency anemia, unspecified: Secondary | ICD-10-CM | POA: Diagnosis not present

## 2023-07-01 NOTE — Telephone Encounter (Signed)
 Called pt verified DOB, happy birthday last week to pt. Started to inform of message but pt got disconnected before finished relaying Marjie Skiff, PA-C's message. Called back, unable to relay message, will call again later.

## 2023-07-03 ENCOUNTER — Ambulatory Visit (HOSPITAL_COMMUNITY)
Admission: RE | Admit: 2023-07-03 | Discharge: 2023-07-03 | Disposition: A | Discharge status: Home / Self Care | Payer: PPO | Source: Ambulatory Visit | Attending: Student | Admitting: Student

## 2023-07-03 ENCOUNTER — Ambulatory Visit (HOSPITAL_BASED_OUTPATIENT_CLINIC_OR_DEPARTMENT_OTHER)
Admission: RE | Admit: 2023-07-03 | Discharge: 2023-07-03 | Disposition: A | Discharge status: Home / Self Care | Payer: PPO | Source: Ambulatory Visit | Attending: Student

## 2023-07-03 DIAGNOSIS — R29898 Other symptoms and signs involving the musculoskeletal system: Secondary | ICD-10-CM | POA: Diagnosis not present

## 2023-07-03 DIAGNOSIS — M79604 Pain in right leg: Secondary | ICD-10-CM | POA: Insufficient documentation

## 2023-07-03 DIAGNOSIS — M79605 Pain in left leg: Secondary | ICD-10-CM | POA: Diagnosis not present

## 2023-07-03 LAB — VAS US ABI WITH/WO TBI
Left ABI: 0.74
Right ABI: 0.75

## 2023-07-08 ENCOUNTER — Other Ambulatory Visit (HOSPITAL_COMMUNITY): Payer: Self-pay

## 2023-07-08 NOTE — Telephone Encounter (Signed)
 Left message to call back

## 2023-07-10 ENCOUNTER — Other Ambulatory Visit (HOSPITAL_COMMUNITY): Payer: Self-pay

## 2023-07-10 ENCOUNTER — Telehealth: Payer: Self-pay

## 2023-07-12 ENCOUNTER — Telehealth: Payer: Self-pay | Admitting: Cardiology

## 2023-07-12 ENCOUNTER — Telehealth: Payer: Self-pay | Admitting: Pharmacy Technician

## 2023-07-12 ENCOUNTER — Other Ambulatory Visit (HOSPITAL_COMMUNITY): Payer: Self-pay

## 2023-07-12 DIAGNOSIS — I739 Peripheral vascular disease, unspecified: Secondary | ICD-10-CM

## 2023-07-12 MED ORDER — REPATHA SURECLICK 140 MG/ML ~~LOC~~ SOAJ: 1.0000 mL | SUBCUTANEOUS | 1 refills | Status: DC

## 2023-07-12 NOTE — Telephone Encounter (Signed)
 Repatha sent to pharmacy and lab orders for repeat fasting lipid panel in 3 months placed

## 2023-07-12 NOTE — Telephone Encounter (Signed)
 Pharmacy Patient Advocate Encounter  Received notification from [facility] [employer] that Prior Authorization for repatha has been APPROVED from 07/12/23 to 01/08/24. Spoke to pharmacy to process.Copay is $47.00.     PA #/Case ID/Reference #: [id number]    I called the patient to let her know and she said she is good with 47.00

## 2023-07-12 NOTE — Telephone Encounter (Signed)
 Stephanie Parker, PA-C 07/07/2023  1:30 PM EDT     Please notify patient of results:  Lower extremity dopplers and ABIs showed moderate blockages in both legs and one vessel in the right leg looks to be totally occluded. Recommend referral to Dr. Allyson Sabal for PAD. Also can we follow-up with PharmD about Repatha. She was previously seen in the Lipid Clinic and Repatha was recommended but I don't think patient ever started this.   Thank you!    Patient identification verified by 2 forms. Stephanie Rail, RN    Called and spoke to patient  Relayed provider result message  Patient states:   -she does want Repatha   -previous approval was given for repatha   -no one ever sent her a prescription  Informed patient:  -referral placed for Dr. Allyson Sabal, she will be outreached for scheduling  -message sent to pharmacy regarding repatha  Patient verbalized understanding, no questions at this time

## 2023-07-12 NOTE — Telephone Encounter (Signed)
 Pt returning call in regards to results she would like a c/b on her house phone. Please advise

## 2023-07-12 NOTE — Telephone Encounter (Signed)
 Attempted to call patient, no answer left message requesting a call back.

## 2023-07-12 NOTE — Telephone Encounter (Signed)
 Tried to call pt about Repatha received message from: smart call blocker "number is not accepting calls from your number please hang up" called x2.

## 2023-07-15 NOTE — Telephone Encounter (Signed)
 Patient identification verified by 2 forms. Marilynn Rail, RN    Called and spoke to patient  Relayed message below  Patient states:   -she will be having a procedure for GI   -was told she can't take certain medication  Advised patient to have provider office fax pre-op clearance form to office  Informed patient mychart message sent with instructions  Patient verbalized understanding, no questions at this time

## 2023-07-16 NOTE — Telephone Encounter (Signed)
 CALLED PHARMACY PT PICKED UP RX [id number]

## 2023-07-23 ENCOUNTER — Encounter: Payer: Self-pay | Admitting: Cardiovascular Disease

## 2023-07-23 ENCOUNTER — Telehealth: Payer: Self-pay

## 2023-07-23 ENCOUNTER — Ambulatory Visit: Attending: Cardiovascular Disease | Admitting: Cardiovascular Disease

## 2023-07-23 VITALS — BP 130/80 | HR 94 | Ht 65.0 in | Wt 252.6 lb

## 2023-07-23 DIAGNOSIS — I739 Peripheral vascular disease, unspecified: Secondary | ICD-10-CM | POA: Diagnosis not present

## 2023-07-23 NOTE — Telephone Encounter (Signed)
**Note De-Identified Shanara Schnieders Obfuscation** Ordering provider: Dr Bjorn Pippin Associated diagnoses: Somnolence-R40.0 and HTN-I10  WatchPAT PA obtained on 07/23/2023 by Jerold Yoss, Lorelle Formosa, LPN. Authorization: I called [employer] and was advised by Alcario Drought that a PA is not required for CPT Code: 16109 (Itamar-HST).  Patient notified of PIN (1234) on 07/23/2023 Aliese Brannum Notification Method: in person while at a office visit with Dr Allyson Sabal today. Per the pts chart, she was given the WatchPAT One-HST device Pin # on 03/28/23 but she states that she did not understand how the WatchPat One-HST device worked. The pt was educated on how to use the device correctly by the nurse working with Dr Allyson Sabal today  Phone note routed to covering staff for follow-up.

## 2023-07-23 NOTE — Assessment & Plan Note (Signed)
 Stephanie Fox was referred to me by Marjie Skiff, PA-C for evaluation of PAD.  She is a cardiology patient of Dr. Campbell Lerner.  She has a history of remote tobacco abuse, treated hypertension, diabetes and hyperlipidemia.  She really denies claudication although she did have Doppler studies that suggested that she had ABIs of 0.75 on the right and left respectively with only an occluded right posterior tibial artery and otherwise mild nonobstructive disease.  I do not think she has any symptoms of claudication nor does she need any intervention at this time.

## 2023-07-23 NOTE — Telephone Encounter (Signed)
 Pt at office visit today with Dr. Allyson Sabal for consult regarding recent dopplers. Pt asks about Itamar sleep study that she received a few months ago. Pt states that she has not received a call with her code to active device. She states that she hasn't opened the box. Pt was not instructed to download the app while in the office at previous visit. Instructed pt on how to download app to her smart phone. While pt in the office Larita Fife, LPN was able to get authorization for itamar. Instruction sheet printed for pt and Larita Fife provided code to pt. Pt states she will wear Itamar in the next few nights. Pt has no further questions at this time.

## 2023-07-23 NOTE — Progress Notes (Signed)
 07/23/2023 Stephanie Fox   01-02-1951  [id number]  Primary Physician Mila Palmer, MD Primary Cardiologist: Runell Gess MD Nicholes Calamity, [place]  HPI:  Stephanie Fox is a 73 y.o. severely overweight single African-American female with no children who is retired from being an Insurance account manager at [employer].  She was referred by Marjie Skiff, PA-C for peripheral vascular evaluation.  Her cardiologist is Dr.Schumann.  Her risk factors include 25 pack years of tobacco abuse having quit on 06/22/2019.  She has treated hypertension, diabetes and hyperlipidemia.  There is no family history for heart disease.  She is never had a heart attack or stroke.  She denies chest pain or shortness of breath.  She has had a normal 2D echo and cardiac PET study in the past.  She had lower extremity arterial Doppler studies performed 07/03/2023 revealing ABIs and 0.75 range bilaterally with occluded right posterior tibial but otherwise mild nonobstructive disease.   Current Meds  Medication Sig   acetaminophen (TYLENOL) 500 MG tablet Take 500 mg by mouth every 6 (six) hours as needed.   Acetylcarnitine HCl (ACETYL L-CARNITINE PO) Take 2 tablets by mouth 2 (two) times daily.   Alpha-Lipoic Acid 300 MG CAPS Take 300 mg by mouth 2 (two) times daily.   Ascorbic Acid (VITAMIN C) 100 MG tablet Take 100 mg by mouth daily.   ASPIRIN 81 PO aspirin 81 mg tablet,delayed release  Take 1 tablet every day by oral route.   cetirizine (ZYRTEC) 10 MG tablet Take 10 mg by mouth daily as needed.   cholecalciferol (VITAMIN D3) 25 MCG (1000 UNIT) tablet Take 1,000 Units by mouth daily.   cyanocobalamin (VITAMIN B12) 1000 MCG tablet Take 1,000 mcg by mouth daily.   diltiazem (CARDIZEM CD) 240 MG 24 hr capsule Take 1 capsule (240 mg total) by mouth daily.   ezetimibe (ZETIA) 10 MG tablet Take 10 mg by mouth daily.   ferrous sulfate 325 (65 FE) MG tablet Take 325 mg by mouth 3 (three) times a week.   fluticasone (FLONASE) 50  MCG/ACT nasal spray Place into both nostrils daily.   montelukast (SINGULAIR) 10 MG tablet TAKE 1 TABLET BY MOUTH EVERYDAY AT BEDTIME   olmesartan (BENICAR) 20 MG tablet Take 20 mg by mouth daily.   Semaglutide, 1 MG/DOSE, (OZEMPIC, 1 MG/DOSE,) 4 MG/3ML SOPN Inject 1 mg into the skin once a week.   TURMERIC CURCUMIN PO Take 500 mg by mouth 3 (three) times daily.   vitamin E 400 UNIT capsule Take 400 Units by mouth 2 (two) times daily.      Allergies  Allergen Reactions   Molds & Smuts Hives   Elderberry Hives   Plegisol Hives and Itching    Social History   Socioeconomic History   Marital status: Single    Spouse name: Not on file   Number of children: Not on file   Years of education: Not on file   Highest education level: Not on file  Occupational History   Not on file  Tobacco Use   Smoking status: Former    Current packs/day: 0.00    Average packs/day: 0.7 packs/day for 49.0 years (34.3 ttl pk-yrs)    Types: Cigarettes    Start date: 06/22/1970    Quit date: 06/22/2019    Years since quitting: 4.0   Smokeless tobacco: Never  Substance and Sexual Activity   Alcohol use: Yes    Comment: rare   Drug use: No  Sexual activity: Not Currently    Birth control/protection: Post-menopausal  Other Topics Concern   Not on file  Social History Narrative   Not on file   Social Drivers of Health   Financial Resource Strain: Not on file  Food Insecurity: Not on file  Transportation Needs: Not on file  Physical Activity: Not on file  Stress: Not on file  Social Connections: Not on file  Intimate Partner Violence: Not on file     Review of Systems: General: negative for chills, fever, night sweats or weight changes.  Cardiovascular: negative for chest pain, dyspnea on exertion, edema, orthopnea, palpitations, paroxysmal nocturnal dyspnea or shortness of breath Dermatological: negative for rash Respiratory: negative for cough or wheezing Urologic: negative for  hematuria Abdominal: negative for nausea, vomiting, diarrhea, bright red blood per rectum, melena, or hematemesis Neurologic: negative for visual changes, syncope, or dizziness All other systems reviewed and are otherwise negative except as noted above.    Blood pressure 130/80, pulse 94, height 5\' 5"  (1.651 m), weight 252 lb 9.6 oz (114.6 kg), SpO2 99%.  General appearance: alert and no distress Neck: no adenopathy, no carotid bruit, no JVD, supple, symmetrical, trachea midline, and thyroid not enlarged, symmetric, no tenderness/mass/nodules Lungs: clear to auscultation bilaterally Heart: regular rate and rhythm, S1, S2 normal, no murmur, click, rub or gallop Extremities: extremities normal, atraumatic, no cyanosis or edema Pulses: 2+ and symmetric Skin: Skin color, texture, turgor normal. No rashes or lesions Neurologic: Grossly normal  EKG not performed today      ASSESSMENT AND PLAN:   Peripheral arterial disease (HCC) Stephanie Fox was referred to me by Marjie Skiff, PA-C for evaluation of PAD.  She is a cardiology patient of Dr. Campbell Lerner.  She has a history of remote tobacco abuse, treated hypertension, diabetes and hyperlipidemia.  She really denies claudication although she did have Doppler studies that suggested that she had ABIs of 0.75 on the right and left respectively with only an occluded right posterior tibial artery and otherwise mild nonobstructive disease.  I do not think she has any symptoms of claudication nor does she need any intervention at this time.     Runell Gess MD FACP,FACC,FAHA, [facility] 07/23/2023 2:34 PM

## 2023-07-23 NOTE — Patient Instructions (Signed)
 Medication Instructions:  Your physician recommends that you continue on your current medications as directed. Please refer to the Current Medication list given to you today.  *If you need a refill on your cardiac medications before your next appointment, please call your pharmacy*  Follow-Up: At [facility], you and your health needs are our priority.  As part of our continuing mission to provide you with exceptional heart care, our providers are all part of one team.  This team includes your primary Cardiologist (physician) and Advanced Practice Providers or APPs (Physician Assistants and Nurse Practitioners) who all work together to provide you with the care you need, when you need it.  Your next appointment:   We will see you on an as needed basis.  Provider:   Nanetta Batty, MD    We recommend signing up for the patient portal called "MyChart".  Sign up information is provided on this After Visit Summary.  MyChart is used to connect with patients for Virtual Visits (Telemedicine).  Patients are able to view lab/test results, encounter notes, upcoming appointments, etc.  Non-urgent messages can be sent to your provider as well.   To learn more about what you can do with MyChart, go to [email or web address].   Other Instructions       1st Floor: - Lobby - Registration  - Pharmacy  - Lab - Cafe  2nd Floor: - PV Lab - Diagnostic Testing (echo, CT, nuclear med)  3rd Floor: - Vacant  4th Floor: - TCTS (cardiothoracic surgery) - AFib Clinic - Structural Heart Clinic - Vascular Surgery  - Vascular Ultrasound  5th Floor: - HeartCare Cardiology (general and EP) - Clinical Pharmacy for coumadin, hypertension, lipid, weight-loss medications, and med management appointments    Valet parking services will be available as well.

## 2023-07-24 NOTE — Telephone Encounter (Signed)
 Called patient and patient verbalized she has no question about WatchPat study and will start study after her Golf tournament. Made patient aware to call office if she has any questions. Understanding verbalized.

## 2023-07-31 DIAGNOSIS — B9681 Helicobacter pylori [H. pylori] as the cause of diseases classified elsewhere: Secondary | ICD-10-CM | POA: Diagnosis not present

## 2023-07-31 DIAGNOSIS — Z860101 Personal history of adenomatous and serrated colon polyps: Secondary | ICD-10-CM | POA: Diagnosis not present

## 2023-07-31 DIAGNOSIS — K5730 Diverticulosis of large intestine without perforation or abscess without bleeding: Secondary | ICD-10-CM | POA: Diagnosis not present

## 2023-07-31 DIAGNOSIS — K2930 Chronic superficial gastritis without bleeding: Secondary | ICD-10-CM | POA: Diagnosis not present

## 2023-07-31 DIAGNOSIS — K2970 Gastritis, unspecified, without bleeding: Secondary | ICD-10-CM | POA: Diagnosis not present

## 2023-07-31 DIAGNOSIS — D125 Benign neoplasm of sigmoid colon: Secondary | ICD-10-CM | POA: Diagnosis not present

## 2023-07-31 DIAGNOSIS — D509 Iron deficiency anemia, unspecified: Secondary | ICD-10-CM | POA: Diagnosis not present

## 2023-07-31 DIAGNOSIS — K635 Polyp of colon: Secondary | ICD-10-CM | POA: Diagnosis not present

## 2023-07-31 DIAGNOSIS — Z09 Encounter for follow-up examination after completed treatment for conditions other than malignant neoplasm: Secondary | ICD-10-CM | POA: Diagnosis not present

## 2023-08-01 ENCOUNTER — Other Ambulatory Visit: Payer: Self-pay | Admitting: Pulmonary Disease

## 2023-08-01 DIAGNOSIS — I739 Peripheral vascular disease, unspecified: Secondary | ICD-10-CM | POA: Diagnosis not present

## 2023-08-02 DIAGNOSIS — K2930 Chronic superficial gastritis without bleeding: Secondary | ICD-10-CM | POA: Diagnosis not present

## 2023-08-02 DIAGNOSIS — D125 Benign neoplasm of sigmoid colon: Secondary | ICD-10-CM | POA: Diagnosis not present

## 2023-08-02 DIAGNOSIS — K635 Polyp of colon: Secondary | ICD-10-CM | POA: Diagnosis not present

## 2023-08-02 DIAGNOSIS — B9681 Helicobacter pylori [H. pylori] as the cause of diseases classified elsewhere: Secondary | ICD-10-CM | POA: Diagnosis not present

## 2023-08-02 LAB — LIPID PANEL
Chol/HDL Ratio: 3.2 ratio (ref 0.0–4.4)
Cholesterol, Total: 184 mg/dL (ref 100–199)
HDL: 58 mg/dL (ref >39)
LDL Chol Calc (NIH): 115 mg/dL — ABNORMAL HIGH (ref 0–99)
Triglycerides: 56 mg/dL (ref 0–149)
VLDL Cholesterol Cal: 11 mg/dL (ref 5–40)

## 2023-08-06 DIAGNOSIS — L239 Allergic contact dermatitis, unspecified cause: Secondary | ICD-10-CM | POA: Diagnosis not present

## 2023-08-07 DIAGNOSIS — I10 Essential (primary) hypertension: Secondary | ICD-10-CM | POA: Diagnosis not present

## 2023-08-07 DIAGNOSIS — E559 Vitamin D deficiency, unspecified: Secondary | ICD-10-CM | POA: Diagnosis not present

## 2023-08-07 DIAGNOSIS — R809 Proteinuria, unspecified: Secondary | ICD-10-CM | POA: Diagnosis not present

## 2023-08-07 DIAGNOSIS — D509 Iron deficiency anemia, unspecified: Secondary | ICD-10-CM | POA: Diagnosis not present

## 2023-08-07 DIAGNOSIS — E7800 Pure hypercholesterolemia, unspecified: Secondary | ICD-10-CM | POA: Diagnosis not present

## 2023-08-07 DIAGNOSIS — E1165 Type 2 diabetes mellitus with hyperglycemia: Secondary | ICD-10-CM | POA: Diagnosis not present

## 2023-08-09 ENCOUNTER — Encounter (INDEPENDENT_AMBULATORY_CARE_PROVIDER_SITE_OTHER): Payer: Self-pay | Admitting: Cardiology

## 2023-08-09 DIAGNOSIS — G4733 Obstructive sleep apnea (adult) (pediatric): Secondary | ICD-10-CM

## 2023-08-12 ENCOUNTER — Ambulatory Visit: Attending: Cardiology

## 2023-08-12 NOTE — Procedures (Signed)
 SLEEP STUDY REPORT Patient Information Study Date: 08/09/2023 Patient Name: Stephanie Fox Patient ID: [id number] Birth Date: 23-Jun-1950 Age: 73 Gender: Female BMI: 39.3 (W=244 lb, H=5' 6'') Stopbang: 4 Referring Physician: Carson Clara, MD  TEST DESCRIPTION: Home sleep apnea testing was completed using the WatchPat, a Type 1 device, utilizing  peripheral arterial tonometry (PAT), chest movement, actigraphy, pulse oximetry, pulse rate, body position and snore.  AHI was calculated with apnea and hypopnea using valid sleep time as the denominator. RDI includes apneas,  hypopneas, and RERAs. The data acquired and the scoring of sleep and all associated events were performed in  accordance with the recommended standards and specifications as outlined in the AASM Manual for the Scoring of  Sleep and Associated Events 2.2.0 (2015).   FINDINGS:   1. Mild Obstructive Sleep Apnea with AHI 5.6/hr.   2. No Central Sleep Apnea with pAHIc 0.3/hr.   3. Oxygen desaturations as low as 73%.   4. Moderate to Severe snoring was present. O2 sats were < 88% for 0.5 min.   5. Total sleep time was 8 hrs and 9 min.   6. 26.5% of total sleep time was spent in REM sleep.   7. Normal sleep onset latency at 21 min.   8. Shortened REM sleep onset latency at 73 min.   9. Total awakenings were 5.  10. Arrhythmia detection: Suggestive of possible brief atrial fibrillation lasting 39 seconds. This is not diagnostic and  further testing with outpatient telemetry monitoring is recommended.  DIAGNOSIS: Mild Obstructive Sleep Apnea (G47.33) Possible Atrial Fibrillation  RECOMMENDATIONS: 1. Clinical correlation of these findings is necessary. The decision to treat obstructive sleep apnea (OSA) is usually  based on the presence of apnea symptoms or the presence of associated medical conditions such as Hypertension,  Congestive Heart Failure, Atrial Fibrillation or Obesity. The most common symptoms of OSA are  snoring, gasping for  breath while sleeping, daytime sleepiness and fatigue.  2. Initiating apnea therapy is recommended given the presence of symptoms and/or associated conditions.  Recommend proceeding with one of the following:  a. Auto-CPAP therapy with a pressure range of 5-20cm H2O.  b. An oral appliance (OA) that can be obtained from certain dentists with expertise in sleep medicine. These are  primarily of use in non-obese patients with mild and moderate disease.  c. An ENT consultation which may be useful to look for specific causes of obstruction and possible treatment  options.  d. If patient is intolerant to PAP therapy, consider referral to ENT for evaluation for hypoglossal nerve stimulator.  3. Close follow-up is necessary to ensure success with CPAP or oral appliance therapy for maximum benefit . 4. A follow-up oximetry study on CPAP is recommended to assess the adequacy of therapy and determine the need  for supplemental oxygen or the potential need for Bi-level therapy. An arterial blood gas to determine the adequacy of  baseline ventilation and oxygenation should also be considered. 5. Healthy sleep recommendations include: adequate nightly sleep (normal 7-9 hrs/night), avoidance of caffeine after  noon and alcohol near bedtime, and maintaining a sleep environment that is cool, dark and quiet. 6. Weight loss for overweight patients is recommended. Even modest amounts of weight loss can significantly  improve the severity of sleep apnea. 7. Snoring recommendations include: weight loss where appropriate, side sleeping, and avoidance of alcohol before  bed. 8. Operation of motor vehicle should be avoided when sleepy.  Signature: Gaylyn Keas, MD; [facility]; Diplomat, American Board of  Sleep  Medicine Electronically Signed: 08/12/2023 12:56:02 PM

## 2023-08-14 ENCOUNTER — Telehealth: Payer: Self-pay | Admitting: *Deleted

## 2023-08-14 NOTE — Telephone Encounter (Signed)
-----   Message from Gaylyn Keas sent at 08/12/2023  1:10 PM EDT ----- Patient has very mild OSA - set up OV to discuss treatment options.

## 2023-08-14 NOTE — Telephone Encounter (Signed)
 The patient has been notified of the result and verbalized understanding.  All questions (if any) were answered. Gaylene Kays, CMA 08/14/2023 12:38 PM    Patient has declined to make an office visit to discuss treatment options at this time.

## 2023-08-21 ENCOUNTER — Inpatient Hospital Stay: Attending: Hematology & Oncology

## 2023-08-21 ENCOUNTER — Encounter: Payer: Self-pay | Admitting: Hematology & Oncology

## 2023-08-21 ENCOUNTER — Ambulatory Visit (HOSPITAL_BASED_OUTPATIENT_CLINIC_OR_DEPARTMENT_OTHER)
Admission: RE | Admit: 2023-08-21 | Discharge: 2023-08-21 | Disposition: A | Discharge status: Home / Self Care | Source: Ambulatory Visit | Attending: Hematology & Oncology | Admitting: Hematology & Oncology

## 2023-08-21 ENCOUNTER — Inpatient Hospital Stay: Admitting: Hematology & Oncology

## 2023-08-21 VITALS — BP 131/85 | HR 86 | Temp 98.2°F | Resp 17 | Ht 66.5 in | Wt 260.0 lb

## 2023-08-21 DIAGNOSIS — I10 Essential (primary) hypertension: Secondary | ICD-10-CM | POA: Insufficient documentation

## 2023-08-21 DIAGNOSIS — M5135 Other intervertebral disc degeneration, thoracolumbar region: Secondary | ICD-10-CM | POA: Diagnosis not present

## 2023-08-21 DIAGNOSIS — Z79899 Other long term (current) drug therapy: Secondary | ICD-10-CM | POA: Insufficient documentation

## 2023-08-21 DIAGNOSIS — E509 Vitamin A deficiency, unspecified: Secondary | ICD-10-CM | POA: Insufficient documentation

## 2023-08-21 DIAGNOSIS — D808 Other immunodeficiencies with predominantly antibody defects: Secondary | ICD-10-CM | POA: Diagnosis not present

## 2023-08-21 DIAGNOSIS — D8989 Other specified disorders involving the immune mechanism, not elsewhere classified: Secondary | ICD-10-CM

## 2023-08-21 DIAGNOSIS — M51369 Other intervertebral disc degeneration, lumbar region without mention of lumbar back pain or lower extremity pain: Secondary | ICD-10-CM | POA: Diagnosis not present

## 2023-08-21 DIAGNOSIS — E119 Type 2 diabetes mellitus without complications: Secondary | ICD-10-CM | POA: Insufficient documentation

## 2023-08-21 DIAGNOSIS — Z7985 Long-term (current) use of injectable non-insulin antidiabetic drugs: Secondary | ICD-10-CM | POA: Diagnosis not present

## 2023-08-21 DIAGNOSIS — Z7982 Long term (current) use of aspirin: Secondary | ICD-10-CM | POA: Insufficient documentation

## 2023-08-21 DIAGNOSIS — Z87891 Personal history of nicotine dependence: Secondary | ICD-10-CM | POA: Insufficient documentation

## 2023-08-21 DIAGNOSIS — J449 Chronic obstructive pulmonary disease, unspecified: Secondary | ICD-10-CM | POA: Insufficient documentation

## 2023-08-21 DIAGNOSIS — K219 Gastro-esophageal reflux disease without esophagitis: Secondary | ICD-10-CM | POA: Diagnosis not present

## 2023-08-21 DIAGNOSIS — E785 Hyperlipidemia, unspecified: Secondary | ICD-10-CM | POA: Insufficient documentation

## 2023-08-21 DIAGNOSIS — D509 Iron deficiency anemia, unspecified: Secondary | ICD-10-CM | POA: Diagnosis not present

## 2023-08-21 DIAGNOSIS — R0602 Shortness of breath: Secondary | ICD-10-CM | POA: Insufficient documentation

## 2023-08-21 DIAGNOSIS — Z87442 Personal history of urinary calculi: Secondary | ICD-10-CM | POA: Diagnosis not present

## 2023-08-21 DIAGNOSIS — M51379 Other intervertebral disc degeneration, lumbosacral region without mention of lumbar back pain or lower extremity pain: Secondary | ICD-10-CM | POA: Diagnosis not present

## 2023-08-21 LAB — CBC WITH DIFFERENTIAL (CANCER CENTER ONLY)
Abs Immature Granulocytes: 0.01 10*3/uL (ref 0.00–0.07)
Basophils Absolute: 0.1 10*3/uL (ref 0.0–0.1)
Basophils Relative: 1 %
Eosinophils Absolute: 0.1 10*3/uL (ref 0.0–0.5)
Eosinophils Relative: 2 %
HCT: 39.2 % (ref 36.0–46.0)
Hemoglobin: 12.6 g/dL (ref 12.0–15.0)
Immature Granulocytes: 0 %
Lymphocytes Relative: 25 %
Lymphs Abs: 1.8 10*3/uL (ref 0.7–4.0)
MCH: 28.6 pg (ref 26.0–34.0)
MCHC: 32.1 g/dL (ref 30.0–36.0)
MCV: 89.1 fL (ref 80.0–100.0)
Monocytes Absolute: 0.8 10*3/uL (ref 0.1–1.0)
Monocytes Relative: 11 %
Neutro Abs: 4.4 10*3/uL (ref 1.7–7.7)
Neutrophils Relative %: 61 %
Platelet Count: 279 10*3/uL (ref 150–400)
RBC: 4.4 MIL/uL (ref 3.87–5.11)
RDW: 18.5 % — ABNORMAL HIGH (ref 11.5–15.5)
WBC Count: 7.2 10*3/uL (ref 4.0–10.5)
nRBC: 0 % (ref 0.0–0.2)

## 2023-08-21 LAB — CMP (CANCER CENTER ONLY)
ALT: 15 U/L (ref 0–44)
AST: 15 U/L (ref 15–41)
Albumin: 4 g/dL (ref 3.5–5.0)
Alkaline Phosphatase: 92 U/L (ref 38–126)
Anion gap: 6 (ref 5–15)
BUN: 16 mg/dL (ref 8–23)
CO2: 30 mmol/L (ref 22–32)
Calcium: 10.3 mg/dL (ref 8.9–10.3)
Chloride: 104 mmol/L (ref 98–111)
Creatinine: 1.19 mg/dL — ABNORMAL HIGH (ref 0.44–1.00)
GFR, Estimated: 49 mL/min — ABNORMAL LOW (ref >60)
Glucose, Bld: 160 mg/dL — ABNORMAL HIGH (ref 70–99)
Potassium: 4.2 mmol/L (ref 3.5–5.1)
Sodium: 140 mmol/L (ref 135–145)
Total Bilirubin: 0.3 mg/dL (ref 0.0–1.2)
Total Protein: 7.9 g/dL (ref 6.5–8.1)

## 2023-08-21 LAB — LACTATE DEHYDROGENASE: LDH: 183 U/L (ref 98–192)

## 2023-08-21 LAB — SAVE SMEAR(SSMR), FOR PROVIDER SLIDE REVIEW

## 2023-08-21 NOTE — Progress Notes (Signed)
 Referral MD  Reason for Referral: Hypercalcemia and elevated kappa light chain  Chief Complaint  Patient presents with   New Patient (Initial Visit)  : I thought I was here because of internal bleeding.  HPI: Stephanie Fox is a very nice 73 year old Afro-American female.  She is from [place].  She has been down in [place]  for about 50 years.  She is retired from the post office.  She has diabetes.  She has had diabetes for about 14 years.  She apparently has some GI bleeding.  She had iron deficiency.  She saw Dr. Honey Lusty of Gastroenterology.  He did a endoscopy on her.  From what she says, the endoscopy did not show any abnormality.  Again, she was brought to the [facility] because of the fact that she apparently had hypercalcemia and elevated kappa light chain.  Her referring doctor did not see any labs with her so I am not sure exactly what was going on or how high.  She has had no change in bowel or bladder habits.  She has had no cough.  She gets her mammograms yearly..  She used to smoke.  She has about a 50-pack-year history of tobacco use.  She has stopped I think about 11 years ago.  She has had COVID.  Patient has had multiple surgeries in the past.  Again, I do not know exactly how high the calcium was or what her light chains were.  She has not had any weight loss.  There is been no bony pain.  Overall, I would have to say that her performance status is probably ECOG 0.    Past Medical History:  Diagnosis Date   Anal or rectal pain    per pt / had 3 times  in last 3 years   Anemia    as a child    Arthritis    COPD (chronic obstructive pulmonary disease) (HCC)    early   DDD (degenerative disc disease), lumbosacral    l4-5, S1   Diabetes mellitus (HCC)    type II    Dyspnea    with exertion    Edema    lower extremities    GERD (gastroesophageal reflux disease)    in past   History of kidney stones    Hyperlipidemia     Hypertension    Low back pain    Spinal stenosis   :   Past Surgical History:  Procedure Laterality Date   BREAST BIOPSY Left 01/2022   BREAST REDUCTION SURGERY  12/2002   Bil   DILATATION & CURETTAGE/HYSTEROSCOPY WITH MYOSURE N/A 10/21/2019   Procedure: DILATATION & CURETTAGE/HYSTEROSCOPY WITH MYOSURE;  Surgeon: Tresia Fruit, MD;  Location: [facility];  Service: Gynecology;  Laterality: N/A;   JOINT REPLACEMENT Bilateral 2017   KNEE SURGERY     LUMBAR EPIDURAL INJECTION     In Dec 2018,had 4 injection   REDUCTION MAMMAPLASTY Bilateral 15 years ago   thumb surgery  1990   right thumb   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   Procedure: BILATERAL TOTAL KNEE ARTHROPLASTY;  Surgeon: Claiborne Crew, MD;  Location: [facility];  Service: Orthopedics;  Laterality: Bilateral;  Adductor Block   underarm surgery  1980's   gland under right arm got infected due to shaving  :   Current Outpatient Medications:    acetaminophen  (TYLENOL ) 500 MG tablet, Take 500 mg by mouth every 6 (six) hours as needed., Disp: , Rfl:  Acetylcarnitine HCl (ACETYL L-CARNITINE PO), Take 2 tablets by mouth 2 (two) times daily., Disp: , Rfl:    Alpha-Lipoic Acid 300 MG CAPS, Take 300 mg by mouth 2 (two) times daily., Disp: , Rfl:    Ascorbic Acid (VITAMIN C) 100 MG tablet, Take 100 mg by mouth daily., Disp: , Rfl:    ASPIRIN  81 PO, aspirin  81 mg tablet,delayed release  Take 1 tablet every day by oral route., Disp: , Rfl:    cetirizine (ZYRTEC) 10 MG tablet, Take 10 mg by mouth daily as needed., Disp: , Rfl:    cholecalciferol  (VITAMIN D3) 25 MCG (1000 UNIT) tablet, Take 1,000 Units by mouth daily., Disp: , Rfl:    cyanocobalamin  (VITAMIN B12) 1000 MCG tablet, Take 1,000 mcg by mouth daily., Disp: , Rfl:    diltiazem  (CARDIZEM  CD) 180 MG 24 hr capsule, Take 180 mg by mouth daily., Disp: , Rfl:    Evolocumab  (REPATHA  SURECLICK) 140 MG/ML SOAJ, Inject 140 mg into the skin every 14 (fourteen)  days., Disp: 6 mL, Rfl: 1   ezetimibe (ZETIA) 10 MG tablet, Take 10 mg by mouth daily., Disp: , Rfl: 6   fluticasone (FLONASE) 50 MCG/ACT nasal spray, Place 1 spray into both nostrils daily. As needed, Disp: , Rfl:    montelukast  (SINGULAIR ) 10 MG tablet, TAKE 1 TABLET BY MOUTH EVERYDAY AT BEDTIME (Patient taking differently: As needed), Disp: 90 tablet, Rfl: 3   olmesartan (BENICAR) 20 MG tablet, Take 20 mg by mouth daily., Disp: , Rfl:    Semaglutide, 1 MG/DOSE, (OZEMPIC, 1 MG/DOSE,) 4 MG/3ML SOPN, Inject 1 mg into the skin once a week., Disp: , Rfl:    TURMERIC CURCUMIN PO, Take 500 mg by mouth 3 (three) times daily., Disp: , Rfl:    vitamin E 400 UNIT capsule, Take 400 Units by mouth 2 (two) times daily. , Disp: , Rfl:    cefUROXime  (CEFTIN ) 500 MG tablet, Take 1 tablet (500 mg total) by mouth 2 (two) times daily with a meal. (Patient not taking: Reported on 08/21/2023), Disp: 14 tablet, Rfl: 0   diltiazem  (CARDIZEM  CD) 240 MG 24 hr capsule, Take 1 capsule (240 mg total) by mouth daily. (Patient not taking: Reported on 08/21/2023), Disp: 30 capsule, Rfl: 6   ferrous sulfate  325 (65 FE) MG tablet, Take 325 mg by mouth 3 (three) times a week. (Patient not taking: Reported on 08/21/2023), Disp: , Rfl: :  :   Allergies  Allergen Reactions   Molds & Smuts Hives   Elderberry Hives   Plegisol Hives and Itching  :   Family History  Problem Relation Age of Onset   Colon cancer Neg Hx   :   Social History   Socioeconomic History   Marital status: Single    Spouse name: Not on file   Number of children: Not on file   Years of education: Not on file   Highest education level: Not on file  Occupational History   Not on file  Tobacco Use   Smoking status: Former    Current packs/day: 0.00    Average packs/day: 0.7 packs/day for 49.0 years (34.3 ttl pk-yrs)    Types: Cigarettes    Start date: 06/22/1970    Quit date: 06/22/2019    Years since quitting: 4.1   Smokeless tobacco: Never   Substance and Sexual Activity   Alcohol use: Yes    Comment: rare   Drug use: No   Sexual activity: Not Currently  Birth control/protection: Post-menopausal  Other Topics Concern   Not on file  Social History Narrative   Not on file   Social Drivers of Health   Financial Resource Strain: Low Risk  (08/21/2023)   Overall Financial Resource Strain (CARDIA)    Difficulty of Paying Living Expenses: Not hard at all  Food Insecurity: No Food Insecurity (08/21/2023)   Hunger Vital Sign    Worried About Running Out of Food in the Last Year: Never true    Ran Out of Food in the Last Year: Never true  Transportation Needs: No Transportation Needs (08/21/2023)   PRAPARE - Administrator, Civil Service (Medical): No    Lack of Transportation (Non-Medical): No  Physical Activity: Inactive (08/21/2023)   Exercise Vital Sign    Days of Exercise per Week: 0 days    Minutes of Exercise per Session: 0 min  Stress: No Stress Concern Present (08/21/2023)   [employer] of Occupational Health - Occupational Stress Questionnaire    Feeling of Stress : Not at all  Social Connections: Socially Isolated (08/21/2023)   Social Connection and Isolation Panel [NHANES]    Frequency of Communication with Friends and Family: More than three times a week    Frequency of Social Gatherings with Friends and Family: More than three times a week    Attends Religious Services: Never    Database administrator or Organizations: No    Attends Banker Meetings: Never    Marital Status: Never married  Intimate Partner Violence: Not At Risk (08/21/2023)   Humiliation, Afraid, Rape, and Kick questionnaire    Fear of Current or Ex-Partner: No    Emotionally Abused: No    Physically Abused: No    Sexually Abused: No  :  Review of Systems  Constitutional: Negative.   HENT: Negative.    Eyes: Negative.   Respiratory: Negative.    Cardiovascular: Negative.   Gastrointestinal: Negative.    Genitourinary: Negative.   Musculoskeletal: Negative.   Skin: Negative.   Neurological: Negative.   Endo/Heme/Allergies: Negative.   Psychiatric/Behavioral: Negative.       Exam:  Vital signs show temperature of 98.2.  Pulse 86.  Blood pressure 131/85.  Weight is 260 pounds.  @IPVITALS @ Physical Exam Vitals reviewed.  HENT:     Head: Normocephalic and atraumatic.  Eyes:     Pupils: Pupils are equal, round, and reactive to light.  Cardiovascular:     Rate and Rhythm: Normal rate and regular rhythm.     Heart sounds: Normal heart sounds.  Pulmonary:     Effort: Pulmonary effort is normal.     Breath sounds: Normal breath sounds.  Abdominal:     General: Bowel sounds are normal.     Palpations: Abdomen is soft.  Musculoskeletal:        General: No tenderness or deformity. Normal range of motion.     Cervical back: Normal range of motion.  Lymphadenopathy:     Cervical: No cervical adenopathy.  Skin:    General: Skin is warm and dry.     Findings: No erythema or rash.  Neurological:     Mental Status: She is alert and oriented to person, place, and time.  Psychiatric:        Behavior: Behavior normal.        Thought Content: Thought content normal.        Judgment: Judgment normal.     Recent Labs    08/21/23 1404  WBC 7.2  HGB 12.6  HCT 39.2  PLT 279    Recent Labs    08/21/23 1404  NA 140  K 4.2  CL 104  CO2 30  GLUCOSE 160*  BUN 16  CREATININE 1.19*  CALCIUM 10.3    Blood smear review: Normochromic and normocytic population of red blood cells.  There are no nucleated red blood cells.  I see no rouleaux formation.  White blood cells appear normal in morphology and maturation.  There is no plasma cells.  I see no immature myeloid or lymphoid cells.  Platelets are adequate in number and size.  Pathology: None    Assessment and Plan: Ms. Baros is a very charming 73 year old Afro-American female.  I think the real question is whether or not she has  a plasmacytic disorder.  We will go ahead and get a bone survey on her.  We will see if there is shows any myelomatous bone lesions.  We will have to see what her serum studies look like.  However, I think the key is going to be her 24-hour urine.  I told her to make sure that she drinks as much water  as possible so that she gives us  the most urine as possible.  No more urine that we have the more accurate the result is.  By her labs so far, I do not see any issues.  She certainly has no problems with the calcium.  Again I do not know why her calcium would have been high.  On her physical exam, I cannot find anything that is unusual.  She gets her mammograms routinely.  I would not think that breast cancer would be a cause for the hypercalcemia.  I do not think she is a bone marrow test.  I think this would be a test of last resort.  We will have to see what her 24-hour urine shows.  We will have to see what her serum studies show.  We will have to see what her bone survey shows.  I am not sure if we have to get her back.  We will await the results of her labs before making any follow-up appointment.  Again I love her faith.  We had a very good prayer at the end of our meeting.

## 2023-08-22 LAB — IGG, IGA, IGM
IgA: 385 mg/dL (ref 64–422)
IgG (Immunoglobin G), Serum: 2024 mg/dL — ABNORMAL HIGH (ref 586–1602)
IgM (Immunoglobulin M), Srm: 62 mg/dL (ref 26–217)

## 2023-08-22 LAB — KAPPA/LAMBDA LIGHT CHAINS
Kappa free light chain: 35.1 mg/L — ABNORMAL HIGH (ref 3.3–19.4)
Kappa, lambda light chain ratio: 1.33 (ref 0.26–1.65)
Lambda free light chains: 26.4 mg/L — ABNORMAL HIGH (ref 5.7–26.3)

## 2023-08-22 LAB — BETA 2 MICROGLOBULIN, SERUM: Beta-2 Microglobulin: 1.7 mg/L (ref 0.6–2.4)

## 2023-09-04 ENCOUNTER — Inpatient Hospital Stay: Attending: Hematology & Oncology

## 2023-09-04 ENCOUNTER — Ambulatory Visit: Payer: Self-pay | Admitting: Hematology & Oncology

## 2023-09-04 DIAGNOSIS — D8989 Other specified disorders involving the immune mechanism, not elsewhere classified: Secondary | ICD-10-CM

## 2023-09-04 NOTE — Telephone Encounter (Signed)
 Advised via MyChart.

## 2023-09-04 NOTE — Telephone Encounter (Signed)
-----   Message from Ivor Mars sent at 09/04/2023  9:11 AM EDT ----- Please call and let her know that the bone x-rays look fine.  No obvious cancer in the bones.  Twilla Galea

## 2023-09-09 LAB — UPEP/UIFE/LIGHT CHAINS/TP, 24-HR UR
% BETA, Urine: 20.4 %
ALPHA 1 URINE: 1.5 %
Albumin, U: 34.7 %
Alpha 2, Urine: 10.4 %
Free Kappa Lt Chains,Ur: 110.09 mg/L — ABNORMAL HIGH (ref 1.17–86.46)
Free Kappa/Lambda Ratio: 3.77 (ref 1.83–14.26)
Free Lambda Lt Chains,Ur: 29.19 mg/L — ABNORMAL HIGH (ref 0.27–15.21)
GAMMA GLOBULIN URINE: 33 %
Total Protein, Urine-Ur/day: 213 mg/(24.h) — ABNORMAL HIGH (ref 30–150)
Total Protein, Urine: 15.2 mg/dL
Total Volume: 1400

## 2023-09-13 DIAGNOSIS — E1142 Type 2 diabetes mellitus with diabetic polyneuropathy: Secondary | ICD-10-CM | POA: Diagnosis not present

## 2023-09-13 DIAGNOSIS — J309 Allergic rhinitis, unspecified: Secondary | ICD-10-CM | POA: Diagnosis not present

## 2023-10-14 ENCOUNTER — Other Ambulatory Visit: Payer: Self-pay | Admitting: Pharmacist

## 2023-10-14 ENCOUNTER — Telehealth: Payer: Self-pay | Admitting: Cardiology

## 2023-10-14 DIAGNOSIS — E785 Hyperlipidemia, unspecified: Secondary | ICD-10-CM

## 2023-10-14 NOTE — Telephone Encounter (Signed)
 Labs ordered. Spoke with patient and gave her directions to the new office.

## 2023-10-14 NOTE — Telephone Encounter (Signed)
 Pt called in stating she has on her calendar that she is supposed to have fasting lab tomorrow. I don't see an order for anything. Please advise if she needs to go or not.

## 2023-10-14 NOTE — Telephone Encounter (Signed)
 Will forward this call to our lipid clinic who manages pts lipids/repatha , for further management and follow-up with the pt.

## 2023-10-15 DIAGNOSIS — E785 Hyperlipidemia, unspecified: Secondary | ICD-10-CM | POA: Diagnosis not present

## 2023-10-16 ENCOUNTER — Ambulatory Visit: Payer: Self-pay | Admitting: Cardiology

## 2023-10-16 LAB — LIPID PANEL
Chol/HDL Ratio: 2.2 ratio (ref 0.0–4.4)
Cholesterol, Total: 141 mg/dL (ref 100–199)
HDL: 65 mg/dL (ref >39)
LDL Chol Calc (NIH): 58 mg/dL (ref 0–99)
Triglycerides: 96 mg/dL (ref 0–149)
VLDL Cholesterol Cal: 18 mg/dL (ref 5–40)

## 2023-11-05 DIAGNOSIS — B9681 Helicobacter pylori [H. pylori] as the cause of diseases classified elsewhere: Secondary | ICD-10-CM | POA: Diagnosis not present

## 2023-11-07 DIAGNOSIS — E1165 Type 2 diabetes mellitus with hyperglycemia: Secondary | ICD-10-CM | POA: Diagnosis not present

## 2023-11-07 DIAGNOSIS — R809 Proteinuria, unspecified: Secondary | ICD-10-CM | POA: Diagnosis not present

## 2023-11-07 DIAGNOSIS — D500 Iron deficiency anemia secondary to blood loss (chronic): Secondary | ICD-10-CM | POA: Diagnosis not present

## 2023-11-07 DIAGNOSIS — Z87891 Personal history of nicotine dependence: Secondary | ICD-10-CM | POA: Diagnosis not present

## 2023-11-07 DIAGNOSIS — I700 Atherosclerosis of aorta: Secondary | ICD-10-CM | POA: Diagnosis not present

## 2023-11-07 DIAGNOSIS — I10 Essential (primary) hypertension: Secondary | ICD-10-CM | POA: Diagnosis not present

## 2023-11-07 DIAGNOSIS — I2510 Atherosclerotic heart disease of native coronary artery without angina pectoris: Secondary | ICD-10-CM | POA: Diagnosis not present

## 2023-11-12 ENCOUNTER — Telehealth: Payer: Self-pay | Admitting: *Deleted

## 2023-11-12 DIAGNOSIS — H2513 Age-related nuclear cataract, bilateral: Secondary | ICD-10-CM | POA: Diagnosis not present

## 2023-11-12 DIAGNOSIS — H40003 Preglaucoma, unspecified, bilateral: Secondary | ICD-10-CM | POA: Diagnosis not present

## 2023-11-12 NOTE — Telephone Encounter (Signed)
 Patient is due for annual LDCT on 11/28/2023. Called and left VM for pt.

## 2023-11-12 NOTE — Telephone Encounter (Signed)
 Copied from CRM [id number]. Topic: Clinical - Request for Lab/Test Order >> Nov 11, 2023  4:19 PM Isabell A wrote: Reason for CRM: Patient states she usually gets a lung cancer screening every year & would like to confirm if she still needs to have this completed.  Callback number: [phone]

## 2023-11-15 ENCOUNTER — Other Ambulatory Visit: Payer: Self-pay | Admitting: Cardiology

## 2023-11-15 DIAGNOSIS — I739 Peripheral vascular disease, unspecified: Secondary | ICD-10-CM

## 2023-11-21 ENCOUNTER — Inpatient Hospital Stay: Attending: Hematology & Oncology

## 2023-11-21 ENCOUNTER — Inpatient Hospital Stay (HOSPITAL_BASED_OUTPATIENT_CLINIC_OR_DEPARTMENT_OTHER): Admitting: Hematology & Oncology

## 2023-11-21 ENCOUNTER — Encounter: Payer: Self-pay | Admitting: Hematology & Oncology

## 2023-11-21 ENCOUNTER — Other Ambulatory Visit: Payer: Self-pay

## 2023-11-21 ENCOUNTER — Other Ambulatory Visit: Payer: Self-pay | Admitting: *Deleted

## 2023-11-21 VITALS — BP 128/79 | HR 89 | Temp 98.5°F | Resp 18 | Ht 66.5 in | Wt 263.0 lb

## 2023-11-21 DIAGNOSIS — D8989 Other specified disorders involving the immune mechanism, not elsewhere classified: Secondary | ICD-10-CM

## 2023-11-21 DIAGNOSIS — Z79899 Other long term (current) drug therapy: Secondary | ICD-10-CM | POA: Diagnosis not present

## 2023-11-21 DIAGNOSIS — D472 Monoclonal gammopathy: Secondary | ICD-10-CM

## 2023-11-21 DIAGNOSIS — Z7982 Long term (current) use of aspirin: Secondary | ICD-10-CM | POA: Diagnosis not present

## 2023-11-21 DIAGNOSIS — Z79624 Long term (current) use of inhibitors of nucleotide synthesis: Secondary | ICD-10-CM | POA: Diagnosis not present

## 2023-11-21 LAB — COMPREHENSIVE METABOLIC PANEL WITH GFR
ALT: 23 U/L (ref 0–44)
AST: 25 U/L (ref 15–41)
Albumin: 4.2 g/dL (ref 3.5–5.0)
Alkaline Phosphatase: 118 U/L (ref 38–126)
Anion gap: 10 (ref 5–15)
BUN: 11 mg/dL (ref 8–23)
CO2: 26 mmol/L (ref 22–32)
Calcium: 10.2 mg/dL (ref 8.9–10.3)
Chloride: 103 mmol/L (ref 98–111)
Creatinine, Ser: 0.84 mg/dL (ref 0.44–1.00)
GFR, Estimated: >60 mL/min (ref >60)
Glucose, Bld: 133 mg/dL — ABNORMAL HIGH (ref 70–99)
Potassium: 4.1 mmol/L (ref 3.5–5.1)
Sodium: 140 mmol/L (ref 135–145)
Total Bilirubin: 0.4 mg/dL (ref 0.0–1.2)
Total Protein: 8.1 g/dL (ref 6.5–8.1)

## 2023-11-21 LAB — CBC WITH DIFFERENTIAL (CANCER CENTER ONLY)
Abs Immature Granulocytes: 0.03 K/uL (ref 0.00–0.07)
Basophils Absolute: 0.1 K/uL (ref 0.0–0.1)
Basophils Relative: 1 %
Eosinophils Absolute: 0.1 K/uL (ref 0.0–0.5)
Eosinophils Relative: 2 %
HCT: 40.7 % (ref 36.0–46.0)
Hemoglobin: 13 g/dL (ref 12.0–15.0)
Immature Granulocytes: 0 %
Lymphocytes Relative: 31 %
Lymphs Abs: 2.2 K/uL (ref 0.7–4.0)
MCH: 30.2 pg (ref 26.0–34.0)
MCHC: 31.9 g/dL (ref 30.0–36.0)
MCV: 94.4 fL (ref 80.0–100.0)
Monocytes Absolute: 0.9 K/uL (ref 0.1–1.0)
Monocytes Relative: 12 %
Neutro Abs: 3.9 K/uL (ref 1.7–7.7)
Neutrophils Relative %: 54 %
Platelet Count: 249 K/uL (ref 150–400)
RBC: 4.31 MIL/uL (ref 3.87–5.11)
RDW: 14.9 % (ref 11.5–15.5)
WBC Count: 7.2 K/uL (ref 4.0–10.5)
nRBC: 0 % (ref 0.0–0.2)

## 2023-11-21 LAB — LACTATE DEHYDROGENASE: LDH: 201 U/L — ABNORMAL HIGH (ref 98–192)

## 2023-11-21 NOTE — Progress Notes (Signed)
 Hematology and Oncology Follow Up Visit  Stephanie Fox [id number] 07/25/1950 73 y.o. 11/21/2023   Principle Diagnosis:  IgG kappa MGUS  Current Therapy:   Observation     Interim History:  Ms. Laske is back for second office visit.  Refers saw her on 08/21/2023.  At that time, I felt that what ever monoclonal spike that she had was probably more reactive to chronic inflammatory conditions.  We first saw her, there is no obvious monoclonal spike in her blood.  There is no monoclonal spike in her 24-hour urine.  She had normal bone survey.  She had relatively normal immunoglobulins.  The IgG level was slightly elevated at 2024 mg/dL.  Her kappa light chain was 3.5 mg/dL.  She has had no problem with infections.  She has had no problem with nausea or vomiting.  There is been no change in bowel or bladder habits.  She has had no rashes.  There is been no bleeding.  There has been no headache.  She has had no mouth sores.  There has been no visual changes.  Overall, I would have to say that her performance status is probably ECOG 0.  Medications:  Current Outpatient Medications:    valACYclovir (VALTREX) 500 MG tablet, Take 500 mg by mouth daily., Disp: , Rfl:    acetaminophen  (TYLENOL ) 500 MG tablet, Take 500 mg by mouth every 6 (six) hours as needed., Disp: , Rfl:    Acetylcarnitine HCl (ACETYL L-CARNITINE PO), Take 2 tablets by mouth 2 (two) times daily., Disp: , Rfl:    Alpha-Lipoic Acid 300 MG CAPS, Take 300 mg by mouth 2 (two) times daily., Disp: , Rfl:    Ascorbic Acid (VITAMIN C) 100 MG tablet, Take 100 mg by mouth daily., Disp: , Rfl:    ASPIRIN  81 PO, aspirin  81 mg tablet,delayed release  Take 1 tablet every day by oral route., Disp: , Rfl:    cetirizine (ZYRTEC) 10 MG tablet, Take 10 mg by mouth daily as needed., Disp: , Rfl:    cholecalciferol  (VITAMIN D3) 25 MCG (1000 UNIT) tablet, Take 1,000 Units by mouth daily., Disp: , Rfl:    cyanocobalamin  (VITAMIN B12) 1000 MCG tablet, Take  1,000 mcg by mouth daily., Disp: , Rfl:    diltiazem  (CARDIZEM  CD) 180 MG 24 hr capsule, Take 180 mg by mouth daily., Disp: , Rfl:    diltiazem  (CARDIZEM  CD) 240 MG 24 hr capsule, Take 1 capsule (240 mg total) by mouth daily. (Patient not taking: Reported on 08/21/2023), Disp: 30 capsule, Rfl: 6   Evolocumab  (REPATHA  SURECLICK) 140 MG/ML SOAJ, INJECT 140 MG INTO THE SKIN EVERY 14 (FOURTEEN) DAYS., Disp: 6 mL, Rfl: 3   ezetimibe (ZETIA) 10 MG tablet, Take 10 mg by mouth daily., Disp: , Rfl: 6   fluticasone (FLONASE) 50 MCG/ACT nasal spray, Place 1 spray into both nostrils daily. As needed, Disp: , Rfl:    montelukast  (SINGULAIR ) 10 MG tablet, TAKE 1 TABLET BY MOUTH EVERYDAY AT BEDTIME (Patient taking differently: As needed), Disp: 90 tablet, Rfl: 3   olmesartan (BENICAR) 20 MG tablet, Take 20 mg by mouth daily., Disp: , Rfl:    Semaglutide, 1 MG/DOSE, (OZEMPIC, 1 MG/DOSE,) 4 MG/3ML SOPN, Inject 1 mg into the skin once a week., Disp: , Rfl:    TURMERIC CURCUMIN PO, Take 500 mg by mouth 3 (three) times daily., Disp: , Rfl:    vitamin E 400 UNIT capsule, Take 400 Units by mouth 2 (two) times daily. , Disp: ,  Rfl:   Allergies:  Allergies  Allergen Reactions   Molds & Smuts Hives   Elderberry Hives   Plegisol Hives and Itching    Past Medical History, Surgical history, Social history, and Family History were reviewed and updated.  Review of Systems: Review of Systems  Constitutional: Negative.  Negative for appetite change, fatigue, fever and unexpected weight change.  HENT:  Negative.  Negative for lump/mass, mouth sores, sore throat and trouble swallowing.   Eyes: Negative.   Respiratory: Negative.  Negative for cough, hemoptysis and shortness of breath.   Cardiovascular: Negative.  Negative for leg swelling and palpitations.  Gastrointestinal: Negative.  Negative for abdominal distention, abdominal pain, blood in stool, constipation, diarrhea, nausea and vomiting.  Endocrine: Negative.    Genitourinary: Negative.  Negative for bladder incontinence, dysuria, frequency and hematuria.   Musculoskeletal: Negative.  Negative for arthralgias, back pain, gait problem and myalgias.  Skin: Negative.  Negative for itching and rash.  Neurological: Negative.  Negative for dizziness, extremity weakness, gait problem, headaches, numbness, seizures and speech difficulty.  Hematological: Negative.  Does not bruise/bleed easily.  Psychiatric/Behavioral: Negative.  Negative for depression and sleep disturbance. The patient is not nervous/anxious.     Physical Exam:  height is 5' 6.5 (1.689 m) and weight is 263 lb (119.3 kg). Her oral temperature is 98.5 F (36.9 C). Her blood pressure is 128/79 and her pulse is 89. Her respiration is 18 and oxygen saturation is 98%.   Wt Readings from Last 3 Encounters:  11/21/23 263 lb (119.3 kg)  08/21/23 260 lb (117.9 kg)  07/23/23 252 lb 9.6 oz (114.6 kg)    Physical Exam Vitals reviewed.  HENT:     Head: Normocephalic and atraumatic.  Eyes:     Pupils: Pupils are equal, round, and reactive to light.  Cardiovascular:     Rate and Rhythm: Normal rate and regular rhythm.     Heart sounds: Normal heart sounds.  Pulmonary:     Effort: Pulmonary effort is normal.     Breath sounds: Normal breath sounds.  Abdominal:     General: Bowel sounds are normal.     Palpations: Abdomen is soft.  Musculoskeletal:        General: No tenderness or deformity. Normal range of motion.     Cervical back: Normal range of motion.  Lymphadenopathy:     Cervical: No cervical adenopathy.  Skin:    General: Skin is warm and dry.     Findings: No erythema or rash.  Neurological:     Mental Status: She is alert and oriented to person, place, and time.  Psychiatric:        Behavior: Behavior normal.        Thought Content: Thought content normal.        Judgment: Judgment normal.      Lab Results  Component Value Date   WBC 7.2 11/21/2023   HGB 13.0  11/21/2023   HCT 40.7 11/21/2023   MCV 94.4 11/21/2023   PLT 249 11/21/2023     Chemistry      Component Value Date/Time   NA 140 11/21/2023 1200   NA 140 12/10/2022 1031   K 4.1 11/21/2023 1200   CL 103 11/21/2023 1200   CO2 26 11/21/2023 1200   BUN 11 11/21/2023 1200   BUN 11 12/10/2022 1031   CREATININE 0.84 11/21/2023 1200   CREATININE 1.19 (H) 08/21/2023 1404      Component Value Date/Time   CALCIUM 10.2  11/21/2023 1200   ALKPHOS 118 11/21/2023 1200   AST 25 11/21/2023 1200   AST 15 08/21/2023 1404   ALT 23 11/21/2023 1200   ALT 15 08/21/2023 1404   BILITOT 0.4 11/21/2023 1200   BILITOT 0.3 08/21/2023 1404      Impression and Plan: Ms. Burchfield is a very charming 73 year old Afro-American female.  She is incredibly eloquent.  She is so delightful to talk to.  Again, I had believe that this MGUS that she has is reactionary.  I do not see anything that looks like actual myeloma.  I would like to get her back in about 3 months.  I think if things are good in 3 months with no changes, then we might be able to move her appointments out for a little longer.  I reassured her that I just do not think that we are dealing with any type of bone marrow issue.  She has strong faith.  We shared our faith.   Maude JONELLE Crease, MD 7/31/202512:48 PM

## 2023-11-22 LAB — KAPPA/LAMBDA LIGHT CHAINS
Kappa free light chain: 35.3 mg/L — ABNORMAL HIGH (ref 3.3–19.4)
Kappa, lambda light chain ratio: 1.23 (ref 0.26–1.65)
Lambda free light chains: 28.7 mg/L — ABNORMAL HIGH (ref 5.7–26.3)

## 2023-11-22 LAB — IGG, IGA, IGM
IgA: 365 mg/dL (ref 64–422)
IgG (Immunoglobin G), Serum: 2027 mg/dL — ABNORMAL HIGH (ref 586–1602)
IgM (Immunoglobulin M), Srm: 60 mg/dL (ref 26–217)

## 2023-11-27 DIAGNOSIS — M79602 Pain in left arm: Secondary | ICD-10-CM | POA: Diagnosis not present

## 2023-11-27 DIAGNOSIS — M47816 Spondylosis without myelopathy or radiculopathy, lumbar region: Secondary | ICD-10-CM | POA: Diagnosis not present

## 2023-11-27 LAB — PROTEIN ELECTROPHORESIS, SERUM, WITH REFLEX
A/G Ratio: 1 (ref 0.7–1.7)
Albumin ELP: 3.8 g/dL (ref 2.9–4.4)
Alpha-1-Globulin: 0.2 g/dL (ref 0.0–0.4)
Alpha-2-Globulin: 0.6 g/dL (ref 0.4–1.0)
Beta Globulin: 1.3 g/dL (ref 0.7–1.3)
Gamma Globulin: 1.8 g/dL (ref 0.4–1.8)
Globulin, Total: 3.9 g/dL (ref 2.2–3.9)
Total Protein ELP: 7.7 g/dL (ref 6.0–8.5)

## 2023-11-28 ENCOUNTER — Ambulatory Visit
Admission: RE | Admit: 2023-11-28 | Discharge: 2023-11-28 | Disposition: A | Discharge status: Home / Self Care | Source: Ambulatory Visit | Attending: Acute Care | Admitting: Acute Care

## 2023-11-28 DIAGNOSIS — Z87891 Personal history of nicotine dependence: Secondary | ICD-10-CM | POA: Diagnosis not present

## 2023-11-28 DIAGNOSIS — Z122 Encounter for screening for malignant neoplasm of respiratory organs: Secondary | ICD-10-CM

## 2023-12-11 ENCOUNTER — Other Ambulatory Visit: Payer: Self-pay | Admitting: Acute Care

## 2023-12-11 DIAGNOSIS — Z122 Encounter for screening for malignant neoplasm of respiratory organs: Secondary | ICD-10-CM

## 2023-12-11 DIAGNOSIS — Z87891 Personal history of nicotine dependence: Secondary | ICD-10-CM

## 2023-12-17 ENCOUNTER — Other Ambulatory Visit: Payer: Self-pay | Admitting: Physician Assistant

## 2023-12-17 DIAGNOSIS — L410 Pityriasis lichenoides et varioliformis acuta: Secondary | ICD-10-CM | POA: Diagnosis not present

## 2023-12-17 DIAGNOSIS — L309 Dermatitis, unspecified: Secondary | ICD-10-CM | POA: Diagnosis not present

## 2023-12-17 DIAGNOSIS — L6681 Central centrifugal cicatricial alopecia: Secondary | ICD-10-CM | POA: Diagnosis not present

## 2023-12-18 LAB — DERMATOLOGY PATHOLOGY

## 2024-01-09 DIAGNOSIS — M47816 Spondylosis without myelopathy or radiculopathy, lumbar region: Secondary | ICD-10-CM | POA: Diagnosis not present

## 2024-01-12 DIAGNOSIS — M5459 Other low back pain: Secondary | ICD-10-CM | POA: Diagnosis not present

## 2024-01-27 DIAGNOSIS — M5459 Other low back pain: Secondary | ICD-10-CM | POA: Diagnosis not present

## 2024-01-29 ENCOUNTER — Other Ambulatory Visit: Payer: Self-pay | Admitting: Family Medicine

## 2024-01-29 DIAGNOSIS — Z1231 Encounter for screening mammogram for malignant neoplasm of breast: Secondary | ICD-10-CM

## 2024-02-06 DIAGNOSIS — E1165 Type 2 diabetes mellitus with hyperglycemia: Secondary | ICD-10-CM | POA: Diagnosis not present

## 2024-02-06 DIAGNOSIS — I10 Essential (primary) hypertension: Secondary | ICD-10-CM | POA: Diagnosis not present

## 2024-02-06 DIAGNOSIS — E1142 Type 2 diabetes mellitus with diabetic polyneuropathy: Secondary | ICD-10-CM | POA: Diagnosis not present

## 2024-02-06 DIAGNOSIS — R809 Proteinuria, unspecified: Secondary | ICD-10-CM | POA: Diagnosis not present

## 2024-02-06 DIAGNOSIS — D509 Iron deficiency anemia, unspecified: Secondary | ICD-10-CM | POA: Diagnosis not present

## 2024-02-06 DIAGNOSIS — E7800 Pure hypercholesterolemia, unspecified: Secondary | ICD-10-CM | POA: Diagnosis not present

## 2024-02-11 DIAGNOSIS — M47816 Spondylosis without myelopathy or radiculopathy, lumbar region: Secondary | ICD-10-CM | POA: Diagnosis not present

## 2024-02-11 DIAGNOSIS — M545 Low back pain, unspecified: Secondary | ICD-10-CM | POA: Diagnosis not present

## 2024-02-19 DIAGNOSIS — L410 Pityriasis lichenoides et varioliformis acuta: Secondary | ICD-10-CM | POA: Diagnosis not present

## 2024-02-19 DIAGNOSIS — L6681 Central centrifugal cicatricial alopecia: Secondary | ICD-10-CM | POA: Diagnosis not present

## 2024-02-21 DIAGNOSIS — K219 Gastro-esophageal reflux disease without esophagitis: Secondary | ICD-10-CM | POA: Diagnosis not present

## 2024-02-24 ENCOUNTER — Other Ambulatory Visit (HOSPITAL_COMMUNITY): Payer: Self-pay

## 2024-02-24 ENCOUNTER — Telehealth: Payer: Self-pay | Admitting: Cardiology

## 2024-02-24 DIAGNOSIS — E785 Hyperlipidemia, unspecified: Secondary | ICD-10-CM

## 2024-02-24 NOTE — Telephone Encounter (Signed)
 Pharmacy Patient Advocate Encounter   Received notification from Physician's Office that prior authorization for REPATHA  is required/requested.   Insurance verification completed.   The patient is insured through [facility] ADVANTAGE/RX ADVANCE.   Per test claim: PA required; PA submitted to above mentioned insurance via Latent Key/confirmation #/EOC [id number] Status is pending

## 2024-02-24 NOTE — Telephone Encounter (Signed)
 Pt called stating she needed a PA for medication and is currently out of medication.  Evolocumab  (REPATHA  SURECLICK) 140 MG/ML SOAJ please advise

## 2024-02-24 NOTE — Telephone Encounter (Signed)
 Called and spoke to pt. She was due for Repatha  on 02/23/2024, but her pharmacy will not refill due to needing a Prior Auth. She has been on med for last 3-4 months, taking it every 2 weeks.   She needs to know how many days is okay for her to miss, and would she need to take the same amount/dose when restarting.

## 2024-02-25 ENCOUNTER — Other Ambulatory Visit: Payer: Self-pay | Admitting: Cardiology

## 2024-02-25 ENCOUNTER — Other Ambulatory Visit: Payer: Self-pay | Admitting: Student

## 2024-02-26 NOTE — Telephone Encounter (Signed)
   PA form is indicating request may not be approved if labs are older than 120 days from submission. Please advise.

## 2024-03-02 NOTE — Telephone Encounter (Signed)
 Needs updated labs for PA; unable to [facility]

## 2024-03-05 ENCOUNTER — Telehealth: Payer: Self-pay | Admitting: Pharmacy Technician

## 2024-03-05 ENCOUNTER — Encounter: Payer: Self-pay | Admitting: Cardiovascular Disease

## 2024-03-05 ENCOUNTER — Other Ambulatory Visit (HOSPITAL_COMMUNITY): Payer: Self-pay

## 2024-03-05 NOTE — Telephone Encounter (Signed)
 Pt calling to f/u on pre authorization for Repatha  and was frustrated no one had reached out. Made pt aware office has been trying to reach out and unable to [facility]. Gave pt info from 11/3 phone note. Pt made aware she will need labs for preauth, she will go as soon as possible.

## 2024-03-05 NOTE — Telephone Encounter (Signed)
   Pharmacy Patient Advocate Encounter   Received notification from Pt Calls Messages that prior authorization for REPATHA  is required/requested.   Insurance verification completed.   The patient is insured through [facility] ADVANTAGE/RX ADVANCE.   Per test claim: PA required; PA submitted to above mentioned insurance via Latent Key/confirmation #/EOC [id number] Status is pending

## 2024-03-05 NOTE — Telephone Encounter (Signed)
 Pharmacy Patient Advocate Encounter  Received notification from [employer] that Prior Authorization for repatha   has been APPROVED from 03/05/24 to 03/05/25   PA #/Case ID/Reference #: [id number]

## 2024-03-05 NOTE — Telephone Encounter (Signed)
 Error

## 2024-03-06 ENCOUNTER — Ambulatory Visit
Admission: RE | Admit: 2024-03-06 | Discharge: 2024-03-06 | Disposition: A | Discharge status: Home / Self Care | Source: Ambulatory Visit | Attending: Family Medicine | Admitting: Family Medicine

## 2024-03-06 DIAGNOSIS — Z1231 Encounter for screening mammogram for malignant neoplasm of breast: Secondary | ICD-10-CM | POA: Diagnosis not present

## 2024-03-07 LAB — LIPID PANEL
Chol/HDL Ratio: 2.9 ratio (ref 0.0–4.4)
Cholesterol, Total: 173 mg/dL (ref 100–199)
HDL: 60 mg/dL (ref >39)
LDL Chol Calc (NIH): 98 mg/dL (ref 0–99)
Triglycerides: 81 mg/dL (ref 0–149)
VLDL Cholesterol Cal: 15 mg/dL (ref 5–40)

## 2024-03-10 ENCOUNTER — Ambulatory Visit: Payer: Self-pay | Admitting: Pharmacist Clinician (PhC)/ Clinical Pharmacy Specialist

## 2024-03-11 DIAGNOSIS — M48062 Spinal stenosis, lumbar region with neurogenic claudication: Secondary | ICD-10-CM | POA: Diagnosis not present

## 2024-03-12 DIAGNOSIS — E1165 Type 2 diabetes mellitus with hyperglycemia: Secondary | ICD-10-CM | POA: Diagnosis not present

## 2024-03-12 DIAGNOSIS — M5442 Lumbago with sciatica, left side: Secondary | ICD-10-CM | POA: Diagnosis not present

## 2024-03-12 DIAGNOSIS — I10 Essential (primary) hypertension: Secondary | ICD-10-CM | POA: Diagnosis not present

## 2024-03-12 DIAGNOSIS — G8929 Other chronic pain: Secondary | ICD-10-CM | POA: Diagnosis not present

## 2024-03-26 ENCOUNTER — Other Ambulatory Visit: Payer: Self-pay

## 2024-03-26 ENCOUNTER — Inpatient Hospital Stay: Admitting: Hematology & Oncology

## 2024-03-26 ENCOUNTER — Inpatient Hospital Stay: Attending: Hematology & Oncology

## 2024-03-26 ENCOUNTER — Encounter: Payer: Self-pay | Admitting: Hematology & Oncology

## 2024-03-26 VITALS — BP 136/87 | HR 94 | Temp 97.9°F | Resp 19 | Ht 66.5 in | Wt 266.0 lb

## 2024-03-26 DIAGNOSIS — M549 Dorsalgia, unspecified: Secondary | ICD-10-CM | POA: Diagnosis not present

## 2024-03-26 DIAGNOSIS — Z7982 Long term (current) use of aspirin: Secondary | ICD-10-CM | POA: Diagnosis not present

## 2024-03-26 DIAGNOSIS — Z79624 Long term (current) use of inhibitors of nucleotide synthesis: Secondary | ICD-10-CM | POA: Insufficient documentation

## 2024-03-26 DIAGNOSIS — Z79899 Other long term (current) drug therapy: Secondary | ICD-10-CM | POA: Insufficient documentation

## 2024-03-26 DIAGNOSIS — M4316 Spondylolisthesis, lumbar region: Secondary | ICD-10-CM | POA: Diagnosis not present

## 2024-03-26 DIAGNOSIS — D472 Monoclonal gammopathy: Secondary | ICD-10-CM | POA: Insufficient documentation

## 2024-03-26 LAB — CMP (CANCER CENTER ONLY)
ALT: 25 U/L (ref 0–44)
AST: 23 U/L (ref 15–41)
Albumin: 4.1 g/dL (ref 3.5–5.0)
Alkaline Phosphatase: 101 U/L (ref 38–126)
Anion gap: 9 (ref 5–15)
BUN: 16 mg/dL (ref 8–23)
CO2: 27 mmol/L (ref 22–32)
Calcium: 9.6 mg/dL (ref 8.9–10.3)
Chloride: 106 mmol/L (ref 98–111)
Creatinine: 0.76 mg/dL (ref 0.44–1.00)
GFR, Estimated: >60 mL/min (ref >60)
Glucose, Bld: 121 mg/dL — ABNORMAL HIGH (ref 70–99)
Potassium: 4 mmol/L (ref 3.5–5.1)
Sodium: 142 mmol/L (ref 135–145)
Total Bilirubin: 0.5 mg/dL (ref 0.0–1.2)
Total Protein: 7.7 g/dL (ref 6.5–8.1)

## 2024-03-26 LAB — SAVE SMEAR(SSMR), FOR PROVIDER SLIDE REVIEW

## 2024-03-26 LAB — CBC WITH DIFFERENTIAL (CANCER CENTER ONLY)
Abs Immature Granulocytes: 0.02 K/uL (ref 0.00–0.07)
Basophils Absolute: 0.1 K/uL (ref 0.0–0.1)
Basophils Relative: 1 %
Eosinophils Absolute: 0.1 K/uL (ref 0.0–0.5)
Eosinophils Relative: 1 %
HCT: 41 % (ref 36.0–46.0)
Hemoglobin: 13.6 g/dL (ref 12.0–15.0)
Immature Granulocytes: 0 %
Lymphocytes Relative: 32 %
Lymphs Abs: 2.1 K/uL (ref 0.7–4.0)
MCH: 31.6 pg (ref 26.0–34.0)
MCHC: 33.2 g/dL (ref 30.0–36.0)
MCV: 95.1 fL (ref 80.0–100.0)
Monocytes Absolute: 0.7 K/uL (ref 0.1–1.0)
Monocytes Relative: 10 %
Neutro Abs: 3.6 K/uL (ref 1.7–7.7)
Neutrophils Relative %: 56 %
Platelet Count: 246 K/uL (ref 150–400)
RBC: 4.31 MIL/uL (ref 3.87–5.11)
RDW: 15.1 % (ref 11.5–15.5)
WBC Count: 6.5 K/uL (ref 4.0–10.5)
nRBC: 0 % (ref 0.0–0.2)

## 2024-03-26 LAB — LACTATE DEHYDROGENASE: LDH: 188 U/L (ref 105–235)

## 2024-03-26 NOTE — Progress Notes (Signed)
 Hematology and Oncology Follow Up Visit  Stephanie Fox [id number] 02-17-1951 73 y.o. 03/26/2024   Principle Diagnosis:  IgG kappa MGUS  Current Therapy:   Observation     Interim History:  Stephanie Fox is back for follow-up.  She is doing quite well.  The real problem that she has is her back.  She actually saw the neurosurgeon today.  It sounds like she is going need to have surgery at L4-L5.  I am not sure when surgery will be.  From my point of view, I do not see a problem with her having a surgery.  When we last saw her, there was no monoclonal spike in her blood.  We did a fairly thorough evaluation of the past history of monoclonal myopathy.  So far, we have not found anything that looks significant.  She has had no problems with cough or shortness of breath.  There is been no problems infections.  She has had no change in bowel or bladder habits.  When we last saw her, her IgG level was 2027 mg/dL.  Her kappa light chain 3.5 mg/dL.  Currently, I would have to say that her performance status is probably ECOG 0.  Medications:  Current Outpatient Medications:    acetaminophen  (TYLENOL ) 500 MG tablet, Take 500 mg by mouth every 6 (six) hours as needed., Disp: , Rfl:    Acetylcarnitine HCl (ACETYL L-CARNITINE PO), Take 2 tablets by mouth 2 (two) times daily., Disp: , Rfl:    Alpha-Lipoic Acid 300 MG CAPS, Take 300 mg by mouth 2 (two) times daily., Disp: , Rfl:    Ascorbic Acid (VITAMIN C) 100 MG tablet, Take 100 mg by mouth daily., Disp: , Rfl:    ASPIRIN  81 PO, aspirin  81 mg tablet,delayed release  Take 1 tablet every day by oral route., Disp: , Rfl:    cetirizine (ZYRTEC) 10 MG tablet, Take 10 mg by mouth daily as needed., Disp: , Rfl:    cholecalciferol  (VITAMIN D3) 25 MCG (1000 UNIT) tablet, Take 1,000 Units by mouth daily., Disp: , Rfl:    cyanocobalamin  (VITAMIN B12) 1000 MCG tablet, Take 1,000 mcg by mouth daily., Disp: , Rfl:    diltiazem  (CARDIZEM  CD) 180 MG 24 hr capsule, TAKE  1 CAPSULE BY MOUTH EVERY DAY, Disp: 90 capsule, Rfl: 1   diltiazem  (CARDIZEM  CD) 240 MG 24 hr capsule, Take 1 capsule (240 mg total) by mouth daily. (Patient not taking: Reported on 08/21/2023), Disp: 30 capsule, Rfl: 6   Evolocumab  (REPATHA  SURECLICK) 140 MG/ML SOAJ, INJECT 140 MG INTO THE SKIN EVERY 14 (FOURTEEN) DAYS., Disp: 6 mL, Rfl: 3   ezetimibe (ZETIA) 10 MG tablet, Take 10 mg by mouth daily., Disp: , Rfl: 6   fluticasone (FLONASE) 50 MCG/ACT nasal spray, Place 1 spray into both nostrils daily. As needed, Disp: , Rfl:    montelukast  (SINGULAIR ) 10 MG tablet, TAKE 1 TABLET BY MOUTH EVERYDAY AT BEDTIME (Patient taking differently: As needed), Disp: 90 tablet, Rfl: 3   olmesartan (BENICAR) 20 MG tablet, Take 20 mg by mouth daily., Disp: , Rfl:    Semaglutide, 1 MG/DOSE, (OZEMPIC, 1 MG/DOSE,) 4 MG/3ML SOPN, Inject 1 mg into the skin once a week., Disp: , Rfl:    TURMERIC CURCUMIN PO, Take 500 mg by mouth 3 (three) times daily., Disp: , Rfl:    valACYclovir (VALTREX) 500 MG tablet, Take 500 mg by mouth daily., Disp: , Rfl:    vitamin E 400 UNIT capsule, Take 400 Units by  mouth 2 (two) times daily. , Disp: , Rfl:   Allergies:  Allergies  Allergen Reactions   Molds & Smuts Hives   Elderberry Hives   Plegisol Hives and Itching    Past Medical History, Surgical history, Social history, and Family History were reviewed and updated.  Review of Systems: Review of Systems  Constitutional: Negative.  Negative for appetite change, fatigue, fever and unexpected weight change.  HENT:  Negative.  Negative for lump/mass, mouth sores, sore throat and trouble swallowing.   Eyes: Negative.   Respiratory: Negative.  Negative for cough, hemoptysis and shortness of breath.   Cardiovascular: Negative.  Negative for leg swelling and palpitations.  Gastrointestinal: Negative.  Negative for abdominal distention, abdominal pain, blood in stool, constipation, diarrhea, nausea and vomiting.  Endocrine:  Negative.   Genitourinary: Negative.  Negative for bladder incontinence, dysuria, frequency and hematuria.   Musculoskeletal: Negative.  Negative for arthralgias, back pain, gait problem and myalgias.  Skin: Negative.  Negative for itching and rash.  Neurological: Negative.  Negative for dizziness, extremity weakness, gait problem, headaches, numbness, seizures and speech difficulty.  Hematological: Negative.  Does not bruise/bleed easily.  Psychiatric/Behavioral: Negative.  Negative for depression and sleep disturbance. The patient is not nervous/anxious.     Physical Exam:  Vital signs show temperature of 97.9.  Pulse 94.  Blood pressure 136/87.  Weight is 266 pounds.  Wt Readings from Last 3 Encounters:  11/21/23 263 lb (119.3 kg)  08/21/23 260 lb (117.9 kg)  07/23/23 252 lb 9.6 oz (114.6 kg)    Physical Exam Vitals reviewed.  HENT:     Head: Normocephalic and atraumatic.  Eyes:     Pupils: Pupils are equal, round, and reactive to light.  Cardiovascular:     Rate and Rhythm: Normal rate and regular rhythm.     Heart sounds: Normal heart sounds.     Comments: Cardiac exam shows a regular rate and rhythm.  She has an occasional skipped beat.  I do not hear any murmurs, rubs or bruits. Pulmonary:     Effort: Pulmonary effort is normal.     Breath sounds: Normal breath sounds.  Abdominal:     General: Bowel sounds are normal.     Palpations: Abdomen is soft.  Musculoskeletal:        General: No tenderness or deformity. Normal range of motion.     Cervical back: Normal range of motion.  Lymphadenopathy:     Cervical: No cervical adenopathy.  Skin:    General: Skin is warm and dry.     Findings: No erythema or rash.  Neurological:     Mental Status: She is alert and oriented to person, place, and time.  Psychiatric:        Behavior: Behavior normal.        Thought Content: Thought content normal.        Judgment: Judgment normal.     Lab Results  Component Value Date    WBC 6.5 03/26/2024   HGB 13.6 03/26/2024   HCT 41.0 03/26/2024   MCV 95.1 03/26/2024   PLT 246 03/26/2024     Chemistry      Component Value Date/Time   NA 140 11/21/2023 1200   NA 140 12/10/2022 1031   K 4.1 11/21/2023 1200   CL 103 11/21/2023 1200   CO2 26 11/21/2023 1200   BUN 11 11/21/2023 1200   BUN 11 12/10/2022 1031   CREATININE 0.84 11/21/2023 1200   CREATININE 1.19 (H) 08/21/2023  1404      Component Value Date/Time   CALCIUM 10.2 11/21/2023 1200   ALKPHOS 118 11/21/2023 1200   AST 25 11/21/2023 1200   AST 15 08/21/2023 1404   ALT 23 11/21/2023 1200   ALT 15 08/21/2023 1404   BILITOT 0.4 11/21/2023 1200   BILITOT 0.3 08/21/2023 1404      Impression and Plan: Stephanie Fox is a very charming 55 year old Afro-American female.  She is incredibly eloquent.  She is so delightful to talk to.  We will see what her monoclonal studies look like.  Again, since we have been seeing her we have not found any evidence of a monoclonal dermopathy.  Again, if she needs back surgery, I do not see a problem with this.  I know that on my exam, with her cardiac exam she did have a skipped beat.  I am not sure exactly if this to be considered pathologic.  I told her that when she has her preop evaluation, I am sure that an EKG will be done.  We will plan to get her back after the Winter.  I think that would be reasonable. Stephanie Fox   Maude JONELLE Crease, MD 12/4/20252:00 PM

## 2024-03-27 LAB — KAPPA/LAMBDA LIGHT CHAINS
Kappa free light chain: 30.9 mg/L — ABNORMAL HIGH (ref 3.3–19.4)
Kappa, lambda light chain ratio: 1.21 (ref 0.26–1.65)
Lambda free light chains: 25.6 mg/L (ref 5.7–26.3)

## 2024-03-27 LAB — IGG, IGA, IGM
IgA: 355 mg/dL (ref 64–422)
IgG (Immunoglobin G), Serum: 1961 mg/dL — ABNORMAL HIGH (ref 586–1602)
IgM (Immunoglobulin M), Srm: 48 mg/dL (ref 26–217)

## 2024-03-27 LAB — BETA 2 MICROGLOBULIN, SERUM: Beta-2 Microglobulin: 1.5 mg/L (ref 0.6–2.4)

## 2024-03-29 LAB — PROTEIN ELECTROPHORESIS, SERUM, WITH REFLEX
A/G Ratio: 0.9 (ref 0.7–1.7)
Albumin ELP: 3.6 g/dL (ref 2.9–4.4)
Alpha-1-Globulin: 0.2 g/dL (ref 0.0–0.4)
Alpha-2-Globulin: 0.6 g/dL (ref 0.4–1.0)
Beta Globulin: 1.2 g/dL (ref 0.7–1.3)
Gamma Globulin: 1.8 g/dL (ref 0.4–1.8)
Globulin, Total: 3.8 g/dL (ref 2.2–3.9)
Total Protein ELP: 7.4 g/dL (ref 6.0–8.5)

## 2024-03-30 ENCOUNTER — Ambulatory Visit: Payer: Self-pay | Admitting: Hematology & Oncology

## 2024-04-08 ENCOUNTER — Encounter (HOSPITAL_BASED_OUTPATIENT_CLINIC_OR_DEPARTMENT_OTHER): Payer: Self-pay

## 2024-04-08 ENCOUNTER — Ambulatory Visit (HOSPITAL_BASED_OUTPATIENT_CLINIC_OR_DEPARTMENT_OTHER): Admitting: Physical Therapy

## 2024-07-10 ENCOUNTER — Inpatient Hospital Stay: Attending: Hematology & Oncology

## 2024-07-10 ENCOUNTER — Inpatient Hospital Stay: Admitting: Hematology & Oncology
